# Patient Record
Sex: Female | Born: 1950 | Race: Black or African American | Hispanic: No | Smoking: Current some day smoker
Health system: Southern US, Community
[De-identification: ages and names within clinical notes are randomized; demographics above are authoritative.]

## PROBLEM LIST (undated history)

## (undated) DIAGNOSIS — I639 Cerebral infarction, unspecified: Secondary | ICD-10-CM

## (undated) DIAGNOSIS — F17210 Nicotine dependence, cigarettes, uncomplicated: Secondary | ICD-10-CM

## (undated) DIAGNOSIS — I10 Essential (primary) hypertension: Secondary | ICD-10-CM

## (undated) HISTORY — PX: APPENDECTOMY: SHX54

## (undated) HISTORY — PX: ABDOMINAL HYSTERECTOMY: SHX81

## (undated) HISTORY — DX: Cerebral infarction, unspecified: I63.9

## (undated) HISTORY — PX: TUBAL LIGATION: SHX77

---

## 1999-08-13 ENCOUNTER — Emergency Department (HOSPITAL_COMMUNITY): Admission: EM | Admit: 1999-08-13 | Discharge: 1999-08-13 | Payer: Self-pay | Admitting: Emergency Medicine

## 1999-08-13 ENCOUNTER — Encounter: Payer: Self-pay | Admitting: Emergency Medicine

## 1999-08-16 ENCOUNTER — Observation Stay (HOSPITAL_COMMUNITY): Admission: EM | Admit: 1999-08-16 | Discharge: 1999-08-16 | Payer: Self-pay | Admitting: Emergency Medicine

## 2000-10-23 ENCOUNTER — Emergency Department (HOSPITAL_COMMUNITY): Admission: EM | Admit: 2000-10-23 | Discharge: 2000-10-23 | Payer: Self-pay | Admitting: Emergency Medicine

## 2001-03-11 ENCOUNTER — Encounter: Payer: Self-pay | Admitting: Emergency Medicine

## 2001-03-11 ENCOUNTER — Emergency Department (HOSPITAL_COMMUNITY): Admission: EM | Admit: 2001-03-11 | Discharge: 2001-03-11 | Payer: Self-pay | Admitting: Emergency Medicine

## 2001-07-10 ENCOUNTER — Encounter: Payer: Self-pay | Admitting: *Deleted

## 2001-07-10 ENCOUNTER — Emergency Department (HOSPITAL_COMMUNITY): Admission: EM | Admit: 2001-07-10 | Discharge: 2001-07-10 | Payer: Self-pay | Admitting: *Deleted

## 2003-03-15 ENCOUNTER — Encounter: Payer: Self-pay | Admitting: Urology

## 2003-03-15 ENCOUNTER — Encounter: Admission: RE | Admit: 2003-03-15 | Discharge: 2003-03-15 | Payer: Self-pay | Admitting: Urology

## 2003-04-18 ENCOUNTER — Ambulatory Visit (HOSPITAL_COMMUNITY): Admission: RE | Admit: 2003-04-18 | Discharge: 2003-04-18 | Payer: Self-pay | Admitting: Gastroenterology

## 2003-06-23 ENCOUNTER — Emergency Department (HOSPITAL_COMMUNITY): Admission: EM | Admit: 2003-06-23 | Discharge: 2003-06-23 | Payer: Self-pay | Admitting: Emergency Medicine

## 2006-12-03 ENCOUNTER — Encounter: Admission: RE | Admit: 2006-12-03 | Discharge: 2006-12-03 | Payer: Self-pay | Admitting: Family Medicine

## 2007-12-02 ENCOUNTER — Emergency Department (HOSPITAL_COMMUNITY): Admission: EM | Admit: 2007-12-02 | Discharge: 2007-12-02 | Payer: Self-pay | Admitting: Emergency Medicine

## 2011-03-07 ENCOUNTER — Other Ambulatory Visit: Payer: Self-pay | Admitting: Family Medicine

## 2011-03-07 DIAGNOSIS — Z1231 Encounter for screening mammogram for malignant neoplasm of breast: Secondary | ICD-10-CM

## 2011-03-19 ENCOUNTER — Ambulatory Visit
Admission: RE | Admit: 2011-03-19 | Discharge: 2011-03-19 | Disposition: A | Discharge status: Home / Self Care | Payer: BC Managed Care – PPO | Source: Ambulatory Visit | Attending: Family Medicine | Admitting: Family Medicine

## 2011-03-19 DIAGNOSIS — Z1231 Encounter for screening mammogram for malignant neoplasm of breast: Secondary | ICD-10-CM

## 2011-03-20 ENCOUNTER — Other Ambulatory Visit: Payer: Self-pay | Admitting: Family Medicine

## 2011-03-20 DIAGNOSIS — R928 Other abnormal and inconclusive findings on diagnostic imaging of breast: Secondary | ICD-10-CM

## 2011-03-22 NOTE — Op Note (Signed)
   NAMESHOLANDA, Catherine Floyd                        ACCOUNT NO.:  [id number]   MEDICAL RECORD NO.:  [id number]                   PATIENT TYPE:  AMB   LOCATION:  ENDO                                 FACILITY:  [facility]   PHYSICIAN:  Anselmo Rod, M.D.               DATE OF BIRTH:  Jun 17, 1951   DATE OF PROCEDURE:  04/18/2003  DATE OF DISCHARGE:                                 OPERATIVE REPORT   PROCEDURE:  Colonoscopy.   ENDOSCOPIST:  Anselmo Rod, M.D.   INSTRUMENT USED:  Olympus video colonoscope.   INDICATIONS FOR PROCEDURE:  A 60 year old African-American female with a  history of constipation, abdominal pain, and rectal bleeding off and on for  the last 15 years, rule out colonic polyps, masses, hemorrhoids, etc.   PREPROCEDURE PREPARATION:  Informed consent was procured from the patient.  The patient was fasted for eight hours prior to the procedure and prepped  with a bottle of magnesium citrate and a gallon of GoLYTELY the night prior  to the procedure.   PREPROCEDURE PHYSICAL EXAMINATION:  VITAL SIGNS: Stable.  NECK:  Supple.  CHEST: Clear to auscultation.  S1 and S2 regular.  ABDOMEN:  Soft with normal bowel sounds.   DESCRIPTION OF PROCEDURE:  The patient was placed in the left lateral  decubitus position and sedated with 50 mg of Demerol and 5 mg of Versed  intravenously. Once the patient was adequately sedated and maintained on low  flow oxygen and continuous cardiac monitoring, the Olympus video colonoscope  was advanced into the rectum to the cecum without difficulty.  Prominent  internal hemorrhoids were seen on retroflexion in the rectum. The rest of  the colonic mucosa up to the terminal ileum appeared normal. The appendiceal  orifice and ileocecal valve were clearly visualized and photographed.   IMPRESSION:  Normal colonoscopy up to the terminal ileum except for  prominent inflamed internal hemorrhoids.    RECOMMENDATIONS:  Anusol HC 2.5% suppositories  have been advised one p.r.  q.h.s.  These have been called in to her pharmacy at [facility] on [address].  Outpatient follow-up is advised in the next two weeks for further  recommendations.  Repeat colorectal cancer screening is recommended in the  next five years unless the patient develops any abnormal symptoms in the  interim.                                               Anselmo Rod, M.D.    JNM/MEDQ  D:  04/18/2003  T:  04/19/2003  Job:  [id number]   cc:   Tammy R. Collins [place], M.D.  [address]  [place]  [place] [address]  Fax: [phone]

## 2011-03-22 NOTE — Op Note (Signed)
[place]  Patient:    Catherine Floyd Visit Number: [id number] MRN: [id number]          Service Type: MED Location: [id number] Attending Physician:  Tommy Medal Proc. Date: 08/17/99 Admit Date:  08/16/1999                             Operative Report  INDICATIONS: This 60 year old lady was using a bungie cord which popped back. he metal piece struck her in the left eye causing a corneal laceration.  She was seen with the laceration at [facility] and transferred to [facility] to have this repaired surgically on an outpatient basis.  Otherwise, the right eye was normal and the left eye otherwise appeared intact except for a very significant corneal laceration that is sort of a "V"-shaped and about a total of 1 cm long.  JUSTIFICATION FOR PERFORMING THE PROCEDURE IN AN OUTPATIENT SETTING:  Routine.  JUSTIFICATION FOR OVERNIGHT STAY:  None.  PREOPERATIVE DIAGNOSIS: Corneal laceration of the left eye.  OPERATION PERFORMED:  Repair of left corneal laceration.  SURGEON:  Robert L. Dione Booze, M.D.  ANESTHESIA:  Nurse.  Anesthesia standby with local xylocaine.  DESCRIPTION OF PROCEDURE:  The patient arrived in the operating room at [facility] and was prepped and draped in routine fashion.  A very tiny retrobulbar block of approximately 2 cc was given as was a facial block.  Then % plain xylocaine and Marcaine were used.  After this a lid speculum was positioned and under the operating microscope the eye was carefully examined.  The ophthalmic ______ was injected into the anterior chamber.  In the chamber it seemed to form very nicely.  The pupil was irregular but the iris had not prolapsed.  SInce the laceration was "V"-shaped, it appeared that it would be somewhat difficult to close this.  Each leg of the V was approximately 5 mm long. Initially, a 10-0 nylon suture was  placed in the angle of the V fairly deeply and this caused some distortion but seemed to close the eye fairly well.  After that two additional 10-0 nylon sutures were placed on each leg of the V to close the  wound completely.  Some saline and Miochol were injected into the anterior chamber and the wound held very snugly and the shape of the cornea appeared to be fairly good with minimal distortion.  Thus the closure was felt to be quite adequate.  Subconjunctival Ancef was given and some Polysporin ointment was put in the eye. The eye was then bandaged and the patient went into the recovery room.  FOLLOWUP CARE:  The patient is to be discharged from the recovery room a little  while after the surgery.  She is to call my office to be seen the next day and he eye is to be patched until she is seen.  Further instructions will be given the  next day when the patient is seen for appropriate postoperative drops and care. Attending Physician:  Tommy Medal DD:  08/17/99 TD:  08/19/99 Job: [id number]

## 2011-03-27 ENCOUNTER — Ambulatory Visit
Admission: RE | Admit: 2011-03-27 | Discharge: 2011-03-27 | Disposition: A | Discharge status: Home / Self Care | Payer: BC Managed Care – PPO | Source: Ambulatory Visit | Attending: Family Medicine | Admitting: Family Medicine

## 2011-03-27 DIAGNOSIS — R928 Other abnormal and inconclusive findings on diagnostic imaging of breast: Secondary | ICD-10-CM

## 2011-07-25 LAB — COMPREHENSIVE METABOLIC PANEL
ALT: 33
AST: 17
Albumin: 4
Alkaline Phosphatase: 92
BUN: 6
CO2: 32
Calcium: 9.6
Chloride: 105
Creatinine, Ser: 0.79
GFR calc Af Amer: >60
GFR calc non Af Amer: >60
Glucose, Bld: 106 — ABNORMAL HIGH
Potassium: 2.9 — ABNORMAL LOW
Sodium: 144
Total Bilirubin: 0.5
Total Protein: 7.5

## 2011-07-25 LAB — CBC
HCT: 37.8
Hemoglobin: 13.1
MCHC: 34.6
MCV: 85.1
Platelets: 428 — ABNORMAL HIGH
RBC: 4.44
RDW: 13
WBC: 6

## 2011-07-25 LAB — DIFFERENTIAL
Basophils Absolute: 0
Basophils Relative: 1
Eosinophils Absolute: 0.6
Eosinophils Relative: 10 — ABNORMAL HIGH
Lymphocytes Relative: 35
Lymphs Abs: 2.1
Monocytes Absolute: 0.5
Monocytes Relative: 8
Neutro Abs: 2.8
Neutrophils Relative %: 46

## 2012-05-11 ENCOUNTER — Other Ambulatory Visit: Payer: Self-pay | Admitting: Family Medicine

## 2012-05-11 DIAGNOSIS — Z1231 Encounter for screening mammogram for malignant neoplasm of breast: Secondary | ICD-10-CM

## 2012-05-22 ENCOUNTER — Ambulatory Visit
Admission: RE | Admit: 2012-05-22 | Discharge: 2012-05-22 | Disposition: A | Discharge status: Home / Self Care | Payer: BC Managed Care – PPO | Source: Ambulatory Visit | Attending: Family Medicine | Admitting: Family Medicine

## 2012-05-22 DIAGNOSIS — Z1231 Encounter for screening mammogram for malignant neoplasm of breast: Secondary | ICD-10-CM

## 2013-08-09 ENCOUNTER — Other Ambulatory Visit: Payer: Self-pay | Admitting: Cardiovascular Disease

## 2013-08-10 NOTE — Telephone Encounter (Signed)
Rx was sent to pharmacy electronically. 

## 2014-07-14 ENCOUNTER — Other Ambulatory Visit: Payer: Self-pay

## 2014-07-14 DIAGNOSIS — Z1231 Encounter for screening mammogram for malignant neoplasm of breast: Secondary | ICD-10-CM

## 2014-07-28 ENCOUNTER — Ambulatory Visit
Admission: RE | Admit: 2014-07-28 | Discharge: 2014-07-28 | Disposition: A | Discharge status: Home / Self Care | Payer: BC Managed Care – PPO | Source: Ambulatory Visit

## 2014-07-28 ENCOUNTER — Encounter (INDEPENDENT_AMBULATORY_CARE_PROVIDER_SITE_OTHER): Payer: Self-pay

## 2014-07-28 DIAGNOSIS — Z1231 Encounter for screening mammogram for malignant neoplasm of breast: Secondary | ICD-10-CM

## 2015-06-19 ENCOUNTER — Inpatient Hospital Stay (HOSPITAL_COMMUNITY): Payer: BLUE CROSS/BLUE SHIELD

## 2015-06-19 ENCOUNTER — Inpatient Hospital Stay (HOSPITAL_COMMUNITY)
Admission: EM | Admit: 2015-06-19 | Discharge: 2015-06-22 | DRG: 065 | Disposition: A | Discharge status: Home / Self Care | Payer: BLUE CROSS/BLUE SHIELD | Attending: Internal Medicine | Admitting: Internal Medicine

## 2015-06-19 ENCOUNTER — Emergency Department (HOSPITAL_COMMUNITY): Payer: BLUE CROSS/BLUE SHIELD

## 2015-06-19 ENCOUNTER — Encounter (HOSPITAL_COMMUNITY): Payer: Self-pay | Admitting: Family Medicine

## 2015-06-19 DIAGNOSIS — Z9119 Patient's noncompliance with other medical treatment and regimen: Secondary | ICD-10-CM | POA: Diagnosis present

## 2015-06-19 DIAGNOSIS — Z716 Tobacco abuse counseling: Secondary | ICD-10-CM | POA: Diagnosis present

## 2015-06-19 DIAGNOSIS — I63312 Cerebral infarction due to thrombosis of left middle cerebral artery: Principal | ICD-10-CM | POA: Diagnosis present

## 2015-06-19 DIAGNOSIS — I639 Cerebral infarction, unspecified: Secondary | ICD-10-CM | POA: Diagnosis not present

## 2015-06-19 DIAGNOSIS — E785 Hyperlipidemia, unspecified: Secondary | ICD-10-CM | POA: Diagnosis present

## 2015-06-19 DIAGNOSIS — Z79899 Other long term (current) drug therapy: Secondary | ICD-10-CM | POA: Diagnosis not present

## 2015-06-19 DIAGNOSIS — E876 Hypokalemia: Secondary | ICD-10-CM | POA: Diagnosis present

## 2015-06-19 DIAGNOSIS — I10 Essential (primary) hypertension: Secondary | ICD-10-CM

## 2015-06-19 DIAGNOSIS — R001 Bradycardia, unspecified: Secondary | ICD-10-CM | POA: Diagnosis not present

## 2015-06-19 DIAGNOSIS — F17210 Nicotine dependence, cigarettes, uncomplicated: Secondary | ICD-10-CM | POA: Diagnosis present

## 2015-06-19 DIAGNOSIS — G8191 Hemiplegia, unspecified affecting right dominant side: Secondary | ICD-10-CM | POA: Diagnosis present

## 2015-06-19 DIAGNOSIS — R531 Weakness: Secondary | ICD-10-CM | POA: Diagnosis not present

## 2015-06-19 DIAGNOSIS — Z720 Tobacco use: Secondary | ICD-10-CM

## 2015-06-19 DIAGNOSIS — I739 Peripheral vascular disease, unspecified: Secondary | ICD-10-CM | POA: Diagnosis present

## 2015-06-19 HISTORY — DX: Nicotine dependence, cigarettes, uncomplicated: F17.210

## 2015-06-19 HISTORY — DX: Essential (primary) hypertension: I10

## 2015-06-19 LAB — URINALYSIS, ROUTINE W REFLEX MICROSCOPIC
Bilirubin Urine: NEGATIVE
Glucose, UA: NEGATIVE mg/dL
Ketones, ur: NEGATIVE mg/dL
Leukocytes, UA: NEGATIVE
Nitrite: NEGATIVE
Protein, ur: NEGATIVE mg/dL
Specific Gravity, Urine: 1.011 (ref 1.005–1.030)
Urobilinogen, UA: 1 mg/dL (ref 0.0–1.0)
pH: 6.5 (ref 5.0–8.0)

## 2015-06-19 LAB — RAPID URINE DRUG SCREEN, HOSP PERFORMED
Amphetamines: NOT DETECTED
Barbiturates: NOT DETECTED
Benzodiazepines: NOT DETECTED
Cocaine: NOT DETECTED
Opiates: NOT DETECTED
Tetrahydrocannabinol: NOT DETECTED

## 2015-06-19 LAB — COMPREHENSIVE METABOLIC PANEL
ALT: 12 U/L — ABNORMAL LOW (ref 14–54)
AST: 20 U/L (ref 15–41)
Albumin: 3.8 g/dL (ref 3.5–5.0)
Alkaline Phosphatase: 82 U/L (ref 38–126)
Anion gap: 11 (ref 5–15)
BUN: 8 mg/dL (ref 6–20)
CO2: 30 mmol/L (ref 22–32)
Calcium: 9.6 mg/dL (ref 8.9–10.3)
Chloride: 103 mmol/L (ref 101–111)
Creatinine, Ser: 0.63 mg/dL (ref 0.44–1.00)
GFR calc Af Amer: >60 mL/min (ref >60)
GFR calc non Af Amer: >60 mL/min (ref >60)
Glucose, Bld: 113 mg/dL — ABNORMAL HIGH (ref 65–99)
Potassium: 3.1 mmol/L — ABNORMAL LOW (ref 3.5–5.1)
Sodium: 144 mmol/L (ref 135–145)
Total Bilirubin: 0.3 mg/dL (ref 0.3–1.2)
Total Protein: 6.9 g/dL (ref 6.5–8.1)

## 2015-06-19 LAB — DIFFERENTIAL
Basophils Absolute: 0.1 10*3/uL (ref 0.0–0.1)
Basophils Relative: 1 % (ref 0–1)
Eosinophils Absolute: 0.5 10*3/uL (ref 0.0–0.7)
Eosinophils Relative: 7 % — ABNORMAL HIGH (ref 0–5)
Lymphocytes Relative: 29 % (ref 12–46)
Lymphs Abs: 2.4 10*3/uL (ref 0.7–4.0)
Monocytes Absolute: 0.6 10*3/uL (ref 0.1–1.0)
Monocytes Relative: 8 % (ref 3–12)
Neutro Abs: 4.5 10*3/uL (ref 1.7–7.7)
Neutrophils Relative %: 55 % (ref 43–77)

## 2015-06-19 LAB — CBC
HCT: 40.7 % (ref 36.0–46.0)
Hemoglobin: 13.5 g/dL (ref 12.0–15.0)
MCH: 30.6 pg (ref 26.0–34.0)
MCHC: 33.2 g/dL (ref 30.0–36.0)
MCV: 92.3 fL (ref 78.0–100.0)
Platelets: 343 10*3/uL (ref 150–400)
RBC: 4.41 MIL/uL (ref 3.87–5.11)
RDW: 13.2 % (ref 11.5–15.5)
WBC: 8.1 10*3/uL (ref 4.0–10.5)

## 2015-06-19 LAB — APTT: aPTT: 36 s (ref 24–37)

## 2015-06-19 LAB — I-STAT CHEM 8, ED
BUN: 10 mg/dL (ref 6–20)
Calcium, Ion: 1.16 mmol/L (ref 1.13–1.30)
Chloride: 101 mmol/L (ref 101–111)
Creatinine, Ser: 0.7 mg/dL (ref 0.44–1.00)
Glucose, Bld: 111 mg/dL — ABNORMAL HIGH (ref 65–99)
HCT: 46 % (ref 36.0–46.0)
Hemoglobin: 15.6 g/dL — ABNORMAL HIGH (ref 12.0–15.0)
Potassium: 3.1 mmol/L — ABNORMAL LOW (ref 3.5–5.1)
Sodium: 144 mmol/L (ref 135–145)
TCO2: 30 mmol/L (ref 0–100)

## 2015-06-19 LAB — URINE MICROSCOPIC-ADD ON

## 2015-06-19 LAB — PROTIME-INR
INR: 1.07 (ref 0.00–1.49)
Prothrombin Time: 14.1 s (ref 11.6–15.2)

## 2015-06-19 LAB — I-STAT TROPONIN, ED: Troponin i, poc: 0.01 ng/mL (ref 0.00–0.08)

## 2015-06-19 LAB — MRSA PCR SCREENING: MRSA by PCR: NEGATIVE

## 2015-06-19 MED ORDER — ASPIRIN 300 MG RE SUPP
300.0000 mg | Freq: Every day | RECTAL | Status: DC
  Filled 2015-06-19 (×2): qty 1

## 2015-06-19 MED ORDER — HYDRALAZINE HCL 20 MG/ML IJ SOLN: 10.0000 mg | INTRAMUSCULAR | Status: DC | PRN

## 2015-06-19 MED ORDER — ENOXAPARIN SODIUM 30 MG/0.3ML ~~LOC~~ SOLN
30.0000 mg | SUBCUTANEOUS | Status: DC
  Administered 2015-06-19 – 2015-06-21 (×3): 30 mg via SUBCUTANEOUS
  Filled 2015-06-19 (×4): qty 0.3

## 2015-06-19 MED ORDER — ONDANSETRON HCL 4 MG/2ML IJ SOLN: 4.0000 mg | Freq: Three times a day (TID) | INTRAMUSCULAR | Status: AC | PRN

## 2015-06-19 MED ORDER — ATORVASTATIN CALCIUM 80 MG PO TABS
80.0000 mg | ORAL_TABLET | Freq: Every day | ORAL | Status: DC
  Administered 2015-06-19 – 2015-06-21 (×3): 80 mg via ORAL
  Filled 2015-06-19 (×3): qty 1

## 2015-06-19 MED ORDER — SENNOSIDES-DOCUSATE SODIUM 8.6-50 MG PO TABS
1.0000 | ORAL_TABLET | Freq: Every evening | ORAL | Status: DC | PRN
  Filled 2015-06-19: qty 1

## 2015-06-19 MED ORDER — ACETAMINOPHEN 325 MG PO TABS: 650.0000 mg | ORAL_TABLET | ORAL | Status: DC | PRN

## 2015-06-19 MED ORDER — ACETAMINOPHEN 650 MG RE SUPP: 650.0000 mg | RECTAL | Status: DC | PRN

## 2015-06-19 MED ORDER — ASPIRIN 325 MG PO TABS
325.0000 mg | ORAL_TABLET | Freq: Every day | ORAL | Status: DC
  Administered 2015-06-19 – 2015-06-22 (×4): 325 mg via ORAL
  Filled 2015-06-19 (×4): qty 1

## 2015-06-19 MED ORDER — STROKE: EARLY STAGES OF RECOVERY BOOK
Freq: Once | Status: DC
  Filled 2015-06-19: qty 1

## 2015-06-19 MED ORDER — NICOTINE 21 MG/24HR TD PT24
21.0000 mg | MEDICATED_PATCH | Freq: Every day | TRANSDERMAL | Status: DC
  Administered 2015-06-20 – 2015-06-22 (×3): 21 mg via TRANSDERMAL
  Filled 2015-06-19 (×4): qty 1

## 2015-06-19 NOTE — Consult Note (Signed)
Referring Physician: Short    Chief Complaint: Right sided weakness  HPI: Catherine Floyd is an 64 y.o. RH female who reports that she went to bed last night and was at her baseline.  On awakening this morning she noted right sided weakness and difficulty walking.  She does not describe difficulty with speech or vision.  Symptoms have not worsened.  The patient has been under quite a bit of stress recently and has not been compliant with her antihypertensives.    Date last known well: Date: 06/18/2015 Time last known well: Time: 23:00 tPA Given: No: Outside time window  Past Medical History  Diagnosis Date  . Hypertension   . Cigarette nicotine dependence     Past Surgical History  Procedure Laterality Date  . Abdominal hysterectomy    . Appendectomy    . Tubal ligation      Family History  Problem Relation Age of Onset  . Stroke Father   . Bladder Cancer Mother   . High blood pressure Father   . Diabetes Son    Social History:  reports that she has been smoking Cigarettes.  She has been smoking about 1.00 pack per day. Her smokeless tobacco use includes Chew. She reports that she does not drink alcohol or use illicit drugs.  Allergies:  Allergies  Allergen Reactions  . Penicillins   . Sulfa Antibiotics     Medications: I have reviewed the patient's current medications. Prior to Admission:  Prior to Admission medications   Medication Sig Start Date End Date Taking? Authorizing Provider  Aspirin-Acetaminophen (GOODYS BODY PAIN PO) Take 1 packet by mouth every 8 (eight) hours as needed (pain).   Yes Historical Provider, MD  doxazosin (CARDURA) 4 MG tablet Take 4 mg by mouth daily. 05/18/15  Yes Historical Provider, MD  hydrochlorothiazide (HYDRODIURIL) 12.5 MG tablet Take 12.5 mg by mouth daily. 05/18/15  Yes Historical Provider, MD  metoprolol succinate (TOPROL-XL) 50 MG 24 hr tablet Take 50 mg by mouth daily. 05/18/15  Yes Historical Provider, MD  Multiple Vitamin  (MULTIVITAMIN) tablet Take 1 tablet by mouth daily.   Yes Historical Provider, MD  nisoldipine (SULAR) 20 MG 24 hr tablet Take 20 mg by mouth daily. 05/18/15  Yes Historical Provider, MD    ROS: History obtained from the patient  General ROS: negative for - chills, fatigue, fever, night sweats, weight gain or weight loss Psychological ROS: negative for - behavioral disorder, hallucinations, memory difficulties, mood swings or suicidal ideation Ophthalmic ROS: negative for - blurry vision, double vision, eye pain or loss of vision ENT ROS: negative for - epistaxis, nasal discharge, oral lesions, sore throat, tinnitus or vertigo Allergy and Immunology ROS: negative for - hives or itchy/watery eyes Hematological and Lymphatic ROS: negative for - bleeding problems, bruising or swollen lymph nodes Endocrine ROS: negative for - galactorrhea, hair pattern changes, polydipsia/polyuria or temperature intolerance Respiratory ROS: negative for - cough, hemoptysis, shortness of breath or wheezing Cardiovascular ROS: negative for - chest pain, dyspnea on exertion, edema or irregular heartbeat Gastrointestinal ROS: negative for - abdominal pain, diarrhea, hematemesis, nausea/vomiting or stool incontinence Genito-Urinary ROS: negative for - dysuria, hematuria, incontinence or urinary frequency/urgency Musculoskeletal ROS: negative for - joint swelling or muscular weakness Neurological ROS: as noted in HPI Dermatological ROS: negative for rash and skin lesion changes  Physical Examination: Blood pressure 215/98, pulse 67, temperature 98.7 F (37.1 C), temperature source Oral, resp. rate 19, height 5' 2.5" (1.588 m), weight 61.916 kg (136 lb  8 oz), SpO2 100 %.  Gen: NAD HEENT-  Normocephalic, no lesions, without obvious abnormality.  Normal external eye and conjunctiva.  Normal TM's bilaterally.  Normal auditory canals and external ears. Normal external nose, mucus membranes and septum.  Normal  pharynx. Cardiovascular- S1, S2 normal, pulses palpable throughout   Lungs- chest clear, no wheezing, rales, normal symmetric air entry Abdomen- soft, non-tender; bowel sounds normal; no masses,  no organomegaly Extremities- no edema Lymph-no adenopathy palpable Musculoskeletal-no joint tenderness, deformity or swelling Skin-warm and dry, no hyperpigmentation, vitiligo, or suspicious lesions  Neurological Examination Mental Status: Alert, oriented, thought content appropriate.  Speech fluent without evidence of aphasia.  Able to follow 3 step commands without difficulty. Cranial Nerves: II: Discs flat bilaterally; Visual fields grossly normal, pupils equal, round, reactive to light and accommodation III,IV, VI: ptosis not present, extra-ocular motions intact bilaterally V,VII: smile symmetric, facial light touch sensation normal bilaterally VIII: hearing normal bilaterally IX,X: gag reflex present XI: bilateral shoulder shrug XII: midline tongue extension Motor: Right : Upper extremity   5-/5    Left:     Upper extremity   5/5  Lower extremity   5-/5     Lower extremity   5/5 Tone and bulk:normal tone throughout; no atrophy noted Sensory: Pinprick and light touch intact throughout, bilaterally Deep Tendon Reflexes: 2+ and symmetric with absent AJ's bilaterally Plantars: Right: downgoing   Left: downgoing Cerebellar: Dysmetria with finger-to-nose and heel-to-shin testing on the right Gait: not tested due to safety concerns   Laboratory Studies:  Basic Metabolic Panel:  Recent Labs Lab 06/19/15 1308 06/19/15 1323  NA 144 144  K 3.1* 3.1*  CL 103 101  CO2 30  --   GLUCOSE 113* 111*  BUN 8 10  CREATININE 0.63 0.70  CALCIUM 9.6  --     Liver Function Tests:  Recent Labs Lab 06/19/15 1308  AST 20  ALT 12*  ALKPHOS 82  BILITOT 0.3  PROT 6.9  ALBUMIN 3.8   No results for input(s): LIPASE, AMYLASE in the last 168 hours. No results for input(s): AMMONIA in the  last 168 hours.  CBC:  Recent Labs Lab 06/19/15 1308 06/19/15 1323  WBC 8.1  --   NEUTROABS 4.5  --   HGB 13.5 15.6*  HCT 40.7 46.0  MCV 92.3  --   PLT 343  --     Cardiac Enzymes: No results for input(s): CKTOTAL, CKMB, CKMBINDEX, TROPONINI in the last 168 hours.  BNP: Invalid input(s): POCBNP  CBG: No results for input(s): GLUCAP in the last 168 hours.  Microbiology: No results found for this or any previous visit.  Coagulation Studies:  Recent Labs  06/19/15 1308  LABPROT 14.1  INR 1.07    Urinalysis:  Recent Labs Lab 06/19/15 1615  COLORURINE YELLOW  LABSPEC 1.011  PHURINE 6.5  GLUCOSEU NEGATIVE  HGBUR MODERATE*  BILIRUBINUR NEGATIVE  KETONESUR NEGATIVE  PROTEINUR NEGATIVE  UROBILINOGEN 1.0  NITRITE NEGATIVE  LEUKOCYTESUR NEGATIVE    Lipid Panel: No results found for: CHOL, TRIG, HDL, CHOLHDL, VLDL, LDLCALC  HgbA1C: No results found for: HGBA1C  Urine Drug Screen:     Component Value Date/Time   LABOPIA NONE DETECTED 06/19/2015 1615   COCAINSCRNUR NONE DETECTED 06/19/2015 1615   LABBENZ NONE DETECTED 06/19/2015 1615   AMPHETMU NONE DETECTED 06/19/2015 1615   THCU NONE DETECTED 06/19/2015 1615   LABBARB NONE DETECTED 06/19/2015 1615    Alcohol Level: No results for input(s): ETH in the last 168  hours.  Other results: EKG: normal sinus rhythm at 75 bpm.  Imaging: Ct Head Wo Contrast  06/19/2015   CLINICAL DATA:  One day history of right-sided weakness and gait disturbance  EXAM: CT HEAD WITHOUT CONTRAST  TECHNIQUE: Contiguous axial images were obtained from the base of the skull through the vertex without intravenous contrast.  COMPARISON:  None.  FINDINGS: The ventricles are normal in size and configuration. There is no intracranial mass, hemorrhage, extra-axial fluid collection, or midline shift. Gray-white compartments appear normal. No acute infarct evident. The bony calvarium appears intact. The mastoid air cells are clear. There is  mild mucosal thickening in multiple ethmoid air cells bilaterally.  IMPRESSION: Mild ethmoid sinus disease. No intracranial mass, hemorrhage, or extra-axial fluid collection. No focal gray - white compartment lesions/acute appearing infarct.   Electronically Signed   By: Bretta Bang III M.D.   On: 06/19/2015 15:20    Assessment: 64 y.o. female presenting with right dysmetria and mild right sided weakness.  Patient hypertensive.  Not compliant with antihypertensive medications.  Posterior circulation infarct suspected.  Head CT independently reviewed and shows no acute changes.  Further work up recommended.    Stroke Risk Factors - hypertension and smoking  Plan: 1. HgbA1c, fasting lipid panel 2. MRI, MRA  of the brain without contrast 3. PT consult, OT consult, Speech consult 4. Echocardiogram 5. Carotid dopplers 6. Prophylactic therapy-Antiplatelet med: Aspirin - dose 325mg  daily 7. NPO until RN stroke swallow screen 8. Telemetry monitoring 9. Frequent neuro checks 10. Smoking cessation counseling   Thana Farr, MD [facility] [phone] 06/19/2015, 5:43 PM

## 2015-06-19 NOTE — ED Provider Notes (Signed)
CSN: [id number]     Arrival date & time 06/19/15  1247 History   First MD Initiated Contact with Patient 06/19/15 1444     Chief Complaint  Patient presents with  . Gait Problem  . Extremity Weakness     (Consider location/radiation/quality/duration/timing/severity/associated sxs/prior Treatment) HPI   Patient is a 64 year old female, current smoker with approximately 25-pack-year hx and PMHx of hypertension, presents to the ER with right-sided weakness causing difficulty walking.  She denied any symptoms last night when she went to bed at approximately 11 PM, however upon waking at approximately 9:30 AM she had weakness on her right side, worse in her right leg, and difficulty with her coordination and walking. She checked her blood pressure and found that it was high, reported systolic 185.  She subsequently presented to the emergency department for evaluation. Upon presentation, the patient continues to have right-sided weakness with difficulty controlling her right leg, but she denies any decreased sensation, numbness or tingling, slurred speech, difficulty speaking or swallowing.  She denies CP, SOB, HA, Abdominal pain, fever,    Past Medical History  Diagnosis Date  . Hypertension    Past Surgical History  Procedure Laterality Date  . Abdominal hysterectomy    . Appendectomy     History reviewed. No pertinent family history. Social History  Substance Use Topics  . Smoking status: Current Every Day Smoker  . Smokeless tobacco: None  . Alcohol Use: No   OB History    No data available     Review of Systems  Constitutional: Negative.   HENT: Negative.   Respiratory: Negative.   Cardiovascular: Negative.   Gastrointestinal: Negative.   Endocrine: Negative.   Genitourinary: Negative.   Musculoskeletal: Negative.   Skin: Negative.       Allergies  Penicillins and Sulfa antibiotics  Home Medications   Prior to Admission medications   Medication Sig Start Date  End Date Taking? Authorizing Provider  pantoprazole (PROTONIX) 40 MG tablet TAKE ONE TABLET BY MOUTH ONCE DAILY. 08/09/13   Runell Gess, MD   BP 161/80 mmHg  Pulse 81  Temp(Src) 98.6 F (37 C) (Oral)  Resp 17  Ht 5' 2.5" (1.588 m)  Wt 136 lb 8 oz (61.916 kg)  BMI 24.55 kg/m2  SpO2 100% Physical Exam  Constitutional: She is oriented to person, place, and time. She appears well-developed and well-nourished. No distress.  HENT:  Head: Normocephalic and atraumatic.  Nose: Nose normal.  Mouth/Throat: Oropharynx is clear and moist. No oropharyngeal exudate.  Eyes: Conjunctivae and EOM are normal. Pupils are equal, round, and reactive to light. Right eye exhibits no discharge. Left eye exhibits no discharge. No scleral icterus.  Neck: Normal range of motion. No JVD present. No tracheal deviation present. No thyromegaly present.  Cardiovascular: Normal rate, regular rhythm, normal heart sounds and intact distal pulses.  Exam reveals no gallop and no friction rub.   No murmur heard. No carotid bruits, pulses symmetrical radial 2+, PT 2+, no LE edema  Pulmonary/Chest: Effort normal and breath sounds normal. No respiratory distress. She has no wheezes. She has no rales. She exhibits no tenderness.  Abdominal: Soft. Bowel sounds are normal. She exhibits no distension and no mass. There is no tenderness. There is no rebound and no guarding.  Musculoskeletal: She exhibits no edema or tenderness.  Lymphadenopathy:    She has no cervical adenopathy.  Neurological: She is alert and oriented to person, place, and time. Coordination abnormal.  Speech is clear and  goal oriented, follows commands Cranial nerves: slight facial asymmetry, droop on right, otherwise CN grossly intact  Decreased grip strength right, decreased dorsiflexion and plantar flexion strength on right, equal straight leg raise strength Sensation normal to light and sharp touch Dysmetria with finger-nose, decreased coordination  with heel-shin. Romberg, slight pronator drift on right Gait - dragging right root, shortened steps    Skin: Skin is warm and dry. No rash noted. She is not diaphoretic. No erythema. No pallor.  Psychiatric: She has a normal mood and affect. Her behavior is normal. Judgment and thought content normal.  Nursing note and vitals reviewed.   ED Course  Procedures (including critical care time) Labs Review Labs Reviewed  DIFFERENTIAL - Abnormal; Notable for the following:    Eosinophils Relative 7 (*)    All other components within normal limits  COMPREHENSIVE METABOLIC PANEL - Abnormal; Notable for the following:    Potassium 3.1 (*)    Glucose, Bld 113 (*)    ALT 12 (*)    All other components within normal limits  I-STAT CHEM 8, ED - Abnormal; Notable for the following:    Potassium 3.1 (*)    Glucose, Bld 111 (*)    Hemoglobin 15.6 (*)    All other components within normal limits  PROTIME-INR  APTT  CBC  I-STAT TROPOININ, ED  CBG MONITORING, ED    Imaging Review No results found. I, Danelle Berry, personally reviewed and evaluated these images and lab results as part of my medical decision-making.   EKG Interpretation None      MDM   Final diagnoses:  None    CC: gait problem/right side weakness  Work up for stroke - suspicious with slight rt side facial droop, dysmetria, ataxia, decreased grip strength on rt, subtle pronator drift on rt Pt was hypertensive upon arrival to ER - elevated after discussing probably diagnosis of stroke, for now will wait to see how BP trends  Head CT negative [facility] to admit, Neuro consulting Admission to stepdown for stroke, HTN, hypokalemia  The patient was discussed with and seen by Dr. Lowella Petties, PA-C 06/22/15 0104  Danelle Berry, PA-C 06/22/15 1610  Eber Hong, MD 06/23/15 [phone]

## 2015-06-19 NOTE — Care Management Note (Addendum)
Case Management Note  Patient Details  Name: Catherine Floyd MRN: [id number] Date of Birth: [birth year]  Subjective/Objective:            Presents with R sided weakness , difficulty walking - R/O CVA . HX of htn, smoker , medication noncompliance. Independent with ADLS prior to admit. PCP is Dr. Dalene Carrow.   Action/Plan: Discharge Planning  Expected Discharge Date:       unknown           Expected Discharge Plan:     In-House Referral:     Discharge planning Services     Post Acute Care Choice:    Choice offered to:     DME Arranged:    DME Agency:     HH Arranged:    HH Agency:     Status of Service:     Medicare Important Message Given:    Date Medicare IM Given:    Medicare IM give by:    Date Additional Medicare IM Given:    Additional Medicare Important Message give by:     If discussed at Long Length of Stay Meetings, dates discussed:    Additional Comments: [name] (Daughter)  [phone]  [name], [place] [phone] 06/19/2015, 8:30 PM

## 2015-06-19 NOTE — H&P (Addendum)
[facility] History and Physical  Catherine Floyd ZOX:[id number] DOB: April 21, 1951 DOA: 06/19/2015  Referring physician:  Eber Hong PCP:  [name], MD   Chief Complaint:  Weakness on the right side  HPI:  The patient is a 64 y.o. year-old female with history of hypertension who presents with right arm and leg weakness.  The patient was last at their baseline health yesterday when she went to bed.  This morning when she awoke around 9 AM, her right arm and leg would not work normally. She tried to walk to the bathroom but had to use all of her strength to remain upright. She did not fall but felt strongly that she would fall. She denies room tilting sensation, blurred vision, headache, slurred speech, numbness, tingling. She denied any face weakness. She denies recent fevers, chills, nausea, vomiting, diarrhea, cough, dysuria, stress, new medication changes. She has had high blood pressure for almost 20 years which she states is normally well controlled when she goes to clinic and has been taking her medications. She states she took her medications this morning for blood pressure but she had not taken them for the last 3 days. She is a smoker and smokes one pack per day. She denies history of high cholesterol or diabetes.    In the emergency department, her vital signs were notable for blood pressure of 213/93 which trended down to 193/84 without intervention, heart rate 50s to 80s, breathing comfortably on room air. Her labs were notable for mild hypokalemia with potassium 3.1. Her head CT demonstrated mild ethmoid sinus disease but no mass, hemorrhage, or evidence of acute infarct.  Urinalysis and urine drug screen are pending.  She is being admitted for stroke evaluation to stepdown secondary to persistently high blood pressures in the emergency department.  Review of Systems:  General:  Denies fevers, chills, weight loss or gain HEENT:  Denies changes to hearing and vision,  rhinorrhea, sinus congestion, sore throat CV:  Denies chest pain and palpitations, lower extremity edema.  PULM:  Denies SOB, wheezing, cough.   GI:  Denies nausea, vomiting, constipation, diarrhea.   GU:  Denies dysuria, frequency, urgency ENDO:  Denies polyuria, polydipsia.   HEME:  Denies hematemesis, blood in stools, melena, abnormal bruising or bleeding.  LYMPH:  Denies lymphadenopathy.   MSK:  Denies arthralgias, myalgias.   DERM:  Denies skin rash or ulcer.   NEURO:  Per history of present illness  PSYCH:  Denies anxiety and depression.    Past Medical History  Diagnosis Date  . Hypertension   . Cigarette nicotine dependence    Past Surgical History  Procedure Laterality Date  . Abdominal hysterectomy    . Appendectomy    . Tubal ligation     Social History:  reports that she has been smoking Cigarettes.  She has been smoking about 1.00 pack per day. Her smokeless tobacco use includes Chew. She reports that she does not drink alcohol or use illicit drugs. Lives alone, has family in the area.  Does not use assist device.  Right handed  Allergies  Allergen Reactions  . Penicillins   . Sulfa Antibiotics     Family History  Problem Relation Age of Onset  . Stroke Father   . Bladder Cancer Mother   . High blood pressure Father   . Diabetes Son      Prior to Admission medications   Medication Sig Start Date End Date Taking? Authorizing Provider  Aspirin-Acetaminophen (GOODYS BODY PAIN PO)  Take 1 packet by mouth every 8 (eight) hours as needed (pain).   Yes Historical Provider, MD  doxazosin (CARDURA) 4 MG tablet Take 4 mg by mouth daily. 05/18/15  Yes Historical Provider, MD  hydrochlorothiazide (HYDRODIURIL) 12.5 MG tablet Take 12.5 mg by mouth daily. 05/18/15  Yes Historical Provider, MD  metoprolol succinate (TOPROL-XL) 50 MG 24 hr tablet Take 50 mg by mouth daily. 05/18/15  Yes Historical Provider, MD  Multiple Vitamin (MULTIVITAMIN) tablet Take 1 tablet by mouth  daily.   Yes Historical Provider, MD  nisoldipine (SULAR) 20 MG 24 hr tablet Take 20 mg by mouth daily. 05/18/15  Yes Historical Provider, MD  pantoprazole (PROTONIX) 40 MG tablet TAKE ONE TABLET BY MOUTH ONCE DAILY. Patient not taking: Reported on 06/19/2015 08/09/13   Runell Gess, MD   Physical Exam: Filed Vitals:   06/19/15 1730 06/19/15 1745 06/19/15 1800 06/19/15 1815  BP: 213/87 209/88 199/89 208/90  Pulse: 58 57 55 55  Temp:      TempSrc:      Resp: 16 15 16 16   Height:      Weight:      SpO2: 98% 99% 99% 97%     General:  Adult female, no acute distress, no obvious facial droop or speech difficulties  Eyes:  PERRL, anicteric, non-injected.  ENT:  Nares clear.  OP clear, non-erythematous without plaques or exudates.  MMM.  Neck:  Supple without TM or JVD.    Lymph:  No cervical, supraclavicular, or submandibular LAD.  Cardiovascular:  RRR, normal S1, S2, possible S4 gallop.  2+ pulses, warm extremities  Respiratory:  CTA bilaterally without increased WOB.  Abdomen:  NABS.  Soft, ND/NT.    Skin:  No rashes or focal lesions.  Musculoskeletal:  Normal bulk and tone.  No LE edema.  Psychiatric:  A & O x 4.  Appropriate affect.  Neurologic:  CN 3-12 intact.  5/5 strength.  Sensation intact.  Dysmetria with the right upper extremity and right lower extremity.    Labs on Admission:  Basic Metabolic Panel:  Recent Labs Lab 06/19/15 1308 06/19/15 1323  NA 144 144  K 3.1* 3.1*  CL 103 101  CO2 30  --   GLUCOSE 113* 111*  BUN 8 10  CREATININE 0.63 0.70  CALCIUM 9.6  --    Liver Function Tests:  Recent Labs Lab 06/19/15 1308  AST 20  ALT 12*  ALKPHOS 82  BILITOT 0.3  PROT 6.9  ALBUMIN 3.8   No results for input(s): LIPASE, AMYLASE in the last 168 hours. No results for input(s): AMMONIA in the last 168 hours. CBC:  Recent Labs Lab 06/19/15 1308 06/19/15 1323  WBC 8.1  --   NEUTROABS 4.5  --   HGB 13.5 15.6*  HCT 40.7 46.0  MCV 92.3  --    PLT 343  --    Cardiac Enzymes: No results for input(s): CKTOTAL, CKMB, CKMBINDEX, TROPONINI in the last 168 hours.  BNP (last 3 results) No results for input(s): BNP in the last 8760 hours.  ProBNP (last 3 results) No results for input(s): PROBNP in the last 8760 hours.  CBG: No results for input(s): GLUCAP in the last 168 hours.  Radiological Exams on Admission: Ct Head Wo Contrast  06/19/2015   CLINICAL DATA:  One day history of right-sided weakness and gait disturbance  EXAM: CT HEAD WITHOUT CONTRAST  TECHNIQUE: Contiguous axial images were obtained from the base of the skull through the vertex  without intravenous contrast.  COMPARISON:  None.  FINDINGS: The ventricles are normal in size and configuration. There is no intracranial mass, hemorrhage, extra-axial fluid collection, or midline shift. Gray-white compartments appear normal. No acute infarct evident. The bony calvarium appears intact. The mastoid air cells are clear. There is mild mucosal thickening in multiple ethmoid air cells bilaterally.  IMPRESSION: Mild ethmoid sinus disease. No intracranial mass, hemorrhage, or extra-axial fluid collection. No focal gray - white compartment lesions/acute appearing infarct.   Electronically Signed   By: Bretta Bang III M.D.   On: 06/19/2015 15:20    EKG: Independently reviewed. NSR  Assessment/Plan Principal Problem:   Stroke Active Problems:   Essential hypertension   Hypokalemia   Tobacco abuse  ---  TIA or possible stroke with residual right deficits.  Head CT demonstrated no evidence of acute infarct.   -  Admit to telemetry  -  q4h neuro checks -  MRI brain/MRA head -  Carotid duplex -  ECHO -  Aspirin 325mg  daily -  Lipid panel  -  Hemoglobin A1c -  PT/OT/SLP  -  Neurology consult pending  -  Urine drug screen neg, urinalysis neg -   chest x-ray are pending  Essential hypertension, with very elevated blood pressures -  For now we will allow permissive  hypertension to 180s/190s systolic since she is 24 hours out from when her symptoms started  Hypokalemia, -  Oral potassium repletion  Tobacco abuse with withdrawal -  Offered nicotine patch -  Counseled cessation  Diet:  Healthy heart Access:  PIV IVF:  Off Proph: Lovenox  Code Status: partial Family Communication: patient alone Disposition Plan: Admit to stepdown  Time spent: 60 min Renae Fickle [facility] Pager [phone]  If 7PM-7AM, please contact night-coverage [email or web address] Password [facility] 06/19/2015, 7:03 PM

## 2015-06-19 NOTE — Progress Notes (Signed)
UR COMPLETED  

## 2015-06-19 NOTE — ED Provider Notes (Signed)
The patient is a 63 year old female, she has a history of hypertension, yesterday she was able to walk long distances while she was at another hospital visiting her mother. This morning she woke up at 9:00 and found that she had difficulty walking because of right leg weakness and noticed right arm weakness as well. She denies any difficulty with speech or vision. On exam the patient has a slight asymmetry of her right arm and right leg compared to the left side. She is mildly weak on the right side. She has some difficulty with finger-nose-finger on the right as well as with heel shin testing on the right. She has normal speech, cranial nerves III through XII appear normal, no carotid bruits, no atrial fibrillation, soft abdomen, no peripheral edema. The patient will need to have further stroke evaluation, she will need to be admitted to the hospital for further stroke workup, we'll consult with neurology. Unfortunately she is outside the window for thrombolytic therapy as she did awake with the symptoms this morning at 9:00. She was last known to be normal last night before going to bed.  I discussed care with Dr. Thad Ranger of the hospitalist neurology service, they will see the patient in consultation, she will be admitted to the hospitalist.   EKG Interpretation  Date/Time:  Monday June 19 2015 12:53:49 EDT Ventricular Rate:  75 PR Interval:  168 QRS Duration: 88 QT Interval:  410 QTC Calculation: 457 R Axis:   9 Text Interpretation:  Normal sinus rhythm Possible Left atrial enlargement Abnormal ECG No old tracing to compare Confirmed by Teddy Rebstock  MD, Lacara Dunsworth ([id number]) on 06/19/2015 2:58:40 PM        Medical screening examination/treatment/procedure(s) were conducted as a shared visit with non-physician practitioner(s) and myself.  I personally evaluated the patient during the encounter.  Clinical Impression:   Final diagnoses:  Stroke         Eber Hong, MD 06/23/15 [phone]

## 2015-06-19 NOTE — ED Notes (Signed)
Pt sts that she woke up this am with right sided weakness and abnormal gait. denies pain.

## 2015-06-20 ENCOUNTER — Inpatient Hospital Stay (HOSPITAL_COMMUNITY): Payer: BLUE CROSS/BLUE SHIELD

## 2015-06-20 DIAGNOSIS — I10 Essential (primary) hypertension: Secondary | ICD-10-CM

## 2015-06-20 DIAGNOSIS — E876 Hypokalemia: Secondary | ICD-10-CM

## 2015-06-20 DIAGNOSIS — I63312 Cerebral infarction due to thrombosis of left middle cerebral artery: Principal | ICD-10-CM | POA: Insufficient documentation

## 2015-06-20 LAB — LIPID PANEL
Cholesterol: 112 mg/dL (ref 0–200)
HDL: 31 mg/dL — ABNORMAL LOW (ref >40)
LDL Cholesterol: 63 mg/dL (ref 0–99)
Total CHOL/HDL Ratio: 3.6 {ratio}
Triglycerides: 90 mg/dL (ref <150)
VLDL: 18 mg/dL (ref 0–40)

## 2015-06-20 MED ORDER — ONDANSETRON HCL 4 MG/2ML IJ SOLN: 4.0000 mg | Freq: Three times a day (TID) | INTRAMUSCULAR | Status: AC | PRN

## 2015-06-20 MED ORDER — DOXAZOSIN MESYLATE 4 MG PO TABS
4.0000 mg | ORAL_TABLET | Freq: Every day | ORAL | Status: DC
  Administered 2015-06-20 – 2015-06-22 (×3): 4 mg via ORAL
  Filled 2015-06-20 (×3): qty 1

## 2015-06-20 MED ORDER — ADULT MULTIVITAMIN W/MINERALS CH
1.0000 | ORAL_TABLET | Freq: Every day | ORAL | Status: DC
  Administered 2015-06-20 – 2015-06-22 (×3): 1 via ORAL
  Filled 2015-06-20 (×3): qty 1

## 2015-06-20 MED ORDER — AMLODIPINE BESYLATE 5 MG PO TABS
5.0000 mg | ORAL_TABLET | Freq: Every day | ORAL | Status: DC
  Administered 2015-06-20 – 2015-06-22 (×3): 5 mg via ORAL
  Filled 2015-06-20 (×3): qty 1

## 2015-06-20 MED ORDER — HYDROCHLOROTHIAZIDE 25 MG PO TABS
12.5000 mg | ORAL_TABLET | Freq: Every day | ORAL | Status: DC
  Administered 2015-06-20 – 2015-06-22 (×3): 12.5 mg via ORAL
  Filled 2015-06-20 (×3): qty 1

## 2015-06-20 NOTE — Evaluation (Signed)
Physical Therapy Evaluation Patient Details Name: Catherine Floyd MRN: [id number] DOB: Apr 02, 1951 Today's Date: 06/20/2015   History of Present Illness  64 y.o. female presenting with right dysmetria and mild right sided weakness. Patient hypertensive. Not compliant with antihypertensive medications. Posterior circulation infarct suspected. MRI shows Acute subcentimeter infarct posterior limb of the LEFT   Clinical Impression  Pt admitted with/for right sided weakness and dysmetria, MRI showing infarct Left post limb..  Pt currently limited functionally due to the problems listed below.  (see problems list.)  Pt will benefit from PT to maximize function and safety to be able to get home safely with available assist of family .     Follow Up Recommendations Outpatient PT;Supervision for mobility/OOB    Equipment Recommendations  None recommended by PT (TBA)    Recommendations for Other Services       Precautions / Restrictions Precautions Precautions: Fall      Mobility  Bed Mobility               General bed mobility comments: already up on EOB  Transfers Overall transfer level: Needs assistance   Transfers: Sit to/from Stand Sit to Stand: Min guard         General transfer comment: cues for hand placement; guard for weakness R LE  Ambulation/Gait Ambulation/Gait assistance: Mod assist Ambulation Distance (Feet): 80 Feet Assistive device: 1 person hand held assist Gait Pattern/deviations: Step-through pattern;Decreased step length - left;Decreased stance time - right;Decreased stride length (paretic gait)   Gait velocity interpretation: Below normal speed for age/gender General Gait Details: paretic in nature with less time spent w/b on the R with shorter left step.  Stairs            Wheelchair Mobility    Modified Rankin (Stroke Patients Only) Modified Rankin (Stroke Patients Only) Pre-Morbid Rankin Score: No symptoms Modified Rankin:  Moderately severe disability     Balance Overall balance assessment: Needs assistance Sitting-balance support: No upper extremity supported Sitting balance-Leahy Scale: Good     Standing balance support: Single extremity supported Standing balance-Leahy Scale: Poor                               Pertinent Vitals/Pain Pain Assessment: No/denies pain    Home Living Family/patient expects to be discharged to:: Private residence Living Arrangements: Spouse/significant other Available Help at Discharge: Friend(s);Family;Other (Comment) (could get family assist as needed) Type of Home: House Home Access: Stairs to enter Entrance Stairs-Rails: Doctor, general practice of Steps: 2 Home Layout: One level Home Equipment: None      Prior Function Level of Independence: Independent               Hand Dominance        Extremity/Trunk Assessment   Upper Extremity Assessment: Defer to OT evaluation (mild dysmetria and mild weakness)           Lower Extremity Assessment: Overall WFL for tasks assessed;RLE deficits/detail RLE Deficits / Details: mild proximal weakness; decreased confidence accepting  weight on R LE       Communication   Communication: No difficulties  Cognition Arousal/Alertness: Awake/alert Behavior During Therapy: WFL for tasks assessed/performed Overall Cognitive Status: Within Functional Limits for tasks assessed                      General Comments      Exercises  Assessment/Plan    PT Assessment Patient needs continued PT services  PT Diagnosis Abnormality of gait;Other (comment) (hemiparetic gait)   PT Problem List Decreased strength;Decreased balance;Decreased mobility;Decreased coordination;Decreased knowledge of use of DME;Impaired sensation  PT Treatment Interventions DME instruction;Gait training;Stair training;Functional mobility training;Therapeutic activities;Balance training;Neuromuscular  re-education;Patient/family education   PT Goals (Current goals can be found in the Care Plan section) Acute Rehab PT Goals Patient Stated Goal: get home PT Goal Formulation: With patient Time For Goal Achievement: 06/27/15 Potential to Achieve Goals: Good    Frequency Min 3X/week   Barriers to discharge        Co-evaluation               End of Session   Activity Tolerance: Patient tolerated treatment well;Patient limited by fatigue Patient left: in chair;with call bell/phone within reach;with family/visitor present Nurse Communication: Mobility status         Time: 1435-1455 PT Time Calculation (min) (ACUTE ONLY): 20 min   Charges:   PT Evaluation $Initial PT Evaluation Tier I: 1 Procedure     PT G Codes:        Jasraj Lappe, Eliseo Gum 06/20/2015, 3:33 PM  06/20/2015  Weiser Bing, PT [phone]  (pager)

## 2015-06-20 NOTE — Research (Deleted)
STROKE AF Informed Consent   Subject Name: Catherine Floyd  Subject met inclusion and exclusion criteria.  The informed consent form, study requirements and expectations were reviewed with the subject and questions and concerns were addressed prior to the signing of the consent form.  The subject verbalized understanding of the trail requirements.  The subject agreed to participate in the STROKE AF trial and signed the informed consent.  The informed consent was obtained prior to performance of any protocol-specific procedures for the subject.  A copy of the signed informed consent was given to the subject and a copy was placed in the subject's medical record.  Apollo Timothy 06/20/2015, 5:00 PM

## 2015-06-20 NOTE — Evaluation (Signed)
Speech Language Pathology Evaluation Patient Details Name: Catherine Floyd MRN: [id number] DOB: October 04, 1951 Today's Date: 06/20/2015 Time:  -     Problem List:  Patient Active Problem List   Diagnosis Date Noted  . Stroke 06/19/2015  . Essential hypertension 06/19/2015  . Hypokalemia 06/19/2015  . Tobacco abuse 06/19/2015   Past Medical History:  Past Medical History  Diagnosis Date  . Hypertension   . Cigarette nicotine dependence    Past Surgical History:  Past Surgical History  Procedure Laterality Date  . Abdominal hysterectomy    . Appendectomy    . Tubal ligation     HPI:  64 y.o. female presenting with right dysmetria and mild right sided weakness. Patient hypertensive. Not compliant with antihypertensive medications. Posterior circulation infarct suspected. MRI shows Acute subcentimeter infarct posterior limb of the LEFT   Assessment / Plan / Recommendation Clinical Impression  Pt demonstrates cognitive lingusitic function WNL. No SLP f/u needed will sign off.     SLP Assessment  Patient does not need any further Speech Lanaguage Pathology Services    Follow Up Recommendations       Frequency and Duration        Pertinent Vitals/Pain     SLP Goals     SLP Evaluation Prior Functioning  Cognitive/Linguistic Baseline: Within functional limits   Cognition  Overall Cognitive Status: Within Functional Limits for tasks assessed Orientation Level: Oriented X4    Comprehension  Auditory Comprehension Overall Auditory Comprehension: Appears within functional limits for tasks assessed    Expression Verbal Expression Overall Verbal Expression: Appears within functional limits for tasks assessed   Oral / Motor Oral Motor/Sensory Function Overall Oral Motor/Sensory Function: Appears within functional limits for tasks assessed Motor Speech Overall Motor Speech: Appears within functional limits for tasks assessed   GO    Harlon Ditty, MA CCC-SLP  [phone]  [name] 06/20/2015, 11:47 AM

## 2015-06-20 NOTE — Progress Notes (Signed)
[facility] PROGRESS NOTE  CARISMA TROUPE ZOX:[id number] DOB: 1950-11-19 DOA: 06/19/2015 PCP: [name], MD  Brief Summary  The patient is a 64 y.o. year-old female with history of hypertension who presented with right arm and leg weakness.She awoke around 9 AM on the date of admission and her right arm and leg would not work normally. She tried to walk to the bathroom but had to use all of her strength to remain upright. She did not fall but felt strongly that she would fall. She denied blurred vision, headache, slurred speech, numbness, tingling, face weakness.  She is a smoker and smokes one pack per day. She denies history of high cholesterol or diabetes. In the emergency department, her vital signs were notable for blood pressure of 213/93 which trended down to 193/84 without intervention, heart rate 50s to 80s, breathing comfortably on room air. Her labs were notable for mild hypokalemia with potassium 3.1. Her head CT demonstrated mild ethmoid sinus disease but no mass, hemorrhage, or evidence of acute infarct.   Assessment/Plan  Acute subcentimeter infract posterior limb of the left internal capsule with residual right deficits. MRA demonstrated accessory LEFT MCA with focal high grade stenosis of the distal left MCA M2 segment and focal high grade stenosis of left M2 segment.  She also had multiple stenoses of the posterior circulation including focal high grade stenosis LEFT P3 segment.   -  Telemetry demonstrated normal sinus rhythm.  -  Echocardiogram and carotid duplex are pending. - Continue Aspirin  daily - Started on high-dose statin - Hemoglobin A1c pending  - PT/OT/SLP pending - Neurology consult appreciated - Urine drug screen neg, urinalysis neg -chest x-ray negative  Essential hypertension, blood pressure is trending down -  She has mild bradycardia despite not taking her calcium channel blocker or her beta blocker so will not resume these  medications for now -  Resume doxazosin and start Norvasc  Hypokalemia, - Oral potassium repletion  Tobacco abuse with withdrawal - continue nicotine patch - Counseled cessation   Diet: Healthy heart Access: PIV IVF: Off Proph: Lovenox  Code Status: partial Family Communication: patient alone Disposition Plan: transfer to telemetry  Consultants:  Neurology  Procedures:  CT head  Chest x-ray  MRI brain  MRA brain  Echocardiogram  Antibiotics:  None   HPI/Subjective:  Patient states that she still has bad coordination with her right arm and her right leg. She denies new focal numbness, tingling, weakness, slurred speech, blurred vision or confusion    Objective: Filed Vitals:   06/20/15 0500 06/20/15 0742 06/20/15 0750 06/20/15 1111  BP: 138/68 163/74 151/82 135/91  Pulse: 49 58  54  Temp:  98.2 F (36.8 C)  98.2 F (36.8 C)  TempSrc:  Oral Oral Oral  Resp: Height:      Weight:      SpO2: 94% 97%  100%    Intake/Output Summary (Last 24 hours) at 06/20/15 1318 Last data filed at 06/20/15 1000  Gross per 24 hour  Intake    240 ml  Output    450 ml  Net   -210 ml   Filed Weights   06/19/15 1257 06/19/15 1900  Weight: 61.916 kg (136 lb 8 oz) 62.5 kg (137 lb 12.6 oz)   Body mass index is 24.78 kg/(m^2).  Exam:   General:  Thin adult female, No acute distress  HEENT:  NCAT, MMM  Cardiovascular:  RRR, nl S1, S2 no mrg, 2+  pulses, warm extremities  Respiratory:  CTAB, no increased WOB  Abdomen:   NABS, soft, NT/ND  MSK:   Normal tone and bulk, no LEE  Neuro:  Cranial nerves II through XII grossly intact, strength 5 out of 5 throughout, sensation to light touch intact. Persistent dysmetria with right upper and lower extremities.  Data Reviewed: Basic Metabolic Panel:  Recent Labs Lab 06/19/15 1308 06/19/15 1323  NA 144 144  K 3.1* 3.1*  CL 103 101  CO2 30  --   GLUCOSE 113* 111*  BUN 8 10  CREATININE 0.63 0.70   CALCIUM 9.6  --    Liver Function Tests:  Recent Labs Lab 06/19/15 1308  AST 20  ALT 12*  ALKPHOS 82  BILITOT 0.3  PROT 6.9  ALBUMIN 3.8   No results for input(s): LIPASE, AMYLASE in the last 168 hours. No results for input(s): AMMONIA in the last 168 hours. CBC:  Recent Labs Lab 06/19/15 1308 06/19/15 1323  WBC 8.1  --   NEUTROABS 4.5  --   HGB 13.5 15.6*  HCT 40.7 46.0  MCV 92.3  --   PLT 343  --     Recent Results (from the past 240 hour(s))  MRSA PCR Screening     Status: None   Collection Time: 06/19/15  6:58 PM  Result Value Ref Range Status   MRSA by PCR NEGATIVE NEGATIVE Final    Comment:        The GeneXpert MRSA Assay (FDA approved for NASAL specimens only), is one component of a comprehensive MRSA colonization surveillance program. It is not intended to diagnose MRSA infection nor to guide or monitor treatment for MRSA infections.      Studies: Dg Chest 2 View  06/19/2015   CLINICAL DATA:  Hypertension  EXAM: CHEST  2 VIEW  COMPARISON:  None.  FINDINGS: Lungs are clear.  No pleural effusion or pneumothorax.  The heart is normal in size.  Visualized osseous structures are within normal limits.  IMPRESSION: No evidence of acute cardiopulmonary disease.   Electronically Signed   By: Charline Bills M.D.   On: 06/19/2015 21:44   Ct Head Wo Contrast  06/19/2015   CLINICAL DATA:  One day history of right-sided weakness and gait disturbance  EXAM: CT HEAD WITHOUT CONTRAST  TECHNIQUE: Contiguous axial images were obtained from the base of the skull through the vertex without intravenous contrast.  COMPARISON:  None.  FINDINGS: The ventricles are normal in size and configuration. There is no intracranial mass, hemorrhage, extra-axial fluid collection, or midline shift. Gray-white compartments appear normal. No acute infarct evident. The bony calvarium appears intact. The mastoid air cells are clear. There is mild mucosal thickening in multiple ethmoid air  cells bilaterally.  IMPRESSION: Mild ethmoid sinus disease. No intracranial mass, hemorrhage, or extra-axial fluid collection. No focal gray - white compartment lesions/acute appearing infarct.   Electronically Signed   By: Bretta Bang III M.D.   On: 06/19/2015 15:20   Mr Brain Wo Contrast  06/19/2015   CLINICAL DATA:  Awoke at 9 a.m. this morning with RIGHT-sided weakness and difficulty walking. History of hypertension.  EXAM: MRI HEAD WITHOUT CONTRAST  MRA HEAD WITHOUT CONTRAST  TECHNIQUE: Multiplanar, multiecho pulse sequences of the brain and surrounding structures were obtained without intravenous contrast. Angiographic images of the head were obtained using MRA technique without contrast.  COMPARISON:  CT head June 19, 2015 at new 1521 hours  FINDINGS: MRI HEAD FINDINGS  The ventricles  and sulci are normal for patient's age. No suspicious parenchymal signal, mass lesions, mass effect. Patchy LEFT greater than RIGHT periventricular RIGHT FLAIR T2 hyperintensities. Multiple tiny T2 hyperintensities in the pons. 6 mm focus of acute ischemia posterior limb of the internal capsule, with faint T2 bright signal. No susceptibility artifact to suggest hemorrhage.  No abnormal extra-axial fluid collections. No extra-axial masses though, contrast enhanced sequences would be more sensitive. Normal major intracranial vascular flow voids seen at the skull base.  Ocular globes and orbital contents are unremarkable though not tailored for evaluation. No abnormal sellar expansion. Visualized paranasal sinuses and mastoid air cells are well-aerated. No suspicious calvarial bone marrow signal. No abnormal sellar expansion. Craniocervical junction maintained.  MRA HEAD FINDINGS  Anterior circulation: Normal flow related enhancement of the included cervical, petrous, cavernous and supra clinoid internal carotid arteries. Patent anterior communicating artery. Accessory LEFT MCA arising proximal to the carotid terminus.  Focal high-grade stenosis of the distal LEFT middle cerebral artery, proximal M2 segment. Focal high-grade stenosis LEFT M 3 segment.  No large vessel occlusion, abnormal luminal irregularity, aneurysm.  Posterior circulation: RIGHT vertebral artery is dominant. Basilar artery is patent, with normal flow related enhancement of the main branch vessels. Flow related enhancement of the posterior cerebral arteries. Moderate stenosis proximal RIGHT P2 segment. Focal moderate stenosis LEFT P3 segment, high-grade stenosis distal LEFT P3 segment.  No large vessel occlusion, abnormal luminal irregularity, aneurysm.  IMPRESSION: MRI HEAD: Acute subcentimeter infarct posterior limb of the LEFT internal capsule.  Mild to moderate white matter changes compatible chronic small vessel ischemic disease.  MRA HEAD: Accessory LEFT MCA (for purposes of this report described as proximal and distal), with focal high-grade stenosis of distal LEFT MCA M2 segment. Focal high-grade stenosis LEFT M3 segment.  Multiple stenosis of the posterior circulation including focal high-grade stenosis LEFT P3 segment.  No acute large vessel occlusion.   Electronically Signed   By: Awilda Metro M.D.   On: 06/19/2015 22:46   Mr Maxine Glenn Head/brain Wo Cm  06/19/2015   CLINICAL DATA:  Awoke at 9 a.m. this morning with RIGHT-sided weakness and difficulty walking. History of hypertension.  EXAM: MRI HEAD WITHOUT CONTRAST  MRA HEAD WITHOUT CONTRAST  TECHNIQUE: Multiplanar, multiecho pulse sequences of the brain and surrounding structures were obtained without intravenous contrast. Angiographic images of the head were obtained using MRA technique without contrast.  COMPARISON:  CT head June 19, 2015 at new 1521 hours  FINDINGS: MRI HEAD FINDINGS  The ventricles and sulci are normal for patient's age. No suspicious parenchymal signal, mass lesions, mass effect. Patchy LEFT greater than RIGHT periventricular RIGHT FLAIR T2 hyperintensities. Multiple tiny T2  hyperintensities in the pons. 6 mm focus of acute ischemia posterior limb of the internal capsule, with faint T2 bright signal. No susceptibility artifact to suggest hemorrhage.  No abnormal extra-axial fluid collections. No extra-axial masses though, contrast enhanced sequences would be more sensitive. Normal major intracranial vascular flow voids seen at the skull base.  Ocular globes and orbital contents are unremarkable though not tailored for evaluation. No abnormal sellar expansion. Visualized paranasal sinuses and mastoid air cells are well-aerated. No suspicious calvarial bone marrow signal. No abnormal sellar expansion. Craniocervical junction maintained.  MRA HEAD FINDINGS  Anterior circulation: Normal flow related enhancement of the included cervical, petrous, cavernous and supra clinoid internal carotid arteries. Patent anterior communicating artery. Accessory LEFT MCA arising proximal to the carotid terminus. Focal high-grade stenosis of the distal LEFT middle cerebral artery, proximal M2  segment. Focal high-grade stenosis LEFT M 3 segment.  No large vessel occlusion, abnormal luminal irregularity, aneurysm.  Posterior circulation: RIGHT vertebral artery is dominant. Basilar artery is patent, with normal flow related enhancement of the main branch vessels. Flow related enhancement of the posterior cerebral arteries. Moderate stenosis proximal RIGHT P2 segment. Focal moderate stenosis LEFT P3 segment, high-grade stenosis distal LEFT P3 segment.  No large vessel occlusion, abnormal luminal irregularity, aneurysm.  IMPRESSION: MRI HEAD: Acute subcentimeter infarct posterior limb of the LEFT internal capsule.  Mild to moderate white matter changes compatible chronic small vessel ischemic disease.  MRA HEAD: Accessory LEFT MCA (for purposes of this report described as proximal and distal), with focal high-grade stenosis of distal LEFT MCA M2 segment. Focal high-grade stenosis LEFT M3 segment.  Multiple  stenosis of the posterior circulation including focal high-grade stenosis LEFT P3 segment.  No acute large vessel occlusion.   Electronically Signed   By: Awilda Metro M.D.   On: 06/19/2015 22:46    Scheduled Meds: .  stroke: mapping our early stages of recovery book   Does not apply Once  . aspirin  300 mg Rectal Daily   Or  . aspirin  325 mg Oral Daily  . atorvastatin  80 mg Oral q1800  . enoxaparin (LOVENOX) injection  30 mg Subcutaneous Q24H  . nicotine  21 mg Transdermal Daily   Continuous Infusions:   Principal Problem:   Stroke Active Problems:   Essential hypertension   Hypokalemia   Tobacco abuse    Time spent: 30 min    Hollynn Garno  [facility] Pager [phone]. If 7PM-7AM, please contact night-coverage at [email or web address], password [facility] 06/20/2015, 1:18 PM  LOS: 1 day

## 2015-06-20 NOTE — Progress Notes (Signed)
  Echocardiogram 2D Echocardiogram has been performed.  Leta Jungling M 06/20/2015, 9:02 AM

## 2015-06-20 NOTE — Research (Signed)
Spoke with pt about the STROKE AF trial. Pt will review consent with family and let us know her decision tomorrow or Thursday.  

## 2015-06-20 NOTE — Progress Notes (Signed)
STROKE TEAM PROGRESS NOTE   SUBJECTIVE (INTERVAL HISTORY) No family is at the bedside.  Overall she feels her condition is gradually improving. She still has mild RUE weakness.    OBJECTIVE Temp:  [97.6 F (36.4 C)-98.7 F (37.1 C)] 98.2 F (36.8 C) (08/16 1111) Pulse Rate:  [49-67] 54 (08/16 1111) Cardiac Rhythm:  [-]  Resp:  [12-19] 14 (08/16 1111) BP: (135-215)/(68-98) 135/91 mmHg (08/16 1111) SpO2:  [94 %-100 %] 100 % (08/16 1111) Weight:  [137 lb 12.6 oz (62.5 kg)] 137 lb 12.6 oz (62.5 kg) (08/15 1900)  No results for input(s): GLUCAP in the last 168 hours.  Recent Labs Lab 06/19/15 1308 06/19/15 1323  NA 144 144  K 3.1* 3.1*  CL 103 101  CO2 30  --   GLUCOSE 113* 111*  BUN 8 10  CREATININE 0.63 0.70  CALCIUM 9.6  --     Recent Labs Lab 06/19/15 1308  AST 20  ALT 12*  ALKPHOS 82  BILITOT 0.3  PROT 6.9  ALBUMIN 3.8    Recent Labs Lab 06/19/15 1308 06/19/15 1323  WBC 8.1  --   NEUTROABS 4.5  --   HGB 13.5 15.6*  HCT 40.7 46.0  MCV 92.3  --   PLT 343  --    No results for input(s): CKTOTAL, CKMB, CKMBINDEX, TROPONINI in the last 168 hours.  Recent Labs  06/19/15 1308  LABPROT 14.1  INR 1.07    Recent Labs  06/19/15 1615  COLORURINE YELLOW  LABSPEC 1.011  PHURINE 6.5  GLUCOSEU NEGATIVE  HGBUR MODERATE*  BILIRUBINUR NEGATIVE  KETONESUR NEGATIVE  PROTEINUR NEGATIVE  UROBILINOGEN 1.0  NITRITE NEGATIVE  LEUKOCYTESUR NEGATIVE       Component Value Date/Time   CHOL 112 06/20/2015 0334   TRIG 90 06/20/2015 0334   HDL 31* 06/20/2015 0334   CHOLHDL 3.6 06/20/2015 0334   VLDL 18 06/20/2015 0334   LDLCALC 63 06/20/2015 0334   No results found for: HGBA1C    Component Value Date/Time   LABOPIA NONE DETECTED 06/19/2015 1615   COCAINSCRNUR NONE DETECTED 06/19/2015 1615   LABBENZ NONE DETECTED 06/19/2015 1615   AMPHETMU NONE DETECTED 06/19/2015 1615   THCU NONE DETECTED 06/19/2015 1615   LABBARB NONE DETECTED 06/19/2015 1615    No  results for input(s): ETH in the last 168 hours.  I have personally reviewed the radiological images below and agree with the radiology interpretations.  Dg Chest 2 View  06/19/2015    IMPRESSION: No evidence of acute cardiopulmonary disease.    Ct Head Wo Contrast  06/19/2015    IMPRESSION: Mild ethmoid sinus disease. No intracranial mass, hemorrhage, or extra-axial fluid collection. No focal gray - white compartment lesions/acute appearing infarct.     Mri and Mra Brain Wo Contrast  06/19/2015    IMPRESSION: MRI HEAD: Acute subcentimeter infarct posterior limb of the LEFT internal capsule.  Mild to moderate white matter changes compatible chronic small vessel ischemic disease.  MRA HEAD: Accessory LEFT MCA (for purposes of this report described as proximal and distal), with focal high-grade stenosis of distal LEFT MCA M2 segment. Focal high-grade stenosis LEFT M3 segment.  Multiple stenosis of the posterior circulation including focal high-grade stenosis LEFT P3 segment.  No acute large vessel occlusion.  Carotid Doppler  pending  2D Echocardiogram  - Left ventricle: The cavity size was normal. Wall thickness was normal. Systolic function was normal. The estimated ejection fraction was in the range of 55% to 60%. Wall  motion was normal; there were no regional wall motion abnormalities. Doppler parameters are consistent with abnormal left ventricular relaxation (grade 1 diastolic dysfunction). - Aortic valve: There was no stenosis. - Mitral valve: There was trivial regurgitation. - Right ventricle: The cavity size was normal. Systolic function was normal. - Tricuspid valve: Peak RV-RA gradient (S): 19 mm Hg. - Pulmonary arteries: PA peak pressure: 22 mm Hg (S). - Inferior vena cava: The vessel was normal in size. The respirophasic diameter changes were in the normal range (>= 50%), consistent with normal central venous pressure. Impressions: - Normal LV size and  systolic function, EF 55-60%. Normal RV size and systolic function. No significant valvular abnormalities.  PHYSICAL EXAM  Temp:  [97.6 F (36.4 C)-98.7 F (37.1 C)] 98.2 F (36.8 C) (08/16 1111) Pulse Rate:  [49-67] 54 (08/16 1111) Resp:  [12-19] 14 (08/16 1111) BP: (135-215)/(68-98) 135/91 mmHg (08/16 1111) SpO2:  [94 %-100 %] 100 % (08/16 1111) Weight:  [137 lb 12.6 oz (62.5 kg)] 137 lb 12.6 oz (62.5 kg) (08/15 1900)  General - Well nourished, well developed, in no apparent distress.  Ophthalmologic - Sharp disc margins OU.  Cardiovascular - Regular rate and rhythm with no murmur.  Neck - supple, no carotid bruits  Mental Status -  Level of arousal and orientation to time, place, and person were intact. Language including expression, naming, repetition, comprehension was assessed and found intact. Fund of Knowledge was assessed and was intact.  Cranial Nerves II - XII - II - Visual field intact OU. III, IV, VI - Extraocular movements intact. V - Facial sensation intact bilaterally. VII - right nasolabial fold flattening. VIII - Hearing & vestibular intact bilaterally. X - Palate elevates symmetrically. XI - Chin turning & shoulder shrug intact bilaterally. XII - Tongue protrusion intact.  Motor Strength - The patient's strength was normal in all extremities except RUE 4/5 with right hand dexterity difficulty and pronator drift was present on the right.  Bulk was normal and fasciculations were absent.   Motor Tone - Muscle tone was assessed at the neck and appendages and was normal.  Reflexes - The patient's reflexes were symmetrical in all extremities and she had no pathological reflexes.  Sensory - Light touch, temperature/pinprick were assessed and were symmetrical.    Coordination - The patient had normal movements in the hands and feet with no ataxia or dysmetria.  Tremor was absent.  Gait and Station - not tested due to safety  concerns   ASSESSMENT/PLAN Ms. Catherine Floyd is a 64 y.o. female with history of HTN, and current smoker admitted for right side weakness. MRI showed left IC stroke. Symptoms gradually improving.    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  MRI  Left IC infarct  MRA  Intracranial atherosclerosis  Carotid Doppler  pending  2D Echo  unremarkable  LDL 63  HgbA1c pending  lovenox for VTE prophylaxis  no antithrombotic prior to admission, now on aspirin 325 mg orally every day  Patient counseled to be compliant with her antithrombotic medications  Ongoing aggressive stroke risk factor management  Therapy recommendations:  pending  Disposition:  pending  Hypertension  Home meds:   Metoprolol, and doxazosin and nisoldipine Permissive hypertension (OK if <220/120) for 24-48 hours post stroke and then gradually normalized within 5-7 days. Currently on hydralazine PRN  Unstable but under the target  Patient counseled to be compliant with her blood pressure medications  Hyperlipidemia  Home  meds:  none   Currently on none  LDL 63, goal < 70  On lipitor 80mg  now  Continue statin at discharge  Tobacco abuse  Current smoker  Smoking cessation counseling provided  Nicotine patch provided  Pt is willing to quit  Other Stroke Risk Factors  Advanced age  Other Active Problems  hypokalemia  Other Pertinent History    Hospital day # 1  Marvel Plan, MD PhD Stroke Neurology 06/20/2015 3:42 PM    To contact Stroke Continuity provider, please refer to [email or web address]. After hours, contact General Neurology

## 2015-06-20 NOTE — Progress Notes (Signed)
Pt arrived to unit alert and oriented. Oriented to unit. Bed alarm activated. Call bell at side. Will continue to monitor.Gara Kroner, RN

## 2015-06-21 ENCOUNTER — Inpatient Hospital Stay (HOSPITAL_COMMUNITY): Payer: BLUE CROSS/BLUE SHIELD

## 2015-06-21 DIAGNOSIS — I639 Cerebral infarction, unspecified: Secondary | ICD-10-CM

## 2015-06-21 LAB — CBC
HCT: 38.8 % (ref 36.0–46.0)
Hemoglobin: 12.5 g/dL (ref 12.0–15.0)
MCH: 29.8 pg (ref 26.0–34.0)
MCHC: 32.2 g/dL (ref 30.0–36.0)
MCV: 92.6 fL (ref 78.0–100.0)
Platelets: 310 10*3/uL (ref 150–400)
RBC: 4.19 MIL/uL (ref 3.87–5.11)
RDW: 13.1 % (ref 11.5–15.5)
WBC: 6.3 10*3/uL (ref 4.0–10.5)

## 2015-06-21 LAB — HEMOGLOBIN A1C
Hgb A1c MFr Bld: 5.5 % (ref 4.8–5.6)
Mean Plasma Glucose: 111 mg/dL

## 2015-06-21 LAB — BASIC METABOLIC PANEL
Anion gap: 9 (ref 5–15)
Anion gap: 9 (ref 5–15)
BUN: 10 mg/dL (ref 6–20)
BUN: 10 mg/dL (ref 6–20)
CO2: 32 mmol/L (ref 22–32)
CO2: 29 mmol/L (ref 22–32)
Calcium: 9.1 mg/dL (ref 8.9–10.3)
Calcium: 9.7 mg/dL (ref 8.9–10.3)
Chloride: 95 mmol/L — ABNORMAL LOW (ref 101–111)
Chloride: 100 mmol/L — ABNORMAL LOW (ref 101–111)
Creatinine, Ser: 0.64 mg/dL (ref 0.44–1.00)
Creatinine, Ser: 0.59 mg/dL (ref 0.44–1.00)
GFR calc Af Amer: >60 mL/min (ref >60)
GFR calc Af Amer: >60 mL/min (ref >60)
GFR calc non Af Amer: >60 mL/min (ref >60)
GFR calc non Af Amer: >60 mL/min (ref >60)
Glucose, Bld: 89 mg/dL (ref 65–99)
Glucose, Bld: 109 mg/dL — ABNORMAL HIGH (ref 65–99)
Potassium: 2.9 mmol/L — ABNORMAL LOW (ref 3.5–5.1)
Potassium: 3.6 mmol/L (ref 3.5–5.1)
Sodium: 136 mmol/L (ref 135–145)
Sodium: 138 mmol/L (ref 135–145)

## 2015-06-21 LAB — MAGNESIUM: Magnesium: 2 mg/dL (ref 1.7–2.4)

## 2015-06-21 MED ORDER — POTASSIUM CHLORIDE CRYS ER 20 MEQ PO TBCR
40.0000 meq | EXTENDED_RELEASE_TABLET | ORAL | Status: AC
  Administered 2015-06-21 (×2): 40 meq via ORAL
  Filled 2015-06-21: qty 2

## 2015-06-21 MED ORDER — POTASSIUM CHLORIDE CRYS ER 10 MEQ PO TBCR
EXTENDED_RELEASE_TABLET | ORAL | Status: AC
  Administered 2015-06-21: 40 meq via ORAL
  Filled 2015-06-21: qty 4

## 2015-06-21 MED ORDER — POTASSIUM CHLORIDE CRYS ER 20 MEQ PO TBCR
40.0000 meq | EXTENDED_RELEASE_TABLET | Freq: Once | ORAL | Status: DC
  Administered 2015-06-21: 40 meq via ORAL

## 2015-06-21 NOTE — Progress Notes (Signed)
Physical Therapy Treatment Patient Details Name: Catherine Floyd MRN: [id number] DOB: January 12, 1951 Today's Date: 06/21/2015    History of Present Illness 64 y.o. female presenting with right dysmetria and mild right sided weakness. Patient hypertensive. Not compliant with antihypertensive medications. Posterior circulation infarct suspected. MRI shows Acute subcentimeter infarct posterior limb of the LEFT     PT Comments    Session limited by arrival of lunch. PT to return after lunch. Pt with improved ambulation with RW compared to no AD. PT to further assess patient after lunch.   Follow Up Recommendations  Outpatient PT;Supervision for mobility/OOB     Equipment Recommendations  None recommended by PT    Recommendations for Other Services       Precautions / Restrictions Precautions Precautions: Fall Restrictions Weight Bearing Restrictions: No    Mobility  Bed Mobility Overal bed mobility: Needs Assistance Bed Mobility: Supine to Sit;Sit to Supine     Supine to sit: Supervision Sit to supine: Supervision   General bed mobility comments: supervision for stability upon standing  Transfers Overall transfer level: Needs assistance   Transfers: Sit to/from Stand Sit to Stand: Min guard         General transfer comment: v/c's for safe hand placement  Ambulation/Gait Ambulation/Gait assistance: Min assist Ambulation Distance (Feet): 80 Feet Assistive device: Rolling walker (2 wheeled) Gait Pattern/deviations: Step-through pattern     General Gait Details: pt with ataxic gait pattern, cross over gait pattern with turning, minA to regain balance   Stairs            Wheelchair Mobility    Modified Rankin (Stroke Patients Only) Modified Rankin (Stroke Patients Only) Pre-Morbid Rankin Score: No symptoms Modified Rankin: Moderately severe disability     Balance Overall balance assessment: Needs assistance Sitting-balance support: No upper  extremity supported Sitting balance-Leahy Scale: Good     Standing balance support: Single extremity supported Standing balance-Leahy Scale: Poor                      Cognition Arousal/Alertness: Awake/alert Behavior During Therapy: WFL for tasks assessed/performed Overall Cognitive Status: Within Functional Limits for tasks assessed                      Exercises      General Comments        Pertinent Vitals/Pain Pain Assessment: No/denies pain    Home Living Family/patient expects to be discharged to:: Private residence Living Arrangements: Spouse/significant other Available Help at Discharge: Friend(s);Family;Other (Comment) Type of Home: House Home Access: Stairs to enter Entrance Stairs-Rails: Right;Left Home Layout: One level Home Equipment: None      Prior Function Level of Independence: Independent          PT Goals (current goals can now be found in the care plan section) Progress towards PT goals: Progressing toward goals    Frequency  Min 3X/week    PT Plan Current plan remains appropriate    Co-evaluation             End of Session Equipment Utilized During Treatment: Gait belt Activity Tolerance: Patient tolerated treatment well;Patient limited by fatigue Patient left: in chair;with call bell/phone within reach;with family/visitor present     Time: 1214-1230 PT Time Calculation (min) (ACUTE ONLY): 16 min  Charges:  $Gait Training: 8-22 mins                    G Codes:  Marcene Brawn 06/21/2015, 2:52 PM  Lewis Shock, PT, DPT Pager #: [phone] Office #: [phone]

## 2015-06-21 NOTE — Progress Notes (Signed)
STROKE TEAM PROGRESS NOTE   SUBJECTIVE (INTERVAL HISTORY) No family at bedside. No new complaints. Ready to sign the STROKE AF consent form.    OBJECTIVE Temp:  [97.3 F (36.3 C)-98.7 F (37.1 C)] 98.7 F (37.1 C) (08/17 1325) Pulse Rate:  [55-64] 64 (08/17 1325) Cardiac Rhythm:  [-] Heart block (08/17 0802) Resp:  [17-19] 18 (08/17 1325) BP: (123-158)/(61-82) 127/67 mmHg (08/17 1026) SpO2:  [97 %-100 %] 100 % (08/17 1325) Weight:  [61.9 kg (136 lb 7.4 oz)] 61.9 kg (136 lb 7.4 oz) (08/16 2114)  No results for input(s): GLUCAP in the last 168 hours.  Recent Labs Lab 06/19/15 1308 06/19/15 1323 06/21/15 0652  NA 144 144 136  K 3.1* 3.1* 2.9*  CL 103 101 95*  CO2 30  --  32  GLUCOSE 113* 111* 89  BUN CREATININE 0.63 0.70 0.64  CALCIUM 9.6  --  9.1    Recent Labs Lab 06/19/15 1308  AST 20  ALT 12*  ALKPHOS 82  BILITOT 0.3  PROT 6.9  ALBUMIN 3.8    Recent Labs Lab 06/19/15 1308 06/19/15 1323 06/21/15 0652  WBC 8.1  --  6.3  NEUTROABS 4.5  --   --   HGB 13.5 15.6* 12.5  HCT 40.7 46.0 38.8  MCV 92.3  --  92.6  PLT 343  --  310   No results for input(s): CKTOTAL, CKMB, CKMBINDEX, TROPONINI in the last 168 hours.  Recent Labs  06/19/15 1308  LABPROT 14.1  INR 1.07    Recent Labs  06/19/15 1615  COLORURINE YELLOW  LABSPEC 1.011  PHURINE 6.5  GLUCOSEU NEGATIVE  HGBUR MODERATE*  BILIRUBINUR NEGATIVE  KETONESUR NEGATIVE  PROTEINUR NEGATIVE  UROBILINOGEN 1.0  NITRITE NEGATIVE  LEUKOCYTESUR NEGATIVE       Component Value Date/Time   CHOL 112 06/20/2015 0334   TRIG 90 06/20/2015 0334   HDL 31* 06/20/2015 0334   CHOLHDL 3.6 06/20/2015 0334   VLDL 18 06/20/2015 0334   LDLCALC 63 06/20/2015 0334   Lab Results  Component Value Date   HGBA1C 5.5 06/20/2015      Component Value Date/Time   LABOPIA NONE DETECTED 06/19/2015 1615   COCAINSCRNUR NONE DETECTED 06/19/2015 1615   LABBENZ NONE DETECTED 06/19/2015 1615   AMPHETMU NONE  DETECTED 06/19/2015 1615   THCU NONE DETECTED 06/19/2015 1615   LABBARB NONE DETECTED 06/19/2015 1615    No results for input(s): ETH in the last 168 hours.  I have personally reviewed the radiological images below and agree with the radiology interpretations.  Dg Chest 2 View 06/19/2015    IMPRESSION: No evidence of acute cardiopulmonary disease.    Ct Head Wo Contrast 06/19/2015    IMPRESSION: Mild ethmoid sinus disease. No intracranial mass, hemorrhage, or extra-axial fluid collection. No focal gray - white compartment lesions/acute appearing infarct.     Mri and Mra Brain Wo Contrast 06/19/2015    IMPRESSION: MRI HEAD: Acute subcentimeter infarct posterior limb of the LEFT internal capsule.  Mild to moderate white matter changes compatible chronic small vessel ischemic disease.  MRA HEAD: Accessory LEFT MCA (for purposes of this report described as proximal and distal), with focal high-grade stenosis of distal LEFT MCA M2 segment. Focal high-grade stenosis LEFT M3 segment.  Multiple stenosis of the posterior circulation including focal high-grade stenosis LEFT P3 segment.  No acute large vessel occlusion.  CUS -  There is 1-39% bilateral ICA stenosis. Vertebral artery flow is antegrade.  2D Echocardiogram  - Left ventricle: The cavity size was normal. Wall thickness wasnormal. Systolic function was normal. The estimated ejectionfraction was in the range of 55% to 60%. Wall motion was normal;there were no regional wall motion abnormalities. Dopplerparameters are consistent with abnormal left ventricularrelaxation (grade 1 diastolic dysfunction). - Aortic valve: There was no stenosis. - Mitral valve: There was trivial regurgitation. - Right ventricle: The cavity size was normal. Systolic functionwas normal. - Tricuspid valve: Peak RV-RA gradient (S): 19 mm Hg. - Pulmonary arteries: PA peak pressure: 22 mm Hg (S). - Inferior vena cava: The vessel was normal in size. Therespirophasic  diameter changes were in the normal range (>= 50%),consistent with normal central venous pressure. Impressions: Normal LV size and systolic function, EF 55-60%. Normal RV size and systolic function. No significant valvular abnormalities.   PHYSICAL EXAM  Temp:  [97.3 F (36.3 C)-98.7 F (37.1 C)] 98.7 F (37.1 C) (08/17 1325) Pulse Rate:  [55-64] 64 (08/17 1325) Resp:  [17-19] 18 (08/17 1325) BP: (123-158)/(61-82) 127/67 mmHg (08/17 1026) SpO2:  [97 %-100 %] 100 % (08/17 1325) Weight:  [61.9 kg (136 lb 7.4 oz)] 61.9 kg (136 lb 7.4 oz) (08/16 2114)  General - Well nourished, well developed, in no apparent distress.  Ophthalmologic - Sharp disc margins OU.  Cardiovascular - Regular rate and rhythm with no murmur.  Neck - supple, no carotid bruits  Mental Status -  Level of arousal and orientation to time, place, and person were intact. Language including expression, naming, repetition, comprehension was assessed and found intact. Fund of Knowledge was assessed and was intact.  Cranial Nerves II - XII - II - Visual field intact OU. III, IV, VI - Extraocular movements intact. V - Facial sensation intact bilaterally. VII - mild right nasolabial fold flattening. VIII - Hearing & vestibular intact bilaterally. X - Palate elevates symmetrically. XI - Chin turning & shoulder shrug intact bilaterally. XII - Tongue protrusion intact.  Motor Strength - The patient's strength was normal in all extremities except RUE 4+/5 with mild right hand dexterity difficulty and no pronator drift .  Bulk was normal and fasciculations were absent.   Motor Tone - Muscle tone was assessed at the neck and appendages and was normal.  Reflexes - The patient's reflexes were symmetrical in all extremities and she had no pathological reflexes.  Sensory - Light touch, temperature/pinprick were assessed and were symmetrical.    Coordination - The patient had normal movements in the hands and feet with no  ataxia or dysmetria.  Tremor was absent.  Gait and Station - not tested due to safety concerns   ASSESSMENT/PLAN Catherine Floyd is a 64 y.o. female with history of HTN, and current smoker admitted for right side weakness. MRI showed left IC stroke. Symptoms gradually improving.    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  MRI  Left IC infarct  MRA  Intracranial atherosclerosis  Carotid Doppler  No significant stenosis   2D Echo  unremarkable  LDL 63  HgbA1c 5.5, at goal  lovenox for VTE prophylaxis  no antithrombotic prior to admission, now on aspirin 325 mg orally every day  Patient counseled to be compliant with her antithrombotic medications  Ongoing aggressive stroke risk factor management  Therapy recommendations:  OP PT and OT  Disposition:  Home with OP therapies  Hypertension  Home meds:   Metoprolol, and doxazosin and nisoldipine Permissive hypertension (OK if <220/120) for 24-48 hours  post stroke and then gradually normalized within 5-7 days. Currently on hydralazine PRN  BP improved today, last 127/67  Patient counseled to be compliant with her blood pressure medications  Tobacco abuse  Current smoker  Smoking cessation counseling provided  Nicotine patch provided  Pt is willing to quit  Other Stroke Risk Factors  Advanced age  Other Active Problems  hypokalemia  Hospital day # 2  Neurology will sign off. Please call with questions. Pt will follow up with Dr. [name] at [facility] in about 2 months. Thanks for the consult.  Marvel Plan, MD PhD Stroke Neurology 06/21/2015 5:58 PM   To contact Stroke Continuity provider, please refer to [email or web address]. After hours, contact General Neurology

## 2015-06-21 NOTE — Progress Notes (Signed)
VASCULAR LAB PRELIMINARY  PRELIMINARY  PRELIMINARY  PRELIMINARY  Carotid duplex  completed.    Preliminary report:  Bilateral:  1-39% ICA stenosis.  Vertebral artery flow is antegrade.   Thereasa Parkin, RVT 06/21/2015, 2:58 PM

## 2015-06-21 NOTE — Progress Notes (Signed)
CM met with patient to discuss outpatient therapy per the request of attending MD.  Patient is agreeable to outpatient therapy and has chosen [facility].  CM awaiting OT evaluation to determine necessary therapies.  Written information was provided and entered into the AVS.

## 2015-06-21 NOTE — Progress Notes (Signed)
Physical Therapy Treatment Patient Details Name: Catherine Floyd MRN: [id number] DOB: 1951/05/02 Today's Date: 06/21/2015    History of Present Illness 64 y.o. female presenting with right dysmetria and mild right sided weakness. Patient hypertensive. Not compliant with antihypertensive medications. Posterior circulation infarct suspected. MRI shows Acute subcentimeter infarct posterior limb of the LEFT     PT Comments    PT returned to complete further assessment and balance training to decrease falls risk. Pt con't to have LOB when amb without AD at this time, con't to recommend RW for safe ambulation and supervision for OOB mobility. Pt con't to demo ataxia and bilat LE weakness R worse than L. Acute PT to con't to follow to progress independence.   Follow Up Recommendations  Supervision for mobility/OOB;Home health PT outpatient PT preferably if she can find a ride     Equipment Recommendations  None recommended by PT    Recommendations for Other Services       Precautions / Restrictions Precautions Precautions: Fall Restrictions Weight Bearing Restrictions: No    Mobility  Bed Mobility Overal bed mobility: Modified Independent Bed Mobility: Supine to Sit;Sit to Supine     Supine to sit: Modified independent (Device/Increase time) Sit to supine: Modified independent (Device/Increase time)   General bed mobility comments: pt with safe techniqu  Transfers Overall transfer level: Needs assistance Equipment used: None Transfers: Sit to/from Stand Sit to Stand: Min guard         General transfer comment: v/c's for safe hand placement  Ambulation/Gait Ambulation/Gait assistance: Min assist Ambulation Distance (Feet): 200 Feet (x2) Assistive device: Rolling walker (2 wheeled) Gait Pattern/deviations: Step-through pattern;Ataxic;Scissoring;Staggering left;Staggering right;Narrow base of support Gait velocity: slow   General Gait Details: pt with  impaired motor planning/sequencing requiring constant minA to prevent falling when ambulationing without AD. pt with increased stabiliy with RW, patient also agrees. Pt required v/c's to pick up R foot to clear swing phase   Stairs            Wheelchair Mobility    Modified Rankin (Stroke Patients Only) Modified Rankin (Stroke Patients Only) Pre-Morbid Rankin Score: No symptoms Modified Rankin: Moderately severe disability     Balance Overall balance assessment: Needs assistance Sitting-balance support: No upper extremity supported Sitting balance-Leahy Scale: Good     Standing balance support: Single extremity supported Standing balance-Leahy Scale: Poor               High level balance activites: Side stepping;Backward walking;Direction changes;Turns;Sudden stops;Head turns;Braiding High Level Balance Comments: when asked to "walk as fast a she felt she could", pt with significant LOB and staggering requiring modA to maintain upright posture. Pt unable to tandem walk without modA to maintain balance. Pt required minA for wakling backwards and side stepping. working on Tax inspector to clear feet however pt required freq rest breaks and min/modA to maintain balance    Cognition Arousal/Alertness: Awake/alert Behavior During Therapy: WFL for tasks assessed/performed Overall Cognitive Status: Within Functional Limits for tasks assessed                      Exercises      General Comments General comments (skin integrity, edema, etc.): pt required modA with with all high level balance activities due ot LOB      Pertinent Vitals/Pain Pain Assessment: No/denies pain    Home Living Family/patient expects to be discharged to:: Private residence Living Arrangements: Spouse/significant other Available Help at Discharge: Friend(s);Family;Other (Comment) Type of  Home: House Home Access: Stairs to enter Entrance Stairs-Rails: Right;Left Home Layout: One  level Home Equipment: None      Prior Function Level of Independence: Independent          PT Goals (current goals can now be found in the care plan section) Acute Rehab PT Goals Patient Stated Goal: home Progress towards PT goals: Progressing toward goals    Frequency  Min 3X/week    PT Plan Current plan remains appropriate    Co-evaluation             End of Session Equipment Utilized During Treatment: Gait belt Activity Tolerance: Patient tolerated treatment well Patient left: in chair;with call bell/phone within reach;with family/visitor present     Time: 1610-9604 PT Time Calculation (min) (ACUTE ONLY): 23 min  Charges:  $Gait Training: 8-22 mins $Neuromuscular Re-education: 8-22 mins                    G Codes:      Marcene Brawn 06/21/2015, 3:12 PM   Lewis Shock, PT, DPT Pager #: [phone] Office #: [phone]

## 2015-06-21 NOTE — Evaluation (Signed)
Occupational Therapy Evaluation Patient Details Name: Catherine Floyd MRN: [id number] DOB: 01-Jan-1951 Today's Date: 06/21/2015    History of Present Illness 64 y.o. female presenting with right dysmetria and mild right sided weakness. Patient hypertensive. Not compliant with antihypertensive medications. Posterior circulation infarct suspected. MRI shows Acute subcentimeter infarct posterior limb of the LEFT    Clinical Impression   PT admitted with L MCA M2 infarct. Pt currently with functional limitiations due to the deficits listed below (see OT problem list). PTA independent with all adls from home. Pt will benefit from skilled OT to increase their independence and safety with adls and balance to allow discharge outpatient. Pt demonstrates fall risk due to scissoring gait and LOB to the R.     Follow Up Recommendations  Outpatient OT    Equipment Recommendations  None recommended by OT    Recommendations for Other Services       Precautions / Restrictions Precautions Precautions: Fall Restrictions Weight Bearing Restrictions: No      Mobility Bed Mobility Overal bed mobility: Needs Assistance Bed Mobility: Supine to Sit;Sit to Supine     Supine to sit: Supervision Sit to supine: Supervision   General bed mobility comments: supervision for stability upon standing  Transfers Overall transfer level: Needs assistance   Transfers: Sit to/from Stand Sit to Stand: Min guard         General transfer comment: v/c's for safe hand placement    Balance Overall balance assessment: Needs assistance Sitting-balance support: No upper extremity supported Sitting balance-Leahy Scale: Good     Standing balance support: Single extremity supported Standing balance-Leahy Scale: Poor                              ADL Overall ADL's : Needs assistance/impaired     Grooming: Wash/dry hands;Minimal assistance;Standing Grooming Details (indicate cue  type and reason): (A) for balance at sink level                 Toilet Transfer: Minimal assistance;Ambulation Toilet Transfer Details (indicate cue type and reason): pt with LOB  with max (A) to correct. Pt with LOB on the R with scissored gait. Ambulated without RW         Functional mobility during ADLs: Maximal assistance General ADL Comments: Pt LOB with hand held (A) ambulation. pt with scissored gait affecting all adls     Vision Vision Assessment?: No apparent visual deficits   Perception     Praxis      Pertinent Vitals/Pain Pain Assessment: No/denies pain     Hand Dominance Right   Extremity/Trunk Assessment Upper Extremity Assessment Upper Extremity Assessment: RUE deficits/detail RUE Deficits / Details: dysmetria movement.        Cervical / Trunk Assessment Cervical / Trunk Assessment: Other exceptions (tightness on L side of neck)   Communication Communication Communication: No difficulties   Cognition Arousal/Alertness: Awake/alert Behavior During Therapy: WFL for tasks assessed/performed Overall Cognitive Status: Within Functional Limits for tasks assessed                     General Comments       Exercises       Shoulder Instructions      Home Living Family/patient expects to be discharged to:: Private residence Living Arrangements: Spouse/significant other Available Help at Discharge: Friend(s);Family;Other (Comment) Type of Home: House Home Access: Stairs to enter [employer] of Steps: 2 Entrance Stairs-Rails:  Right;Left Home Layout: One level     Bathroom Shower/Tub: Chief Strategy Officer: Standard     Home Equipment: None          Prior Functioning/Environment Level of Independence: Independent             OT Diagnosis: Generalized weakness   OT Problem List: Decreased strength;Decreased activity tolerance;Impaired balance (sitting and/or standing);Decreased safety  awareness;Decreased knowledge of use of DME or AE;Decreased knowledge of precautions;Decreased coordination   OT Treatment/Interventions: Self-care/ADL training;Therapeutic exercise;DME and/or AE instruction;Therapeutic activities;Patient/family education;Balance training    OT Goals(Current goals can be found in the care plan section) Acute Rehab OT Goals Patient Stated Goal: get home OT Goal Formulation: With patient Time For Goal Achievement: 07/05/15 Potential to Achieve Goals: Good  OT Frequency: Min 2X/week   Barriers to D/C:            Co-evaluation              End of Session Equipment Utilized During Treatment: Gait belt Nurse Communication: Mobility status;Precautions  Activity Tolerance: Patient tolerated treatment well Patient left: in bed;with call bell/phone within reach   Time: 1339-1357 OT Time Calculation (min): 18 min Charges:  OT General Charges $OT Visit: 1 Procedure G-Codes:    Harolyn Rutherford 07/04/15, 3:05 PM   Mateo Flow   OTR/L Pager: [phone] Office: [phone] .

## 2015-06-21 NOTE — Care Management Note (Signed)
Case Management Note  Patient Details  Name: JENNICE RENEGAR MRN: [id number] Date of Birth: 10-Jun-1951  Subjective/Objective:                    Action/Plan: Outpatient PT/OT were recommended. With patient's permission, electronic referral was sent to [facility]. CM will continue to follow for any additional discharge needs.  Expected Discharge Date:                  Expected Discharge Plan:  Home/Self Care  In-House Referral:     Discharge planning Services  CM Consult  Post Acute Care Choice:    Choice offered to:     DME Arranged:    DME Agency:     HH Arranged:    HH Agency:     Status of Service:  In process, will continue to follow  Medicare Important Message Given:    Date Medicare IM Given:    Medicare IM give by:    Date Additional Medicare IM Given:    Additional Medicare Important Message give by:     If discussed at Long Length of Stay Meetings, dates discussed:    Additional Comments:  Anda Kraft, RN 06/21/2015, 4:38 PM

## 2015-06-21 NOTE — Progress Notes (Signed)
[facility] PROGRESS NOTE  Catherine Floyd ZOX:[id number] DOB: 1951-03-14 DOA: 06/19/2015 PCP: [name], MD  Brief Summary  The patient is a 64 y.o. year-old female with history of hypertension who presented with right arm and leg weakness.She awoke around 9 AM on the date of admission and her right arm and leg would not work normally. She tried to walk to the bathroom but had to use all of her strength to remain upright. She did not fall but felt strongly that she would fall. She denied blurred vision, headache, slurred speech, numbness, tingling, face weakness.  She is a smoker and smokes one pack per day. She denies history of high cholesterol or diabetes. In the emergency department, her vital signs were notable for blood pressure of 213/93 which trended down to 193/84 without intervention, heart rate 50s to 80s, breathing comfortably on room air. Her labs were notable for mild hypokalemia with potassium 3.1. Her head CT demonstrated mild ethmoid sinus disease but no mass, hemorrhage, or evidence of acute infarct.   Assessment/Plan  Acute subcentimeter infract posterior limb of the left internal capsule with residual right deficits. MRA demonstrated accessory LEFT MCA with focal high grade stenosis of the distal left MCA M2 segment and focal high grade stenosis of left M2 segment.  She also had multiple stenoses of the posterior circulation including focal high grade stenosis LEFT P3 segment.   -  Echocardiogram  -carotid duplex are pending. - Continue Aspirin 325mg  daily - Started on high-dose statin - Hemoglobin A1c 5.5  - PT/OT/SLP -outpatient PT - Neurology consult appreciated - Urine drug screen neg, urinalysis neg -chest x-ray negative  Essential hypertension, blood pressure is trending down -  She has mild bradycardia despite not taking her calcium channel blocker or her beta blocker so will not resume these medications for now -  Resume doxazosin and  start Norvasc  Hypokalemia, - Oral potassium repletion x2 -check MG and repeat BMP  Tobacco abuse with withdrawal - continue nicotine patch - Counseled cessation   Diet: Healthy heart Access: PIV IVF: Off Proph: Lovenox  Code Status: partial Family Communication: patient alone Disposition Plan: transfer to telemetry  Consultants:  Neurology  Procedures:  CT head  Chest x-ray  MRI brain  MRA brain  Echocardiogram  Antibiotics:  None   HPI/Subjective:  Feeling better today   Objective: Filed Vitals:   06/21/15 0223 06/21/15 0609 06/21/15 1026 06/21/15 1325  BP: 123/61 156/82 127/67   Pulse: 55 56 61 64  Temp: 98 F (36.7 C) 98.1 F (36.7 C) 98.1 F (36.7 C) 98.7 F (37.1 C)  TempSrc: Oral Oral Oral Oral  Resp: 18 18 18 18   Height:      Weight:      SpO2: 97% 100% 97% 100%    Intake/Output Summary (Last 24 hours) at 06/21/15 1354 Last data filed at 06/20/15 1700  Gross per 24 hour  Intake    240 ml  Output      0 ml  Net    240 ml   Filed Weights   06/19/15 1257 06/19/15 1900 06/20/15 2114  Weight: 61.916 kg (136 lb 8 oz) 62.5 kg (137 lb 12.6 oz) 61.9 kg (136 lb 7.4 oz)   Body mass index is 24.95 kg/(m^2).  Exam:   General:  Thin adult female, No acute distress  Cardiovascular:  RRR, nl S1, S2 no mrg, 2+ pulses, warm extremities  Respiratory:  CTAB, no increased WOB  Abdomen:   NABS, soft,  NT/ND  MSK:   Normal tone and bulk, no LEE   Data Reviewed: Basic Metabolic Panel:  Recent Labs Lab 06/19/15 1308 06/19/15 1323 06/21/15 0652  NA 144 144 136  K 3.1* 3.1* 2.9*  CL 103 101 95*  CO2 30  --  32  GLUCOSE 113* 111* 89  BUN 8 10 10   CREATININE 0.63 0.70 0.64  CALCIUM 9.6  --  9.1   Liver Function Tests:  Recent Labs Lab 06/19/15 1308  AST 20  ALT 12*  ALKPHOS 82  BILITOT 0.3  PROT 6.9  ALBUMIN 3.8   No results for input(s): LIPASE, AMYLASE in the last 168 hours. No results for input(s): AMMONIA in the  last 168 hours. CBC:  Recent Labs Lab 06/19/15 1308 06/19/15 1323 06/21/15 0652  WBC 8.1  --  6.3  NEUTROABS 4.5  --   --   HGB 13.5 15.6* 12.5  HCT 40.7 46.0 38.8  MCV 92.3  --  92.6  PLT 343  --  310    Recent Results (from the past 240 hour(s))  MRSA PCR Screening     Status: None   Collection Time: 06/19/15  6:58 PM  Result Value Ref Range Status   MRSA by PCR NEGATIVE NEGATIVE Final    Comment:        The GeneXpert MRSA Assay (FDA approved for NASAL specimens only), is one component of a comprehensive MRSA colonization surveillance program. It is not intended to diagnose MRSA infection nor to guide or monitor treatment for MRSA infections.      Studies: Dg Chest 2 View  06/19/2015   CLINICAL DATA:  Hypertension  EXAM: CHEST  2 VIEW  COMPARISON:  None.  FINDINGS: Lungs are clear.  No pleural effusion or pneumothorax.  The heart is normal in size.  Visualized osseous structures are within normal limits.  IMPRESSION: No evidence of acute cardiopulmonary disease.   Electronically Signed   By: Charline Bills M.D.   On: 06/19/2015 21:44   Ct Head Wo Contrast  06/19/2015   CLINICAL DATA:  One day history of right-sided weakness and gait disturbance  EXAM: CT HEAD WITHOUT CONTRAST  TECHNIQUE: Contiguous axial images were obtained from the base of the skull through the vertex without intravenous contrast.  COMPARISON:  None.  FINDINGS: The ventricles are normal in size and configuration. There is no intracranial mass, hemorrhage, extra-axial fluid collection, or midline shift. Gray-white compartments appear normal. No acute infarct evident. The bony calvarium appears intact. The mastoid air cells are clear. There is mild mucosal thickening in multiple ethmoid air cells bilaterally.  IMPRESSION: Mild ethmoid sinus disease. No intracranial mass, hemorrhage, or extra-axial fluid collection. No focal gray - white compartment lesions/acute appearing infarct.   Electronically Signed    By: Bretta Bang III M.D.   On: 06/19/2015 15:20   Mr Brain Wo Contrast  06/19/2015   CLINICAL DATA:  Awoke at 9 a.m. this morning with RIGHT-sided weakness and difficulty walking. History of hypertension.  EXAM: MRI HEAD WITHOUT CONTRAST  MRA HEAD WITHOUT CONTRAST  TECHNIQUE: Multiplanar, multiecho pulse sequences of the brain and surrounding structures were obtained without intravenous contrast. Angiographic images of the head were obtained using MRA technique without contrast.  COMPARISON:  CT head June 19, 2015 at new 1521 hours  FINDINGS: MRI HEAD FINDINGS  The ventricles and sulci are normal for patient's age. No suspicious parenchymal signal, mass lesions, mass effect. Patchy LEFT greater than RIGHT periventricular RIGHT FLAIR T2  hyperintensities. Multiple tiny T2 hyperintensities in the pons. 6 mm focus of acute ischemia posterior limb of the internal capsule, with faint T2 bright signal. No susceptibility artifact to suggest hemorrhage.  No abnormal extra-axial fluid collections. No extra-axial masses though, contrast enhanced sequences would be more sensitive. Normal major intracranial vascular flow voids seen at the skull base.  Ocular globes and orbital contents are unremarkable though not tailored for evaluation. No abnormal sellar expansion. Visualized paranasal sinuses and mastoid air cells are well-aerated. No suspicious calvarial bone marrow signal. No abnormal sellar expansion. Craniocervical junction maintained.  MRA HEAD FINDINGS  Anterior circulation: Normal flow related enhancement of the included cervical, petrous, cavernous and supra clinoid internal carotid arteries. Patent anterior communicating artery. Accessory LEFT MCA arising proximal to the carotid terminus. Focal high-grade stenosis of the distal LEFT middle cerebral artery, proximal M2 segment. Focal high-grade stenosis LEFT M 3 segment.  No large vessel occlusion, abnormal luminal irregularity, aneurysm.  Posterior  circulation: RIGHT vertebral artery is dominant. Basilar artery is patent, with normal flow related enhancement of the main branch vessels. Flow related enhancement of the posterior cerebral arteries. Moderate stenosis proximal RIGHT P2 segment. Focal moderate stenosis LEFT P3 segment, high-grade stenosis distal LEFT P3 segment.  No large vessel occlusion, abnormal luminal irregularity, aneurysm.  IMPRESSION: MRI HEAD: Acute subcentimeter infarct posterior limb of the LEFT internal capsule.  Mild to moderate white matter changes compatible chronic small vessel ischemic disease.  MRA HEAD: Accessory LEFT MCA (for purposes of this report described as proximal and distal), with focal high-grade stenosis of distal LEFT MCA M2 segment. Focal high-grade stenosis LEFT M3 segment.  Multiple stenosis of the posterior circulation including focal high-grade stenosis LEFT P3 segment.  No acute large vessel occlusion.   Electronically Signed   By: Awilda Metro M.D.   On: 06/19/2015 22:46   Mr Maxine Glenn Head/brain Wo Cm  06/19/2015   CLINICAL DATA:  Awoke at 9 a.m. this morning with RIGHT-sided weakness and difficulty walking. History of hypertension.  EXAM: MRI HEAD WITHOUT CONTRAST  MRA HEAD WITHOUT CONTRAST  TECHNIQUE: Multiplanar, multiecho pulse sequences of the brain and surrounding structures were obtained without intravenous contrast. Angiographic images of the head were obtained using MRA technique without contrast.  COMPARISON:  CT head June 19, 2015 at new 1521 hours  FINDINGS: MRI HEAD FINDINGS  The ventricles and sulci are normal for patient's age. No suspicious parenchymal signal, mass lesions, mass effect. Patchy LEFT greater than RIGHT periventricular RIGHT FLAIR T2 hyperintensities. Multiple tiny T2 hyperintensities in the pons. 6 mm focus of acute ischemia posterior limb of the internal capsule, with faint T2 bright signal. No susceptibility artifact to suggest hemorrhage.  No abnormal extra-axial fluid  collections. No extra-axial masses though, contrast enhanced sequences would be more sensitive. Normal major intracranial vascular flow voids seen at the skull base.  Ocular globes and orbital contents are unremarkable though not tailored for evaluation. No abnormal sellar expansion. Visualized paranasal sinuses and mastoid air cells are well-aerated. No suspicious calvarial bone marrow signal. No abnormal sellar expansion. Craniocervical junction maintained.  MRA HEAD FINDINGS  Anterior circulation: Normal flow related enhancement of the included cervical, petrous, cavernous and supra clinoid internal carotid arteries. Patent anterior communicating artery. Accessory LEFT MCA arising proximal to the carotid terminus. Focal high-grade stenosis of the distal LEFT middle cerebral artery, proximal M2 segment. Focal high-grade stenosis LEFT M 3 segment.  No large vessel occlusion, abnormal luminal irregularity, aneurysm.  Posterior circulation: RIGHT vertebral artery is  dominant. Basilar artery is patent, with normal flow related enhancement of the main branch vessels. Flow related enhancement of the posterior cerebral arteries. Moderate stenosis proximal RIGHT P2 segment. Focal moderate stenosis LEFT P3 segment, high-grade stenosis distal LEFT P3 segment.  No large vessel occlusion, abnormal luminal irregularity, aneurysm.  IMPRESSION: MRI HEAD: Acute subcentimeter infarct posterior limb of the LEFT internal capsule.  Mild to moderate white matter changes compatible chronic small vessel ischemic disease.  MRA HEAD: Accessory LEFT MCA (for purposes of this report described as proximal and distal), with focal high-grade stenosis of distal LEFT MCA M2 segment. Focal high-grade stenosis LEFT M3 segment.  Multiple stenosis of the posterior circulation including focal high-grade stenosis LEFT P3 segment.  No acute large vessel occlusion.   Electronically Signed   By: Awilda Metro M.D.   On: 06/19/2015 22:46     Scheduled Meds: .  stroke: mapping our early stages of recovery book   Does not apply Once  . amLODipine  5 mg Oral Daily  . aspirin  300 mg Rectal Daily   Or  . aspirin  325 mg Oral Daily  . atorvastatin  80 mg Oral q1800  . doxazosin  4 mg Oral Daily  . enoxaparin (LOVENOX) injection  30 mg Subcutaneous Q24H  . hydrochlorothiazide  12.5 mg Oral Daily  . multivitamin with minerals  1 tablet Oral Daily  . nicotine  21 mg Transdermal Daily  . potassium chloride  40 mEq Oral Q4H   Continuous Infusions:   Principal Problem:   Stroke Active Problems:   Essential hypertension   Hypokalemia   Tobacco abuse   Cerebral infarction due to thrombosis of left middle cerebral artery    Time spent: 30 min    Taliyah Watrous  [facility] Pager [phone]. If 7PM-7AM, please contact night-coverage at [email or web address], password [facility] 06/21/2015, 1:54 PM  LOS: 2 days

## 2015-06-22 DIAGNOSIS — I639 Cerebral infarction, unspecified: Secondary | ICD-10-CM

## 2015-06-22 DIAGNOSIS — I10 Essential (primary) hypertension: Secondary | ICD-10-CM

## 2015-06-22 DIAGNOSIS — Z720 Tobacco use: Secondary | ICD-10-CM

## 2015-06-22 LAB — CBC
HCT: 40.1 % (ref 36.0–46.0)
Hemoglobin: 13 g/dL (ref 12.0–15.0)
MCH: 30.3 pg (ref 26.0–34.0)
MCHC: 32.4 g/dL (ref 30.0–36.0)
MCV: 93.5 fL (ref 78.0–100.0)
Platelets: 284 10*3/uL (ref 150–400)
RBC: 4.29 MIL/uL (ref 3.87–5.11)
RDW: 13.2 % (ref 11.5–15.5)
WBC: 6.2 10*3/uL (ref 4.0–10.5)

## 2015-06-22 LAB — BASIC METABOLIC PANEL
Anion gap: 5 (ref 5–15)
BUN: 12 mg/dL (ref 6–20)
CO2: 32 mmol/L (ref 22–32)
Calcium: 9.3 mg/dL (ref 8.9–10.3)
Chloride: 104 mmol/L (ref 101–111)
Creatinine, Ser: 0.67 mg/dL (ref 0.44–1.00)
GFR calc Af Amer: >60 mL/min (ref >60)
GFR calc non Af Amer: >60 mL/min (ref >60)
Glucose, Bld: 90 mg/dL (ref 65–99)
Potassium: 4.2 mmol/L (ref 3.5–5.1)
Sodium: 141 mmol/L (ref 135–145)

## 2015-06-22 MED ORDER — AMLODIPINE BESYLATE 5 MG PO TABS: 5.0000 mg | ORAL_TABLET | Freq: Every day | ORAL | Status: DC

## 2015-06-22 MED ORDER — ATORVASTATIN CALCIUM 80 MG PO TABS: 80.0000 mg | ORAL_TABLET | Freq: Every day | ORAL | Status: DC

## 2015-06-22 MED ORDER — ASPIRIN 325 MG PO TABS: 325.0000 mg | ORAL_TABLET | Freq: Every day | ORAL | Status: AC

## 2015-06-22 MED ORDER — NICOTINE 21 MG/24HR TD PT24: 21.0000 mg | MEDICATED_PATCH | Freq: Every day | TRANSDERMAL | Status: DC

## 2015-06-22 NOTE — Progress Notes (Signed)
Patient being discharged home IV removed and patient is alert and oriented with no complaint of pain. Discharge summary reviewed daughter is transporting her home.

## 2015-06-22 NOTE — Discharge Summary (Signed)
Physician Discharge Summary  Catherine Floyd:[id number] DOB: 1951/09/30 DOA: 06/19/2015  PCP: [name], MD  Admit date: 06/19/2015 Discharge date: 06/22/2015  Time spent: 35 minutes  Recommendations for Outpatient Follow-up:  1.   Discharge Diagnoses:  Principal Problem:   Stroke Active Problems:   Essential hypertension   Hypokalemia   Tobacco abuse   Cerebral infarction due to thrombosis of left middle cerebral artery   Discharge Condition: improved  Diet recommendation: cardiac  Filed Weights   06/19/15 1257 06/19/15 1900 06/20/15 2114  Weight: 61.916 kg (136 lb 8 oz) 62.5 kg (137 lb 12.6 oz) 61.9 kg (136 lb 7.4 oz)    History of present illness:  The patient is a 64 y.o. year-old female with history of hypertension who presents with right arm and leg weakness. The patient was last at their baseline health yesterday when she went to bed. This morning when she awoke around 9 AM, her right arm and leg would not work normally. She tried to walk to the bathroom but had to use all of her strength to remain upright. She did not fall but felt strongly that she would fall. She denies room tilting sensation, blurred vision, headache, slurred speech, numbness, tingling. She denied any face weakness. She denies recent fevers, chills, nausea, vomiting, diarrhea, cough, dysuria, stress, new medication changes. She has had high blood pressure for almost 20 years which she states is normally well controlled when she goes to clinic and has been taking her medications. She states she took her medications this morning for blood pressure but she had not taken them for the last 3 days. She is a smoker and smokes one pack per day. She denies history of high cholesterol or diabetes.   In the emergency department, her vital signs were notable for blood pressure of 213/93 which trended down to 193/84 without intervention, heart rate 50s to 80s, breathing comfortably on room air. Her labs  were notable for mild hypokalemia with potassium 3.1. Her head CT demonstrated mild ethmoid sinus disease but no mass, hemorrhage, or evidence of acute infarct. Urinalysis and urine drug screen are pending. She is being admitted for stroke evaluation to stepdown secondary to persistently high blood pressures in the emergency department.  Hospital Course:  Acute subcentimeter infract posterior limb of the left internal capsule with residual right deficits. MRA demonstrated accessory LEFT MCA with focal high grade stenosis of the distal left MCA M2 segment and focal high grade stenosis of left M2 segment. She also had multiple stenoses of the posterior circulation including focal high grade stenosis LEFT P3 segment.  - Echocardiogram  -carotid duplex: Bilateral: 1-39% ICA stenosis. Vertebral artery flow is antegrade - Continue Aspirin  daily - Started on high-dose statin - Hemoglobin A1c 5.5  - PT/OT/SLP -outpatient PT - Neurology consult appreciated - Urine drug screen neg, urinalysis neg -chest x-ray negative  Essential hypertension, blood pressure is trending down - She has mild bradycardia despite not taking her calcium channel blocker or her beta blocker so will not resume these medications for now - Resume doxazosin and start Norvasc  Hypokalemia, - Oral potassium repletion x2   Tobacco abuse with withdrawal - continue nicotine patch - Counseled cessation     Consultants:  Neurology  Procedures:  CT head  Chest x-ray  MRI brain  MRA brain  Echocardiogram   Discharge Exam: Filed Vitals:   06/22/15 0521  BP: 144/76  Pulse: 58  Temp: 98.3 F (36.8 C)  Resp: 20  General: awake, NAD   Discharge Instructions   Discharge Instructions    Ambulatory referral to Neurology    Complete by:  As directed   Pt will follow up with Dr. [name] at [facility] in about 2 months. Thanks.     Diet - low sodium heart healthy    Complete by:  As  directed      Discharge instructions    Complete by:  As directed   Outpatient PT     Increase activity slowly    Complete by:  As directed           Current Discharge Medication List    START taking these medications   Details  amLODipine (NORVASC) 5 MG tablet Take 1 tablet (5 mg total) by mouth daily. Qty: 30 tablet, Refills: 0    aspirin 325 MG tablet Take 1 tablet (325 mg total) by mouth daily. Qty: 30 tablet, Refills: 0    atorvastatin (LIPITOR) 80 MG tablet Take 1 tablet (80 mg total) by mouth daily at 6 PM. Qty: 30 tablet, Refills: 0    nicotine (NICODERM CQ - DOSED IN MG/24 HOURS) 21 mg/24hr patch Place 1 patch (21 mg total) onto the skin daily. Qty: 28 patch, Refills: 0      CONTINUE these medications which have NOT CHANGED   Details  doxazosin (CARDURA) 4 MG tablet Take 4 mg by mouth daily.    hydrochlorothiazide (HYDRODIURIL) 12.5 MG tablet Take 12.5 mg by mouth daily.    Multiple Vitamin (MULTIVITAMIN) tablet Take 1 tablet by mouth daily.      STOP taking these medications     Aspirin-Acetaminophen (GOODYS BODY PAIN PO)      metoprolol succinate (TOPROL-XL) 50 MG 24 hr tablet      nisoldipine (SULAR) 20 MG 24 hr tablet        Allergies  Allergen Reactions  . Penicillins   . Sulfa Antibiotics    Follow-up Information    Follow up with [facility]   Specialty:  Rehabilitation   Why:  office to call with appointment date and time   Contact information:   [address] [id number] mc [place] [address] [phone]      Follow up with [name],[name], MD. Schedule an appointment as soon as possible for a visit in 2 months.   Specialty:  Neurology   Why:  stroke clinic   Contact information:   [address] [place] [address] [phone]       Follow up with [name],[name], MD In 1 week.   Specialty:  Family Medicine   Contact information:   [address] [place] [address] [phone]        The results of significant diagnostics from this hospitalization (including imaging, microbiology, ancillary and laboratory) are listed below for reference.    Significant Diagnostic Studies: Dg Chest 2 View  06/19/2015   CLINICAL DATA:  Hypertension  EXAM: CHEST  2 VIEW  COMPARISON:  None.  FINDINGS: Lungs are clear.  No pleural effusion or pneumothorax.  The heart is normal in size.  Visualized osseous structures are within normal limits.  IMPRESSION: No evidence of acute cardiopulmonary disease.   Electronically Signed   By: Charline Bills M.D.   On: 06/19/2015 21:44   Ct Head Wo Contrast  06/19/2015   CLINICAL DATA:  One day history of right-sided weakness and gait disturbance  EXAM: CT HEAD WITHOUT CONTRAST  TECHNIQUE: Contiguous axial images were obtained from the base of the skull through the vertex without intravenous contrast.  COMPARISON:  None.  FINDINGS: The ventricles are normal in size and configuration. There is no intracranial mass, hemorrhage, extra-axial fluid collection, or midline shift. Gray-white compartments appear normal. No acute infarct evident. The bony calvarium appears intact. The mastoid air cells are clear. There is mild mucosal thickening in multiple ethmoid air cells bilaterally.  IMPRESSION: Mild ethmoid sinus disease. No intracranial mass, hemorrhage, or extra-axial fluid collection. No focal gray - white compartment lesions/acute appearing infarct.   Electronically Signed   By: Bretta Bang III M.D.   On: 06/19/2015 15:20   Mr Brain Wo Contrast  06/19/2015   CLINICAL DATA:  Awoke at 9 a.m. this morning with RIGHT-sided weakness and difficulty walking. History of hypertension.  EXAM: MRI HEAD WITHOUT CONTRAST  MRA HEAD WITHOUT CONTRAST  TECHNIQUE: Multiplanar, multiecho pulse sequences of the brain and surrounding structures were obtained without intravenous contrast. Angiographic images of the  head were obtained using MRA technique without contrast.  COMPARISON:  CT head June 19, 2015 at new 1521 hours  FINDINGS: MRI HEAD FINDINGS  The ventricles and sulci are normal for patient's age. No suspicious parenchymal signal, mass lesions, mass effect. Patchy LEFT greater than RIGHT periventricular RIGHT FLAIR T2 hyperintensities. Multiple tiny T2 hyperintensities in the pons. 6 mm focus of acute ischemia posterior limb of the internal capsule, with faint T2 bright signal. No susceptibility artifact to suggest hemorrhage.  No abnormal extra-axial fluid collections. No extra-axial masses though, contrast enhanced sequences would be more sensitive. Normal major intracranial vascular flow voids seen at the skull base.  Ocular globes and orbital contents are unremarkable though not tailored for evaluation. No abnormal sellar expansion. Visualized paranasal sinuses and mastoid air cells are well-aerated. No suspicious calvarial bone marrow signal. No abnormal sellar expansion. Craniocervical junction maintained.  MRA HEAD FINDINGS  Anterior circulation: Normal flow related enhancement of the included cervical, petrous, cavernous and supra clinoid internal carotid arteries. Patent anterior communicating artery. Accessory LEFT MCA arising proximal to the carotid terminus. Focal high-grade stenosis of the distal LEFT middle cerebral artery, proximal M2 segment. Focal high-grade stenosis LEFT M 3 segment.  No large vessel occlusion, abnormal luminal irregularity, aneurysm.  Posterior circulation: RIGHT vertebral artery is dominant. Basilar artery is patent, with normal flow related enhancement of the main branch vessels. Flow related enhancement of the posterior cerebral arteries. Moderate stenosis proximal RIGHT P2 segment. Focal moderate stenosis LEFT P3 segment, high-grade stenosis distal LEFT P3 segment.  No large vessel occlusion, abnormal luminal irregularity, aneurysm.  IMPRESSION: MRI HEAD: Acute subcentimeter  infarct posterior limb of the LEFT internal capsule.  Mild to moderate white matter changes compatible chronic small vessel ischemic disease.  MRA HEAD: Accessory LEFT MCA (for purposes of this report described as proximal and distal), with focal high-grade stenosis of distal LEFT MCA M2 segment. Focal high-grade stenosis LEFT M3 segment.  Multiple stenosis of the posterior circulation including focal high-grade stenosis LEFT P3 segment.  No acute large vessel occlusion.   Electronically Signed   By: Awilda Metro M.D.   On: 06/19/2015 22:46   Mr Maxine Glenn Head/brain Wo Cm  06/19/2015   CLINICAL DATA:  Awoke at 9 a.m. this morning with RIGHT-sided weakness and difficulty walking. History of hypertension.  EXAM: MRI HEAD WITHOUT CONTRAST  MRA HEAD WITHOUT CONTRAST  TECHNIQUE: Multiplanar, multiecho pulse sequences of the brain and surrounding structures were obtained without intravenous contrast.  Angiographic images of the head were obtained using MRA technique without contrast.  COMPARISON:  CT head June 19, 2015 at new 1521 hours  FINDINGS: MRI HEAD FINDINGS  The ventricles and sulci are normal for patient's age. No suspicious parenchymal signal, mass lesions, mass effect. Patchy LEFT greater than RIGHT periventricular RIGHT FLAIR T2 hyperintensities. Multiple tiny T2 hyperintensities in the pons. 6 mm focus of acute ischemia posterior limb of the internal capsule, with faint T2 bright signal. No susceptibility artifact to suggest hemorrhage.  No abnormal extra-axial fluid collections. No extra-axial masses though, contrast enhanced sequences would be more sensitive. Normal major intracranial vascular flow voids seen at the skull base.  Ocular globes and orbital contents are unremarkable though not tailored for evaluation. No abnormal sellar expansion. Visualized paranasal sinuses and mastoid air cells are well-aerated. No suspicious calvarial bone marrow signal. No abnormal sellar expansion. Craniocervical  junction maintained.  MRA HEAD FINDINGS  Anterior circulation: Normal flow related enhancement of the included cervical, petrous, cavernous and supra clinoid internal carotid arteries. Patent anterior communicating artery. Accessory LEFT MCA arising proximal to the carotid terminus. Focal high-grade stenosis of the distal LEFT middle cerebral artery, proximal M2 segment. Focal high-grade stenosis LEFT M 3 segment.  No large vessel occlusion, abnormal luminal irregularity, aneurysm.  Posterior circulation: RIGHT vertebral artery is dominant. Basilar artery is patent, with normal flow related enhancement of the main branch vessels. Flow related enhancement of the posterior cerebral arteries. Moderate stenosis proximal RIGHT P2 segment. Focal moderate stenosis LEFT P3 segment, high-grade stenosis distal LEFT P3 segment.  No large vessel occlusion, abnormal luminal irregularity, aneurysm.  IMPRESSION: MRI HEAD: Acute subcentimeter infarct posterior limb of the LEFT internal capsule.  Mild to moderate white matter changes compatible chronic small vessel ischemic disease.  MRA HEAD: Accessory LEFT MCA (for purposes of this report described as proximal and distal), with focal high-grade stenosis of distal LEFT MCA M2 segment. Focal high-grade stenosis LEFT M3 segment.  Multiple stenosis of the posterior circulation including focal high-grade stenosis LEFT P3 segment.  No acute large vessel occlusion.   Electronically Signed   By: Awilda Metro M.D.   On: 06/19/2015 22:46    Microbiology: Recent Results (from the past 240 hour(s))  MRSA PCR Screening     Status: None   Collection Time: 06/19/15  6:58 PM  Result Value Ref Range Status   MRSA by PCR NEGATIVE NEGATIVE Final    Comment:        The GeneXpert MRSA Assay (FDA approved for NASAL specimens only), is one component of a comprehensive MRSA colonization surveillance program. It is not intended to diagnose MRSA infection nor to guide or monitor  treatment for MRSA infections.      Labs: Basic Metabolic Panel:  Recent Labs Lab 06/19/15 1308 06/19/15 1323 06/21/15 0652 06/21/15 1427 06/22/15 0655  NA 144 144 136 138 141  K 3.1* 3.1* 2.9* 3.6 4.2  CL 103 101 95* 100* 104  CO2 30  --  32 29 32  GLUCOSE 113* 111* 89 109* 90  BUN 8 10 10 10 12   CREATININE 0.63 0.70 0.64 0.59 0.67  CALCIUM 9.6  --  9.1 9.7 9.3  MG  --   --   --  2.0  --    Liver Function Tests:  Recent Labs Lab 06/19/15 1308  AST 20  ALT 12*  ALKPHOS 82  BILITOT 0.3  PROT 6.9  ALBUMIN 3.8   No results for input(s): LIPASE, AMYLASE in  the last 168 hours. No results for input(s): AMMONIA in the last 168 hours. CBC:  Recent Labs Lab 06/19/15 1308 06/19/15 1323 06/21/15 0652 06/22/15 0655  WBC 8.1  --  6.3 6.2  NEUTROABS 4.5  --   --   --   HGB 13.5 15.6* 12.5 13.0  HCT 40.7 46.0 38.8 40.1  MCV 92.3  --  92.6 93.5  PLT 343  --  310 284   Cardiac Enzymes: No results for input(s): CKTOTAL, CKMB, CKMBINDEX, TROPONINI in the last 168 hours. BNP: BNP (last 3 results) No results for input(s): BNP in the last 8760 hours.  ProBNP (last 3 results) No results for input(s): PROBNP in the last 8760 hours.  CBG: No results for input(s): GLUCAP in the last 168 hours.     SignedMarlin Canary  [facility] 06/22/2015, 8:02 AM

## 2015-06-23 ENCOUNTER — Encounter: Payer: Self-pay | Admitting: *Deleted

## 2015-06-23 DIAGNOSIS — Z006 Encounter for examination for normal comparison and control in clinical research program: Secondary | ICD-10-CM

## 2015-06-23 NOTE — Progress Notes (Signed)
STROKE AF Informed Consent   Subject Name: Catherine Floyd  Subject met inclusion and exclusion criteria.  The informed consent form, study requirements and expectations were reviewed with the subject and questions and concerns were addressed prior to the signing of the consent form.  The subject verbalized understanding of the trail requirements.  The subject agreed to participate in the STROKE AF trial and signed the informed consent.  The informed consent was obtained prior to performance of any protocol-specific procedures for the subject.  A copy of the signed informed consent was given to the subject and a copy was placed in the subject's medical record.  Pamala Duffel 06/23/2015, 14:05 PM   Study lab work drawn by [place] Chalmers, [employer].

## 2015-07-07 ENCOUNTER — Encounter: Payer: Self-pay | Admitting: *Deleted

## 2015-07-07 ENCOUNTER — Other Ambulatory Visit: Payer: Self-pay | Admitting: *Deleted

## 2015-07-07 NOTE — Patient Outreach (Signed)
[facility]) Care Management  07/07/2015  Catherine Floyd 09-15-1951 [id number]   Triggered RED on EMMI Stroke Dashboard, notification sent to Colleen Can, RN for outreach.  Thanks, [name]. [name] [facility] Care Management [facility] CM Assistant Phone: [phone] Fax: [phone]

## 2015-07-07 NOTE — Patient Outreach (Signed)
[facility]) Care Management  07/07/2015  Catherine Floyd 1950/11/09 [id number]  EMMI-stroke referral:  Telephone call to patient who was advised of reason for call. Patient voices that she was previously admitted to hospital from 08/15-08/18/2016 with right sided weakness, balance off and inability to walk. States she was diagnosed with stroke.  States she has history of hypertension and admits to stress and not taking her medication for blood pressure consistently.  States she was frequently driving to see her mother for long periods and did not want to make frequent stops because results of taking diuretic.   States she is taking all medications as prescribed now and understands the importance of doing so. Has already seen primary care provider post hospital stay and there were no changes in medications. States she has follow up appointment made with neurologist for Nov 3rd. States she plans to call to make appointment to start outpatient rehabilitation. States she has right sided weakness.   Plan: follow up with patient next week. Follow care plan as noted. Patient agrees with set appointment. Colleen Can, RN BSN CCM Care Management Coordinator [facility] Care Management  [phone]

## 2015-07-13 ENCOUNTER — Ambulatory Visit: Payer: BLUE CROSS/BLUE SHIELD | Admitting: Occupational Therapy

## 2015-07-13 ENCOUNTER — Encounter: Payer: Self-pay | Admitting: Physical Therapy

## 2015-07-13 ENCOUNTER — Ambulatory Visit: Payer: BLUE CROSS/BLUE SHIELD | Attending: Family Medicine | Admitting: Physical Therapy

## 2015-07-13 DIAGNOSIS — R531 Weakness: Secondary | ICD-10-CM | POA: Insufficient documentation

## 2015-07-13 DIAGNOSIS — R41841 Cognitive communication deficit: Secondary | ICD-10-CM | POA: Insufficient documentation

## 2015-07-13 DIAGNOSIS — R279 Unspecified lack of coordination: Secondary | ICD-10-CM

## 2015-07-13 DIAGNOSIS — R201 Hypoesthesia of skin: Secondary | ICD-10-CM

## 2015-07-13 DIAGNOSIS — R42 Dizziness and giddiness: Secondary | ICD-10-CM | POA: Insufficient documentation

## 2015-07-13 DIAGNOSIS — I679 Cerebrovascular disease, unspecified: Secondary | ICD-10-CM | POA: Diagnosis present

## 2015-07-13 DIAGNOSIS — G8191 Hemiplegia, unspecified affecting right dominant side: Secondary | ICD-10-CM

## 2015-07-13 DIAGNOSIS — R2681 Unsteadiness on feet: Secondary | ICD-10-CM | POA: Diagnosis present

## 2015-07-13 DIAGNOSIS — G8190 Hemiplegia, unspecified affecting unspecified side: Secondary | ICD-10-CM | POA: Insufficient documentation

## 2015-07-13 DIAGNOSIS — R278 Other lack of coordination: Secondary | ICD-10-CM

## 2015-07-13 DIAGNOSIS — IMO0002 Reserved for concepts with insufficient information to code with codable children: Secondary | ICD-10-CM

## 2015-07-13 DIAGNOSIS — R41842 Visuospatial deficit: Secondary | ICD-10-CM

## 2015-07-13 DIAGNOSIS — I69898 Other sequelae of other cerebrovascular disease: Secondary | ICD-10-CM | POA: Insufficient documentation

## 2015-07-13 DIAGNOSIS — I6931 Cognitive deficits following cerebral infarction: Secondary | ICD-10-CM | POA: Insufficient documentation

## 2015-07-13 DIAGNOSIS — I69319 Unspecified symptoms and signs involving cognitive functions following cerebral infarction: Secondary | ICD-10-CM

## 2015-07-13 DIAGNOSIS — R269 Unspecified abnormalities of gait and mobility: Secondary | ICD-10-CM | POA: Diagnosis present

## 2015-07-13 NOTE — Therapy (Signed)
[facility] [address] [place], [place], [address] Phone: [phone]   Fax:  [phone]  Occupational Therapy Evaluation  Patient Details  Name: Catherine Floyd MRN: [id number] Date of Birth: 04/10/1951 Referring Provider:  [name], MD  Encounter Date: 07/13/2015      OT End of Session - 07/13/15 1555    Visit Number 1   Number of Visits 17   Authorization Type BCBS/ Medicare   Authorization Time Period 60 days long term goals due 09/10/15   OT Start Time 0850   OT Stop Time 0930   OT Time Calculation (min) 40 min   Activity Tolerance Patient tolerated treatment well   Behavior During Therapy [facility] for tasks assessed/performed      Past Medical History  Diagnosis Date  . Hypertension   . Cigarette nicotine dependence     Past Surgical History  Procedure Laterality Date  . Abdominal hysterectomy    . Appendectomy    . Tubal ligation      There were no vitals filed for this visit.  Visit Diagnosis:  Hemiparesis, right  Impaired sensation  Decreased coordination  Cognitive deficits following cerebral infarction      Subjective Assessment - 07/13/15 0851    Subjective  Pt reports stiffness in a.m.   Pertinent History see Epic   Currently in Pain? No/denies   Multiple Pain Sites No           [facility] OT Assessment - 07/13/15 0854    Assessment   Diagnosis CVA   Onset Date 06/19/15   Assessment Pt s/p CVA on 8/15/ 16 after not taking meds for several days   Prior Therapy acute OT   Precautions   Precautions Fall   Balance Screen   Has the patient fallen in the past 6 months Yes   How many times? 2   Has the patient had a decrease in activity level because of a fear of falling?  Yes   Is the patient reluctant to leave their home because of a fear of falling?  No   Home  Environment   Family/patient expects to be discharged to: Private residence   Living Arrangements  Non-relatives/Friends   Available Help at Discharge Friend(s)   Type of Home House   Home Access Stairs   Home Layout One level   Prior Function   Level of Independence Independent   Vocation Full time employment   Vocation Requirements Overnight stocker   Leisure watch TV; crochet   ADL   ADL comments Pt is performs all basic ADLS modified independently   IADL   Shopping Shops independently for small purchases   Light Housekeeping Performs light daily tasks such as dishwashing, bed making   Meal Prep Able to complete simple warm meal prep  needs supervision   Mobility   Mobility Status Independent   Written Expression   Dominant Hand Right   Handwriting 100% legible;Increased time   Vision Assessment   Vision Assessment Vision impaired  _ to be further tested in functional context   Tracking/Visual Pursuits Able to track stimulus in all quads without difficulty   Visual Fields No apparent deficits   Comment Pt reports blurry vision when tracking to right side   Cognition   Overall Cognitive Status Impaired/Different from baseline   Mini Mental State Exam  MOCHA: 20/30, unable to perform trail making, copy cube, difficlty copying clock   Area of Impairment Attention;Memory   Current  Attention Level Selective   Attention Comments impaired alternating/ divided   Memory Comments 4/5 items recalled with short delay   Executive Function --  unable to perform trailmaking   Coordination   Fine Motor Movements are Fluid and Coordinated No   9 Hole Peg Test Right;Left   Right 9 Hole Peg Test 34.50  with 2 drops   Left 9 Hole Peg Test 37.0   Strength   Overall Strength Deficits   Overall Strength Comments RUE grossly 4/5, LUE 5/5           Vestibular Assessment - 07/13/15 0001    Vestibular Assessment   General Observation decreased gait stability with head turns (horizontal > vertical). Pt reports feeling of "heaviness" of head with head turns   Symptom Behavior   Type  of Dizziness "Funny feeling in head"  "heaviness" in head with functional head turns   Duration of Dizziness seconds   Aggravating Factors Turning head quickly   Relieving Factors Head stationary                        OT Short Term Goals - 07/13/15 1621    OT SHORT TERM GOAL #1   Title I with HEP.   Time 4   Period Weeks   Status New   OT SHORT TERM GOAL #2   Title Pt will demonstrate ability to perform basic cooking task modified independently demonstrating good safety awareness.   Time 4   Period Weeks   Status New   OT SHORT TERM GOAL #3   Title Pt will improve RUE fine motor coordiantion as evidenced by decreasing  9 hole peg test score to 30 secs without drops.   Time 4   Period Weeks   Status New           OT Long Term Goals - 07/13/15 1624    OT LONG TERM GOAL #1   Title Pt will demonstrate ability to perform mod complex cooking( 2 items simultaneously) modified independently demonstrating good safety awareness.   Baseline due 09/10/15   Time 8   Period Weeks   Status New   OT LONG TERM GOAL #2   Title Pt will perform simulated work activities at a modified independent level including placing items that ar 5 lbs on over head shelf with RUE without drops.   Time 8   Period Weeks   Status New   OT LONG TERM GOAL #3   Title Pt will demonstrate ability to carry 20 lbs, 25 ft x 3 without rest break.   Time 8   Period Weeks   Status New   OT LONG TERM GOAL #4   Title Pt will demonstrate improved visual perceptual skills as evidenced by copying a simple design with 95% or better accuracy.   Time 8   Period Weeks   Status New   OT LONG TERM GOAL #5   Title Pt will use RUE as her dominant hand at least 90% of the time for ADLs/ Functional activities.   Time 8   Period Weeks   Status New               Plan - 07/13/15 1618    Clinical Impression Statement Pt s/p CVA on 8/15/ 16 after not taking BP meds for several days, presents with  decreased RUe coordinzation and strength, cognitive and visual perceptual deficits which impede ADLs/ IADLs.   Pt will benefit from skilled  therapeutic intervention in order to improve on the following deficits (Retired) Decreased coordination;Decreased range of motion;Difficulty walking;Decreased endurance;Decreased safety awareness;Impaired sensation;Decreased knowledge of precautions;Decreased activity tolerance;Decreased balance;Decreased knowledge of use of DME;Impaired UE functional use;Impaired vision/preception;Impaired perceived functional ability;Decreased strength;Decreased mobility;Decreased cognition   Rehab Potential Good   OT Frequency 2x / week  plus eval   OT Duration 8 weeks   OT Treatment/Interventions Self-care/ADL training;Therapeutic exercise;Patient/family education;Functional Mobility Training;Balance training;Manual Therapy;Neuromuscular education;Ultrasound;Energy conservation;Therapeutic exercises;Therapeutic activities;DME and/or AE instruction;Parrafin;Cryotherapy;Electrical Stimulation;Fluidtherapy;Cognitive remediation/compensation;Visual/perceptual remediation/compensation;Passive range of motion;Contrast Bath;Moist Heat   Plan issue HEP for RUE coordiantion/ strength.   Consulted and Agree with Plan of Care Patient          G-Codes - 2015-08-03 1616    Functional Assessment Tool Used 9 hole peg test RUE 34.50 with 2 drops, LUE 37.0, difficulty cutting items with RUE, decreased alternating attention, uable to perform trailmaking task   Functional Limitation Self care   Carrying, Moving and Handling Objects Current Status (Z6109) At least 20 percent but less than 40 percent impaired, limited or restricted   Carrying, Moving and Handling Objects Goal Status (U0454) At least 1 percent but less than 20 percent impaired, limited or restricted      Problem List Patient Active Problem List   Diagnosis Date Noted  . Cerebral infarction due to thrombosis of left middle  cerebral artery   . Stroke 06/19/2015  . Essential hypertension 06/19/2015  . Hypokalemia 06/19/2015  . Tobacco abuse 06/19/2015    Zeina Akkerman Aug 03, 2015, 4:32 PM Keene Breath, OTR/L [phone] Phone: [phone] 4:32 PM 08-03-2015 [facility] [address] [place], [place], [address] Phone: [phone]   Fax:  [phone]

## 2015-07-13 NOTE — Therapy (Signed)
[facility] [address] [place], [place], [address] Phone: [phone]   Fax:  [phone]  Physical Therapy Evaluation  Patient Details  Name: Catherine Floyd MRN: [id number] Date of Birth: Feb 15, 1951 Referring Provider:  Barbie Banner, MD  Encounter Date: 07/13/2015      PT End of Session - 07/13/15 1122    Visit Number 1   Number of Visits 17  eval + 16 visits   Date for PT Re-Evaluation 09/11/15   Authorization Type Medicare - G Codes every 10 vists   PT Start Time 0800   PT Stop Time 0843   PT Time Calculation (min) 43 min   Activity Tolerance Patient tolerated treatment well   Behavior During Therapy [facility] for tasks assessed/performed      Past Medical History  Diagnosis Date  . Hypertension   . Cigarette nicotine dependence     Past Surgical History  Procedure Laterality Date  . Abdominal hysterectomy    . Appendectomy    . Tubal ligation      There were no vitals filed for this visit.  Visit Diagnosis:  Abnormality of gait - Plan: PT plan of care cert/re-cert  Hemiparesis, right - Plan: PT plan of care cert/re-cert  Impaired sensation - Plan: PT plan of care cert/re-cert  Unsteadiness - Plan: PT plan of care cert/re-cert  Dizziness and giddiness - Plan: PT plan of care cert/re-cert      Subjective Assessment - 07/13/15 0806    Subjective Pt sustained CVA on 06/19/15. "When I woke up on the 15th, I was having problems walking so I called my doctor. They told me to go straight to the hospital. At the hospital, I was admitted." Since sustaining CVA, pt reports that "things just take me longer. I withdraw from people because I get emotional." Since CVA, pt perceives balance impairments. Pt now has walker but doesn't use it. She has fallen 2x since CVA (once when feeding dog, once when pt tripped over sheets in floor). Pt reports walking tolerance is limited due to feeling "very short-winded,"  per pt. Pt also reports feeling of "heaviness" in head when performing head turns.   Pertinent History Cerebral infarction due to thrombosis of left middle cerebral artery (06/19/15), hypkalemia, HTN, tobacco abuse   Limitations Walking   Patient Stated Goals "I want my balance back."   Currently in Pain? No/denies            [facility] PT Assessment - 07/13/15 0001    Assessment   Medical Diagnosis CVA   Onset Date/Surgical Date 06/19/15   Hand Dominance Right   Prior Therapy No   Precautions   Precautions Fall   Restrictions   Weight Bearing Restrictions No   Balance Screen   Has the patient fallen in the past 6 months Yes   How many times? 2   Has the patient had a decrease in activity level because of a fear of falling?  No   Is the patient reluctant to leave their home because of a fear of falling?  No   Home Environment   Living Environment Private residence   Living Arrangements Alone   Type of Home House   Home Access Stairs to enter   Entrance Stairs-Number of Steps 1  small threshold through front door   Entrance Stairs-Rails None   Home Layout One level   Home Equipment Walker - 2 wheels;Cane - single point;Walker - 4 wheels   Prior  Function   Level of Independence Independent;Independent with gait;Independent with transfers;Independent with community mobility without device;Independent with homemaking with ambulation   Vocation Full time employment   Event organiser   Leisure watch TV; crochet   Cognition   Overall Cognitive Status Impaired/Different from baseline  describes "mental fog"   Observation/Other Assessments   Other Surveys  Other Surveys   Activities of Balance Confidence Scale (ABC Scale)  46.3%   Sensation   Light Touch Impaired Detail   Light Touch Impaired Details Impaired RUE;Impaired RLE   Additional Comments Light touch diminished on RUE/RLE as compared with L side.   Coordination   Gross Motor Movements are Fluid  and Coordinated Yes   Heel Shin Test Grossly WFL and symmetrical   ROM / Strength   AROM / PROM / Strength Strength   Strength   Overall Strength Deficits   Overall Strength Comments LLE grossly WFL. R hip flexion/ABD 4/5, R knee flexion, ankle DF 4/5.   Transfers   Transfers Sit to Stand;Stand to Sit   Sit to Stand 5: Supervision   Sit to Stand Details (indicate cue type and reason) Initially required supervision without UE use   Five time sit to stand comments  11.31 sec without UE support   Stand to Sit 6: Modified independent (Device/Increase time)   Ambulation/Gait   Ambulation/Gait Yes   Ambulation/Gait Assistance 5: Supervision;4: Min guard   Ambulation/Gait Assistance Details Min guard to Min A required during FGA   Ambulation Distance (Feet) 400 Feet   Assistive device None   Gait Pattern Step-through pattern;Narrow base of support;Scissoring;Poor foot clearance - right  BOS narrows, RLE scissoring occurs with dual-tasking   Ambulation Surface Level;Indoor   Gait velocity 3.09 ft/sec   Stairs Yes   Stairs Assistance 4: Min guard   Stair Management Technique No rails;Alternating pattern;Forwards   Number of Stairs 4   Height of Stairs 6   Functional Gait  Assessment   Gait assessed  Yes   Gait Level Surface Walks 20 ft in less than 5.5 sec, no assistive devices, good speed, no evidence for imbalance, normal gait pattern, deviates no more than 6 in outside of the 12 in walkway width.   Change in Gait Speed Able to smoothly change walking speed without loss of balance or gait deviation. Deviate no more than 6 in outside of the 12 in walkway width.   Gait with Horizontal Head Turns Performs head turns smoothly with slight change in gait velocity (eg, minor disruption to smooth gait path), deviates 6-10 in outside 12 in walkway width, or uses an assistive device.   Gait with Vertical Head Turns Performs head turns with no change in gait. Deviates no more than 6 in outside 12 in  walkway width.   Gait and Pivot Turn Pivot turns safely in greater than 3 sec and stops with no loss of balance, or pivot turns safely within 3 sec and stops with mild imbalance, requires small steps to catch balance.   Step Over Obstacle Is able to step over one shoe box (4.5 in total height) without changing gait speed. No evidence of imbalance.   Gait with Narrow Base of Support Ambulates less than 4 steps heel to toe or cannot perform without assistance.  3 steps prior to LOB to L side   Gait with Eyes Closed Cannot walk 20 ft without assistance, severe gait deviations or imbalance, deviates greater than 15 in outside 12 in walkway width or will  not attempt task.  LOB due to BLE scissoring   Ambulating Backwards Cannot walk 20 ft without assistance, severe gait deviations or imbalance, deviates greater than 15 in outside 12 in walkway width or will not attempt task.  LOB due to BLE scissoring   Steps Cannot do safely.  performs w/o rails with reciprocal pattern, safety concerns   Total Score 15            Vestibular Assessment - 07/13/15 0001    Vestibular Assessment   General Observation decreased gait stability with head turns (horizontal > vertical). Pt reports feeling of "heaviness" of head with head turns   Symptom Behavior   Type of Dizziness "Funny feeling in head"  "heaviness" in head with functional head turns   Duration of Dizziness seconds   Aggravating Factors Turning head quickly   Relieving Factors Head stationary                       PT Education - 07/13/15 1121    Education provided Yes   Education Details PT eval findings, goals, and POC.   Person(s) Educated Patient   Methods Explanation   Comprehension Verbalized understanding          PT Short Term Goals - 07/13/15 1143    PT SHORT TERM GOAL #1   Title Pt will perform initial home exercise program with mod I using paper handout to indicate safe HEP compliance, to maximize functional  gains. Target date: 08/10/15.   PT SHORT TERM GOAL #2   Title Pt will increase self-selected gait speed from 3.09 ft/sec to 3.39 ft/sec to indicate increased efficiency of ambulation. Target date: 08/10/15.   PT SHORT TERM GOAL #3   Title Pt will increase FGA score 15/30 to 19/30 to indicate improved dynamic gait stability. Target date: 08/10/15.   PT SHORT TERM GOAL #4   Title Pt will independently negotiate standard ramp and curb step to indicate pt safety traversing community obstacles. Target date: 08/10/15.           PT Long Term Goals - 07/13/15 1156    PT LONG TERM GOAL #1   Title Pt will verbalize understanding of fall prevention strategies to decrease risk of falling in home environment. Target date: 09/07/15.   PT LONG TERM GOAL #2   Title Patient will increase FGA score from 15 to 23/30 to indicate decreased risk of falling.  Target date: 09/07/15.   PT LONG TERM GOAL #3   Title Pt will independently ambulate 1,000' over unlevel paved and grass surfaces with no overt LOB to indicate increased safety with community mobility. Target date: 09/07/15.   PT LONG TERM GOAL #4   Title Pt will verbalize understanding of fall prevention strategies to decrease risk of falling in home environment.  Target date: 09/07/15.   PT LONG TERM GOAL #5   Title Pt will verbalize understanding of CVA warning signs, pertinent risk factors to prevent future CVA. Target date: 09/07/15.   Additional Long Term Goals   Additional Long Term Goals Yes   PT LONG TERM GOAL #6   Title Pt will increase Activities-Specific Balance Confidence Scale score from 46.3% to >/= 57% to indicate increased balance confidence. Target date: 09/07/15.               Plan - 07/13/15 1131    Clinical Impression Statement Pt is a 64 y/o F referred to outpatient PT to adrdess functional impaitments associated with CVA  sustained on 06/19/15. Pt hospitalized from 8/15 - 06/22/15 before being discharged home. Prior to CVA, pt was  completely independent with all functional mobility, had no history of falls, and was working full-time. PT evaluation reveals the following impairments: R hemiparesis; impaired sensation in RUE/LE; FGA score indicative of fall risk; gait impairments, which are more pronounced in with dual-tasking and in the presence of external demands; disequilibrium with functional head turns; falls x2 since discharge home from hospital.    Pt will benefit from skilled therapeutic intervention in order to improve on the following deficits Abnormal gait;Decreased activity tolerance;Decreased balance;Decreased endurance;Decreased coordination;Decreased safety awareness;Dizziness;Impaired UE functional use;Impaired sensation;Decreased cognition;Decreased strength   Rehab Potential Excellent   PT Frequency 2x / week   PT Duration 8 weeks   PT Treatment/Interventions ADLs/Self Care Home Management;DME Instruction;Gait training;Stair training;Functional mobility training;Therapeutic activities;Therapeutic exercise;Balance training;Patient/family education;Cognitive remediation;Neuromuscular re-education;Manual techniques;Vestibular   PT Next Visit Plan Further assess disequilibrium with head turns; initiate HEP.  In future, provide fall prevention strategies and CVA education.   Consulted and Agree with Plan of Care Patient          G-Codes - 02-Aug-2015 1128    Functional Assessment Tool Used FGA 15/30   Functional Limitation Mobility: Walking and moving around   Mobility: Walking and Moving Around Current Status [phone]) At least 20 percent but less than 40 percent impaired, limited or restricted   Mobility: Walking and Moving Around Goal Status [phone]) At least 1 percent but less than 20 percent impaired, limited or restricted       Problem List Patient Active Problem List   Diagnosis Date Noted  . Cerebral infarction due to thrombosis of left middle cerebral artery   . Stroke 06/19/2015  . Essential  hypertension 06/19/2015  . Hypokalemia 06/19/2015  . Tobacco abuse 06/19/2015    Jorje Guild, PT, DPT [facility] [address] [place], [place], [address] Phone: [phone]   Fax:  [phone] Aug 02, 2015, 12:08 PM

## 2015-07-14 ENCOUNTER — Other Ambulatory Visit: Payer: BLUE CROSS/BLUE SHIELD | Admitting: *Deleted

## 2015-07-14 NOTE — Patient Outreach (Signed)
[facility]) Care Management  07/14/2015  Catherine Floyd 1951/08/01 [id number]   EMMI-stroke follow up call: Telephone call to patient; left message on voice mail requesting call back;.  Plan; Will follow up and complete assessments. Appointment placed on calendar. Colleen Can, RN BSN CCM Care Management Coordinator [facility] Care Management  [phone]

## 2015-07-17 ENCOUNTER — Other Ambulatory Visit: Payer: BLUE CROSS/BLUE SHIELD | Admitting: *Deleted

## 2015-07-17 ENCOUNTER — Emergency Department (HOSPITAL_COMMUNITY)
Admission: EM | Admit: 2015-07-17 | Discharge: 2015-07-17 | Disposition: A | Discharge status: Home / Self Care | Payer: BLUE CROSS/BLUE SHIELD | Attending: Emergency Medicine | Admitting: Emergency Medicine

## 2015-07-17 ENCOUNTER — Emergency Department (HOSPITAL_COMMUNITY): Payer: BLUE CROSS/BLUE SHIELD

## 2015-07-17 ENCOUNTER — Encounter (HOSPITAL_COMMUNITY): Payer: Self-pay | Admitting: *Deleted

## 2015-07-17 DIAGNOSIS — Z7982 Long term (current) use of aspirin: Secondary | ICD-10-CM | POA: Diagnosis not present

## 2015-07-17 DIAGNOSIS — Z79899 Other long term (current) drug therapy: Secondary | ICD-10-CM | POA: Insufficient documentation

## 2015-07-17 DIAGNOSIS — Z720 Tobacco use: Secondary | ICD-10-CM | POA: Diagnosis not present

## 2015-07-17 DIAGNOSIS — I10 Essential (primary) hypertension: Secondary | ICD-10-CM | POA: Diagnosis not present

## 2015-07-17 DIAGNOSIS — R0602 Shortness of breath: Secondary | ICD-10-CM | POA: Insufficient documentation

## 2015-07-17 DIAGNOSIS — R079 Chest pain, unspecified: Secondary | ICD-10-CM | POA: Diagnosis present

## 2015-07-17 DIAGNOSIS — Z880 Allergy status to penicillin: Secondary | ICD-10-CM | POA: Diagnosis not present

## 2015-07-17 DIAGNOSIS — R51 Headache: Secondary | ICD-10-CM | POA: Diagnosis not present

## 2015-07-17 LAB — CBC
HCT: 40.6 % (ref 36.0–46.0)
HCT: 40.5 % (ref 36.0–46.0)
Hemoglobin: 13.2 g/dL (ref 12.0–15.0)
Hemoglobin: 13 g/dL (ref 12.0–15.0)
MCH: 30.3 pg (ref 26.0–34.0)
MCH: 29.8 pg (ref 26.0–34.0)
MCHC: 32.5 g/dL (ref 30.0–36.0)
MCHC: 32.1 g/dL (ref 30.0–36.0)
MCV: 93.3 fL (ref 78.0–100.0)
MCV: 92.9 fL (ref 78.0–100.0)
Platelets: 302 10*3/uL (ref 150–400)
Platelets: 332 10*3/uL (ref 150–400)
RBC: 4.35 MIL/uL (ref 3.87–5.11)
RBC: 4.36 MIL/uL (ref 3.87–5.11)
RDW: 13.1 % (ref 11.5–15.5)
RDW: 13.1 % (ref 11.5–15.5)
WBC: 5.9 10*3/uL (ref 4.0–10.5)
WBC: 5.4 10*3/uL (ref 4.0–10.5)

## 2015-07-17 LAB — BASIC METABOLIC PANEL
Anion gap: 8 (ref 5–15)
BUN: 12 mg/dL (ref 6–20)
CO2: 32 mmol/L (ref 22–32)
Calcium: 10.1 mg/dL (ref 8.9–10.3)
Chloride: 102 mmol/L (ref 101–111)
Creatinine, Ser: 0.62 mg/dL (ref 0.44–1.00)
GFR calc Af Amer: >60 mL/min (ref >60)
GFR calc non Af Amer: >60 mL/min (ref >60)
Glucose, Bld: 92 mg/dL (ref 65–99)
Potassium: 3.8 mmol/L (ref 3.5–5.1)
Sodium: 142 mmol/L (ref 135–145)

## 2015-07-17 LAB — I-STAT TROPONIN, ED: Troponin i, poc: 0 ng/mL (ref 0.00–0.08)

## 2015-07-17 MED ORDER — HYDRALAZINE HCL 20 MG/ML IJ SOLN
10.0000 mg | Freq: Once | INTRAMUSCULAR | Status: AC
  Administered 2015-07-17: 10 mg via INTRAVENOUS
  Filled 2015-07-17: qty 1

## 2015-07-17 NOTE — ED Notes (Signed)
Patient is alert and orientedx4.  Patient was explained discharge instructions and they understood them with no questions.  The patient's daughter, Jarome Matin is taking the patient home.

## 2015-07-17 NOTE — ED Provider Notes (Signed)
CSN: [id number]     Arrival date & time 07/17/15  1235 History   First MD Initiated Contact with Patient 07/17/15 1748     Chief Complaint  Patient presents with  . Hypertension     (Consider location/radiation/quality/duration/timing/severity/associated sxs/prior Treatment) Patient is a 64 y.o. female presenting with hypertension.  Hypertension This is a chronic problem. The current episode started today. The problem occurs constantly. The problem has been unchanged. Associated symptoms include chest pain and headaches. Pertinent negatives include no abdominal pain, arthralgias, chills, congestion, coughing, fever, myalgias, nausea, rash, sore throat, visual change or vomiting. Nothing aggravates the symptoms. Treatments tried: home bp meds. The treatment provided no relief.    Past Medical History  Diagnosis Date  . Hypertension   . Cigarette nicotine dependence    Past Surgical History  Procedure Laterality Date  . Abdominal hysterectomy    . Appendectomy    . Tubal ligation     Family History  Problem Relation Age of Onset  . Stroke Father   . Bladder Cancer Mother   . High blood pressure Father   . Diabetes Son    Social History  Substance Use Topics  . Smoking status: Current Some Day Smoker -- 1.00 packs/day  . Smokeless tobacco: Current User  . Alcohol Use: No   OB History    No data available     Review of Systems  Constitutional: Negative for fever and chills.  HENT: Negative for congestion and sore throat.   Eyes: Negative for visual disturbance.  Respiratory: Positive for chest tightness and shortness of breath. Negative for cough and wheezing.   Cardiovascular: Positive for chest pain.  Gastrointestinal: Negative for nausea, vomiting, abdominal pain, diarrhea and constipation.  Genitourinary: Negative for dysuria, difficulty urinating and vaginal pain.  Musculoskeletal: Negative for myalgias and arthralgias.  Skin: Negative for rash.  Neurological:  Positive for headaches. Negative for syncope.  Psychiatric/Behavioral: Negative for behavioral problems.  All other systems reviewed and are negative.     Allergies  Penicillins and Sulfa antibiotics  Home Medications   Prior to Admission medications   Medication Sig Start Date End Date Taking? Authorizing Provider  amLODipine (NORVASC) 5 MG tablet Take 1 tablet (5 mg total) by mouth daily. 06/22/15  Yes Joseph Art, DO  aspirin 325 MG tablet Take 1 tablet (325 mg total) by mouth daily. 06/22/15  Yes Joseph Art, DO  atorvastatin (LIPITOR) 80 MG tablet Take 1 tablet (80 mg total) by mouth daily at 6 PM. 06/22/15  Yes Joseph Art, DO  doxazosin (CARDURA) 4 MG tablet Take 4 mg by mouth daily. 05/18/15  Yes Historical Provider, MD  hydrochlorothiazide (HYDRODIURIL) 12.5 MG tablet Take 12.5 mg by mouth daily. 05/18/15  Yes Historical Provider, MD  Multiple Vitamin (MULTIVITAMIN) tablet Take 1 tablet by mouth daily.   Yes Historical Provider, MD  nicotine (NICODERM CQ - DOSED IN MG/24 HOURS) 21 mg/24hr patch Place 1 patch (21 mg total) onto the skin daily. 06/22/15  Yes Jessica U Vann, DO   BP 210/111 mmHg  Pulse 70  Temp(Src) 97.7 F (36.5 C) (Oral)  Resp 16  Wt 142 lb (64.411 kg)  SpO2 100% Physical Exam  Constitutional: She is oriented to person, place, and time. She appears well-developed and well-nourished. No distress.  HENT:  Head: Normocephalic and atraumatic.  Eyes: EOM are normal.  Neck: Normal range of motion.  Cardiovascular: Normal rate, regular rhythm, normal heart sounds and intact distal pulses.  No murmur heard. Pulmonary/Chest: Effort normal and breath sounds normal. No respiratory distress. She has no wheezes.  Abdominal: Soft. There is no tenderness.  Musculoskeletal: She exhibits no edema.  Neurological: She is alert and oriented to person, place, and time.  Skin: She is not diaphoretic.  Psychiatric: She has a normal mood and affect. Her behavior is  normal.    ED Course  Procedures (including critical care time) Labs Review Labs Reviewed  CBC  CBC  BASIC METABOLIC PANEL  Rosezena Sensor, ED    Imaging Review Dg Chest 2 View  07/17/2015   CLINICAL DATA:  Initial encounter for hypertension started today with headache.  EXAM: CHEST  2 VIEW  COMPARISON:  06/19/2015.  FINDINGS: The heart size and mediastinal contours are within normal limits. Both lungs are clear. The visualized skeletal structures are unremarkable.  IMPRESSION: No active cardiopulmonary disease.   Electronically Signed   By: Kennith Center M.D.   On: 07/17/2015 18:46   I have personally reviewed and evaluated these images and lab results as part of my medical decision-making.   EKG Interpretation None      MDM   Final diagnoses:  Essential hypertension     Patient is a 64 year old female with a history of hypertension, recent stroke with change in her antihypertensive medicines that presents with elevated blood pressure. Patient states at 4 AM this morning she woke and had a slight headache and has subsequently developed left-sided chest tightness. Patient has had some shortness of breath associated with this. The headache has improved however the chest tightness is gotten worse. On arrival to the ED the patient is hypertensive but not in acute distress. Her lung sounds are clear. Patient has no focal neuro deficits. Given the patient's symptoms we will get screening labs, EKG, chest x-ray and give IV hydralazine. We will reassess. Patient's EKG is normal sinus rhythm without evidence of ischemic change or arrhythmia.  After IV antihypertensives patient's blood pressure came down to 150/70 patient feels better this time. Patient's follow up with her PCP. Patient's troponin was undetectable and labs reassuring. Patient's chest x-ray was unremarkable.  Beverely Risen, MD 07/17/15 4098  Arby Barrette, MD 07/19/15 1630

## 2015-07-17 NOTE — Discharge Instructions (Signed)

## 2015-07-17 NOTE — Patient Outreach (Signed)
[facility]) Care Management  07/17/2015  Catherine Floyd 1951-03-14 [id number]   EMMI-stroke follow up:   Telephone call to patient; left message on voice mail requesting call back.   Plan: will follow up. Colleen Can, RN BSN CCM Care Management Coordinator [facility] Care Management  [phone]

## 2015-07-17 NOTE — ED Notes (Signed)
Pt in c/o hypertension, states she was unable to get a BP reading on her machine at home so she went to the fire department, they told her it was 214/100, pt had a stroke in August and they changed her BP medication at that time, but until today her BP had been well controlled with changes. C/o headache and feeling light headed at times, no distress noted.

## 2015-07-18 ENCOUNTER — Ambulatory Visit: Payer: BLUE CROSS/BLUE SHIELD | Admitting: Speech Pathology

## 2015-07-18 ENCOUNTER — Ambulatory Visit: Payer: BLUE CROSS/BLUE SHIELD | Admitting: Occupational Therapy

## 2015-07-18 DIAGNOSIS — R269 Unspecified abnormalities of gait and mobility: Secondary | ICD-10-CM | POA: Diagnosis not present

## 2015-07-18 DIAGNOSIS — IMO0002 Reserved for concepts with insufficient information to code with codable children: Secondary | ICD-10-CM

## 2015-07-18 DIAGNOSIS — R278 Other lack of coordination: Secondary | ICD-10-CM

## 2015-07-18 DIAGNOSIS — G8191 Hemiplegia, unspecified affecting right dominant side: Secondary | ICD-10-CM

## 2015-07-18 DIAGNOSIS — R279 Unspecified lack of coordination: Principal | ICD-10-CM

## 2015-07-18 NOTE — Therapy (Signed)
[facility] [address] [place], [place], [address] Phone: [phone]   Fax:  [phone]  Speech Language Pathology Evaluation  Patient Details  Name: Catherine Floyd MRN: [id number] Date of Birth: 13-Jul-1951 Referring Provider:  [name], MD  Encounter Date: 07/18/2015      End of Session - 07/18/15 1457    SLP Start Time 1403   SLP Stop Time  1445   SLP Time Calculation (min) 42 min      Past Medical History  Diagnosis Date  . Hypertension   . Cigarette nicotine dependence     Past Surgical History  Procedure Laterality Date  . Abdominal hysterectomy    . Appendectomy    . Tubal ligation      There were no vitals filed for this visit.  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 No/denies            SLP Evaluation [facility] - 07/18/15 1408    SLP Visit Information   SLP Received On 07/18/15   Onset Date 06/19/2015   Medical Diagnosis CVA   General Information   Other Pertinent Information 64 y.o. female suffered CVA 06/19/15. She reports initially "I lost my keys, my phone, I was loosing everything"   Prior Functional Status   Cognitive/Linguistic Baseline Within functional limits   Type of Home House    Lives With Friend(s)   Available Support Friend(s)   Vocation On disability   Cognition   Overall Cognitive Status Impaired/Different from baseline   Area of Impairment Attention;Memory   Current Attention Level Selective   Attention Comments impaired alternating/ divided   Awareness Impaired                      ADULT SLP TREATMENT - 07/18/15 1457    Cognitive-Linquistic Treatment   Skilled Treatment Initiated training of compensations for reduced attention with  usual min to mod A.            SLP Education - 07/18/15 1458    Education provided Yes   Education Details ST goals, compensations for attention   Person(s) Educated Patient   Methods Explanation;Demonstration;Handout   Comprehension Verbalized understanding;Need further instruction          SLP Short Term Goals - 07/18/15 1453    SLP SHORT TERM GOAL #1   Title Pt will alternate attention between 2 cognitive linguistic activities with 85% on each and rare min A   Time 4   Period Weeks   Status New   SLP SHORT TERM GOAL #2   Title Pt will attend details in moderately complex cogntive linguistic tasks with occasional min A and 85% accuracy   Time 4   Period Weeks   Status New   SLP SHORT TERM GOAL #3   Title Pt will complete mildly complex executive function tasks (planning, organizing, analyzing) with 85% accuracy and rare min A   Time 4   Period Weeks   Status New          SLP Long Term Goals - 07/18/15 1500    SLP LONG TERM GOAL #1   Title Pt will divide attention between 2 simple cognitive attention tasks with rare min A and 90%  on each task.   Time 8   Period Weeks   Status New   SLP LONG TERM GOAL #2   Title Pt will complete moderately coomplex executive function tasks with 90% accuracy and rare min A   Time 8   Period Weeks   Status New   SLP LONG TERM GOAL #3   Title Pt will verbalize 3 compensations for reduced attention with rare min A   Time 8   Period Weeks   Status New          Plan - 07/18/15 1440    Clinical Impression Statement 64 y.o. female who suffered a CVA on 06/19/2015. She presents today with high level cognitive linguistic deficits in alternating attention, divided attention, executive function and memory.  Pt left out details on check writing task. She required cues to fill in the date and sign check. Date was wrong. Pt reports she  uses her phone for orientation  but states she has had a hard time keeping up with the date. Simple  check book balancing demonstrated reduced attention to detail and poor organization of register, indicating reduced executive function skills.   Simple math finding correct balance of check book was not correct. Pt unaware of errors. Upon cueing pt to error, she required min A to correct math in checkbook.  I recommend skilled ST to maximize high level cognition for possible return to work .   Duration --  8 weeks   Treatment/Interventions Compensatory techniques;Internal/external aids;SLP instruction and feedback;Cognitive reorganization;Functional tasks;Patient/family education        Problem List Patient Active Problem List   Diagnosis Date Noted  . Cerebral infarction due to thrombosis of left middle cerebral artery   . Stroke 06/19/2015  . Essential hypertension 06/19/2015  . Hypokalemia 06/19/2015  . Tobacco abuse 06/19/2015    , Radene Journey MS, CCC-SLP 07/18/2015, 3:01 PM  [facility] [address] [place], [place], [address] Phone: [phone]   Fax:  [phone]

## 2015-07-18 NOTE — Patient Instructions (Signed)
  Attention/Concentration Tips  1. Break long tasks down into shorter parts  2. Limit distractions  3. Do harder tasks when you are most awake/alert  4. Quiet environment

## 2015-07-18 NOTE — Therapy (Signed)
[facility] [address] [place], [place], [address] Phone: [phone]   Fax:  [phone]  Occupational Therapy Treatment  Patient Details  Name: Catherine Floyd MRN: [id number] Date of Birth: [birth year] Referring Provider:  [name], MD  Encounter Date: 07/18/2015      OT End of Session - 07/18/15 1510    Visit Number 2   Number of Visits 17   Authorization Type BCBS/ Medicare   Authorization Time Period 60 days long term goals due 09/10/15   OT Start Time 1454   OT Stop Time 1533   OT Time Calculation (min) 39 min   Activity Tolerance Patient tolerated treatment well   Behavior During Therapy [facility] for tasks assessed/performed      Past Medical History  Diagnosis Date  . Hypertension   . Cigarette nicotine dependence     Past Surgical History  Procedure Laterality Date  . Abdominal hysterectomy    . Appendectomy    . Tubal ligation      There were no vitals filed for this visit.  Visit Diagnosis:  Decreased coordination  Hemiparesis, right      Subjective Assessment - 07/18/15 1630    Subjective  "I'm glad that I know what to work on at home"   Pertinent History see Epic   Currently in Pain? No/denies            [facility] OT Assessment - 07/18/15 1523    Hand Function   Right Hand Grip (lbs) 57   Left Hand Grip (lbs) 74                          OT Education - 07/18/15 1507    Education provided Yes   Education Details coordination, green putty HEP   Person(s) Educated Patient   Methods Explanation;Demonstration;Handout   Comprehension Verbalized understanding;Returned demonstration;Verbal cues required          OT Short Term Goals - 07/13/15 1621    OT SHORT TERM GOAL #1   Title I with HEP.   Time 4   Period Weeks   Status New   OT SHORT TERM GOAL #2   Title Pt will demonstrate ability to perform basic cooking task modified independently  demonstrating good safety awareness.   Time 4   Period Weeks   Status New   OT SHORT TERM GOAL #3   Title Pt will improve RUE fine motor coordiantion as evidenced by decreasing  9 hole peg test score to 30 secs without drops.   Time 4   Period Weeks   Status New           OT Long Term Goals - 07/13/15 1624    OT LONG TERM GOAL #1   Title Pt will demonstrate ability to perform mod complex cooking( 2 items simultaneously) modified independently demonstrating good safety awareness.   Baseline due 09/10/15   Time 8   Period Weeks   Status New   OT LONG TERM GOAL #2   Title Pt will perform simulated work activities at a modified independent level including placing items that ar 5 lbs on over head shelf with RUE without drops.   Time 8   Period Weeks   Status New   OT LONG TERM GOAL #3   Title Pt will demonstrate ability to carry 20 lbs, 25 ft x 3 without rest break.   Time 8   Period Weeks  Status New   OT LONG TERM GOAL #4   Title Pt will demonstrate improved visual perceptual skills as evidenced by copying a simple design with 95% or better accuracy.   Time 8   Period Weeks   Status New   OT LONG TERM GOAL #5   Title Pt will use RUE as her dominant hand at least 90% of the time for ADLs/ Functional activities.   Time 8   Period Weeks   Status New               Plan - 07/18/15 1526    Clinical Impression Statement Pt verbalized understanding of HEP and was able to return demo.  Pt with incr difficulty with in-hand manipulation, but improved with repetition.   Plan HEP for proximal UE strength (? theraband)   Consulted and Agree with Plan of Care Patient        Problem List Patient Active Problem List   Diagnosis Date Noted  . Cerebral infarction due to thrombosis of left middle cerebral artery   . Stroke 06/19/2015  . Essential hypertension 06/19/2015  . Hypokalemia 06/19/2015  . Tobacco abuse 06/19/2015    [facility] 07/18/2015, 4:32 PM  [facility] [address] [place], [place], [address] Phone: [phone]   Fax:  [phone]   Willa Frater, OTR/L 07/18/2015 4:32 PM

## 2015-07-18 NOTE — Patient Instructions (Addendum)
  Coordination Activities  Perform the following activities for 20 minutes 1-2 times per day with right hand(s).   Rotate ball in fingertips (clockwise and counter-clockwise).  Toss ball between hands.  Toss ball in air and catch with the same hand.  Flip cards 1 at a time as fast as you can on table.  Flip card between each finger  Deal cards with your thumb (Hold deck in hand and push card off top with thumb).  Rotate card in hand (clockwise and counter-clockwise).  Pick up coins, buttons, marbles, paperclips, beads, dried beans/pasta of different sizes and place in container.  Pick up coins and stack.  Pick up coins one at a time until you get 5-10 in your hand, then move coins from palm to fingertips to place in container 1 at a time.  Practice writing, tracing, coloring    Squeeze green putty with whole hand 20 times  Roll out red putty and then pinch with each finger and thumb down the roll.  Roll out 3 times.  Place beads, coins, buttons in putty and remove using just your R hand  Wrap putty around fingers/thumb like a ring/doughnut and then extend fingers.

## 2015-07-19 ENCOUNTER — Other Ambulatory Visit: Payer: Self-pay | Admitting: *Deleted

## 2015-07-19 NOTE — Patient Outreach (Signed)
[facility]) Care Management  07/19/2015  SHIZA THELEN November 28, 1950 [id number]   EMMI-Stroke follow up:  Telephone call to patient; left message on voice mail requesting return call.  Plan: ill follow up. Colleen Can, RN BSN CCM Care Management Coordinator [facility] Care Management  [phone]

## 2015-07-20 ENCOUNTER — Encounter: Payer: Self-pay | Admitting: Rehabilitation

## 2015-07-20 ENCOUNTER — Other Ambulatory Visit: Payer: Self-pay | Admitting: *Deleted

## 2015-07-20 ENCOUNTER — Encounter: Payer: Self-pay | Admitting: *Deleted

## 2015-07-20 ENCOUNTER — Ambulatory Visit: Payer: BLUE CROSS/BLUE SHIELD | Admitting: Rehabilitation

## 2015-07-20 ENCOUNTER — Ambulatory Visit: Payer: BLUE CROSS/BLUE SHIELD | Admitting: Occupational Therapy

## 2015-07-20 ENCOUNTER — Ambulatory Visit: Payer: BLUE CROSS/BLUE SHIELD | Admitting: Physical Therapy

## 2015-07-20 ENCOUNTER — Ambulatory Visit: Payer: BLUE CROSS/BLUE SHIELD | Admitting: Speech Pathology

## 2015-07-20 DIAGNOSIS — R2681 Unsteadiness on feet: Secondary | ICD-10-CM

## 2015-07-20 DIAGNOSIS — IMO0002 Reserved for concepts with insufficient information to code with codable children: Secondary | ICD-10-CM

## 2015-07-20 DIAGNOSIS — I69319 Unspecified symptoms and signs involving cognitive functions following cerebral infarction: Secondary | ICD-10-CM

## 2015-07-20 DIAGNOSIS — R279 Unspecified lack of coordination: Principal | ICD-10-CM

## 2015-07-20 DIAGNOSIS — R278 Other lack of coordination: Secondary | ICD-10-CM

## 2015-07-20 DIAGNOSIS — R269 Unspecified abnormalities of gait and mobility: Secondary | ICD-10-CM

## 2015-07-20 DIAGNOSIS — R42 Dizziness and giddiness: Secondary | ICD-10-CM

## 2015-07-20 DIAGNOSIS — R201 Hypoesthesia of skin: Secondary | ICD-10-CM

## 2015-07-20 NOTE — Patient Outreach (Signed)
[facility]) Care Management  07/20/2015  Catherine Floyd [birth year] [id number]  EMMI-Stroke call 2:   Subjective: Patient voices she has weakness on right side but it is better since she started outpatient rehabilitation. States she had to go to emergency room 09/12/ because blood pressure was high. States she was not admitted;but was treated and discharged. States currently she is taking blood pressure daily and it has ranged from 124/76 to 130/82 this week. States she is taking medication daily as prescribed by her doctor. State feeling good today and no problems with new stroke symptoms. States she has support from a friend as needed.  Objective:  Per EPIC review no hospital admission since recent discharge for stroke  06/22/2015. Was seen in emergency department with elevated blood pressure 07/17/2015. Per listed no change in medications or new prescriptions.  Assessment : Patient has good working knowledge of reportable stroke symptoms and action plan if symptoms occur.  Patient continues to have weakness to right side but states feels stronger since she started outpatient rehabilitation last week. Patient states she has attended all of scheduled session. Patient continues to be falls risk due to unilateral weakness. Has access to cane and walker to use as needed. Falls prevention strategies reviewed with patient. Will send educational material regarding fall prevention.  Plan:  Care plan updated as noted. Will follow up with patient next week and complete health assessments. Patient agrees to set appointment.    Colleen Can, RN BSN CCM Care Management Coordinator [facility] Care Management  [phone]

## 2015-07-20 NOTE — Patient Instructions (Signed)
  Strengthening: Resisted Flexion   Hold tubing with __right___ arm(s) at side. Pull forward and up. Move shoulder through pain-free range of motion. Repeat __10__ times per set.  Do _1-2_ sessions per day , every other day   Strengthening: Resisted Extension   Hold tubing in right hand(s), arm forward. Pull arm back, elbow straight. Repeat _10___ times per set. Do _1-2___ sessions per day, every other day.   Resisted Horizontal Abduction: Bilateral   Sit or stand, tubing in both hands, arms out in front. Keeping arms straight, pinch shoulder blades together and stretch arms out. Repeat _10___ times per set. Do _1-2___ sessions per day, every other day.   Elbow Flexion: Resisted   With tubing held in __right____ hand(s) and other end secured under foot, curl arm up as far as possible. Repeat _10___ times per set. Do _1-2___ sessions per day, every other day.

## 2015-07-20 NOTE — Patient Instructions (Signed)
Homework provided 

## 2015-07-20 NOTE — Therapy (Signed)
[facility] [address] [place], [place], [address] Phone: [phone]   Fax:  [phone]  Physical Therapy Treatment  Patient Details  Name: Catherine Floyd MRN: [id number] Date of Birth: October 07, 1951 Referring Provider:  [name], MD  Encounter Date: 07/20/2015      PT End of Session - 07/20/15 1645    Visit Number 2   Number of Visits 17   Date for PT Re-Evaluation 09/11/15   Authorization Type Medicare - G Codes every 10 vists   PT Start Time 1445   PT Stop Time 1530   PT Time Calculation (min) 45 min   Activity Tolerance Patient tolerated treatment well   Behavior During Therapy [facility] for tasks assessed/performed      Past Medical History  Diagnosis Date  . Hypertension   . Cigarette nicotine dependence     Past Surgical History  Procedure Laterality Date  . Abdominal hysterectomy    . Appendectomy    . Tubal ligation      There were no vitals filed for this visit.  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 No/denies                         [facility] Adult PT Treatment/Exercise - 07/20/15 1523    Transfers   Transfers Sit to Stand;Stand to Sit   Sit to Stand 6: Modified independent (Device/Increase time)   Five time sit to stand comments  --   Stand to Sit 6: Modified independent (Device/Increase time)   Ambulation/Gait   Ambulation/Gait Yes   Ambulation/Gait Assistance 5: Supervision;4: Min guard;4: Min assist   Ambulation/Gait Assistance Details min A with balance and  cognitive challenge, see NMR   Ambulation Distance (Feet) 400 Feet   Assistive device None   Gait Pattern Step-through pattern;Narrow base of support;Scissoring;Poor foot clearance - right  BOS narrows, RLE scissoring occurs with dual-tasking   Gait velocity --   Stairs --   Stairs Assistance --   Stair Management Technique --   Number of Stairs --   Height of Stairs --   Neuro Re-ed    Neuro Re-ed Details  Performed balance tasks in corner on compliant surface while performing head turns.  See pt instruction for further details.  Note increased challenge with increased speed of head turns.  Provided visual target to allow pt to safely complete at home.  Cues for keeping chair in front of her for safety at home.  Also added ball toss in corner while on foam.  Addressed high level balance and cognition with gait with ball toss>gait, ball toss and naming foods in alphabetical order to further challenge cognition.  Note multiple LOB with decreased stepping strategy and narrow BOS as well as some motor planning deficits when tossing ball.                    PT Education - 07/20/15 1644    Education provided Yes   Education Details HEP, see pt instruction for details.  Person(s) Educated Patient   Methods Explanation;Demonstration;Handout   Comprehension Verbalized understanding;Returned demonstration;Need further instruction          PT Short Term Goals - 07/13/15 1143    PT SHORT TERM GOAL #1   Title Pt will perform initial home exercise program with mod I using paper handout to indicate safe HEP compliance, to maximize functional gains. Target date: 08/10/15.   PT SHORT TERM GOAL #2   Title Pt will increase self-selected gait speed from 3.09 ft/sec to 3.39 ft/sec to indicate increased efficiency of ambulation. Target date: 08/10/15.   PT SHORT TERM GOAL #3   Title Pt will increase FGA score 15/30 to 19/30 to indicate improved dynamic gait stability. Target date: 08/10/15.   PT  SHORT TERM GOAL #4   Title Pt will independently negotiate standard ramp and curb step to indicate pt safety traversing community obstacles. Target date: 08/10/15.           PT Long Term Goals - 07/13/15 1156    PT LONG TERM GOAL #1   Title Pt will verbalize understanding of fall prevention strategies to decrease risk of falling in home environment. Target date: 09/07/15.   PT LONG TERM GOAL #2   Title Patient will increase FGA score from 15 to 23/30 to indicate decreased risk of falling.  Target date: 09/07/15.   PT LONG TERM GOAL #3   Title Pt will independently ambulate 1,000' over unlevel paved and grass surfaces with no overt LOB to indicate increased safety with community mobility. Target date: 09/07/15.   PT LONG TERM GOAL #4   Title Pt will verbalize understanding of fall prevention strategies to decrease risk of falling in home environment.  Target date: 09/07/15.   PT LONG TERM GOAL #5   Title Pt will verbalize understanding of CVA warning signs, pertinent risk factors to prevent future CVA. Target date: 09/07/15.   Additional Long Term Goals   Additional Long Term Goals Yes   PT LONG TERM GOAL #6   Title Pt will increase Activities-Specific Balance Confidence Scale score from 46.3% to >/= 57% to indicate increased balance confidence. Target date: 09/07/15.               Plan - 07/20/15 1646    Clinical Impression Statement Skilled session focused on initiation and providing HEP to address balance with vestibular challenges.  Also addressed high level balance and gait with addition of cognitive tasks. Pt tolerated well but with increased LOB and motor planning deficits noted.  Continue POC.    Pt will benefit from skilled therapeutic intervention in order to improve on the following deficits Abnormal gait;Decreased activity tolerance;Decreased balance;Decreased endurance;Decreased coordination;Decreased safety awareness;Dizziness;Impaired UE functional use;Impaired  sensation;Decreased cognition;Decreased strength   Rehab Potential Excellent   PT Frequency 2x / week   PT Duration 8 weeks   PT Treatment/Interventions ADLs/Self Care Home Management;DME Instruction;Gait training;Stair training;Functional mobility training;Therapeutic activities;Therapeutic exercise;Balance training;Patient/family education;Cognitive remediation;Neuromuscular re-education;Manual techniques;Vestibular   PT Next Visit Plan high level gait/cognition, gait with head turns, fall prevention and CVA education.    Consulted and Agree with Plan of Care Patient        Problem List Patient Active Problem List   Diagnosis Date Noted  . Cerebral infarction due to thrombosis of left middle cerebral artery   . Stroke 06/19/2015  . Essential hypertension 06/19/2015  . Hypokalemia 06/19/2015  . Tobacco abuse 06/19/2015   Harriet Butte, PT, MPT [facility] [address]  [place], [place], [address] Phone: [phone]   Fax:  [phone] 07/20/2015, 4:49 PM'

## 2015-07-20 NOTE — Therapy (Signed)
[facility] [address] [place], [place], [address] Phone: [phone]   Fax:  [phone]  Occupational Therapy Treatment  Patient Details  Name: Catherine Floyd MRN: [id number] Date of Birth: 10-Jun-1951 Referring Provider:  [name], MD  Encounter Date: 07/20/2015      OT End of Session - 07/20/15 1415    Visit Number 3   Number of Visits 17   Authorization Type BCBS/ Medicare   Authorization Time Period 60 days long term goals due 09/10/15   OT Start Time 1407   OT Stop Time 1445   OT Time Calculation (min) 38 min      Past Medical History  Diagnosis Date  . Hypertension   . Cigarette nicotine dependence     Past Surgical History  Procedure Laterality Date  . Abdominal hysterectomy    . Appendectomy    . Tubal ligation      There were no vitals filed for this visit.  Visit Diagnosis:  Decreased coordination  Cognitive deficits following cerebral infarction  Weakness due to cerebrovascular accident  Impaired sensation      Subjective Assessment - 07/20/15 1413    Pertinent History see Epic   Patient Stated Goals to use RUE   Currently in Pain? No/denies      Treatment: Pt was educated in red theraband exercises for shoulder flexion, extension, horizontal abduction and biceps curls, min-mod v.c. initially then pt returned demonstration. theraputic activities; dealing playing cards with RUE thumb, stacking and manipulating cards in hand with RUE, min v.c. Arm bike x 6 mins level 1 for conditioning. Pt reports concerns over letter from her employer about short term disability/ return to work, therapist recommended pt contact her HR representative.                          OT Short Term Goals - 07/13/15 1621    OT SHORT TERM GOAL #1   Title I with HEP.   Time 4   Period Weeks   Status New   OT SHORT TERM GOAL #2   Title Pt will demonstrate ability to  perform basic cooking task modified independently demonstrating good safety awareness.   Time 4   Period Weeks   Status New   OT SHORT TERM GOAL #3   Title Pt will improve RUE fine motor coordiantion as evidenced by decreasing  9 hole peg test score to 30 secs without drops.   Time 4   Period Weeks   Status New           OT Long Term Goals - 07/13/15 1624    OT LONG TERM GOAL #1   Title Pt will demonstrate ability to perform mod complex cooking( 2 items simultaneously) modified independently demonstrating good safety awareness.   Baseline due 09/10/15   Time 8   Period Weeks   Status New   OT LONG TERM GOAL #2   Title Pt will perform simulated work activities at a modified independent level including placing items that ar 5 lbs on over head shelf with RUE without drops.   Time 8   Period Weeks   Status New   OT LONG TERM GOAL #3   Title Pt will demonstrate ability to carry 20 lbs, 25 ft x 3 without rest break.   Time 8   Period Weeks   Status New   OT LONG TERM GOAL #4   Title Pt  will demonstrate improved visual perceptual skills as evidenced by copying a simple design with 95% or better accuracy.   Time 8   Period Weeks   Status New   OT LONG TERM GOAL #5   Title Pt will use RUE as her dominant hand at least 90% of the time for ADLs/ Functional activities.   Time 8   Period Weeks   Status New               Plan - 07/20/15 1441    Clinical Impression Statement Pt is progressing towards goals. she can benefit from reinforcemtn of theraband HEP.   Pt will benefit from skilled therapeutic intervention in order to improve on the following deficits (Retired) Decreased coordination;Decreased range of motion;Difficulty walking;Decreased endurance;Decreased safety awareness;Impaired sensation;Decreased knowledge of precautions;Decreased activity tolerance;Decreased balance;Decreased knowledge of use of DME;Impaired UE functional use;Impaired vision/preception;Impaired  perceived functional ability;Decreased strength;Decreased mobility;Decreased cognition   Rehab Potential Good   OT Frequency 2x / week   OT Duration 8 weeks   OT Treatment/Interventions Self-care/ADL training;Therapeutic exercise;Patient/family education;Functional Mobility Training;Balance training;Manual Therapy;Neuromuscular education;Ultrasound;Energy conservation;Therapeutic exercises;Therapeutic activities;DME and/or AE instruction;Parrafin;Cryotherapy;Electrical Stimulation;Fluidtherapy;Cognitive remediation/compensation;Visual/perceptual remediation/compensation;Passive range of motion;Contrast Bath;Moist Heat   Plan reinforce red band HEP.        Problem List Patient Active Problem List   Diagnosis Date Noted  . Cerebral infarction due to thrombosis of left middle cerebral artery   . Stroke 06/19/2015  . Essential hypertension 06/19/2015  . Hypokalemia 06/19/2015  . Tobacco abuse 06/19/2015    RINE,KATHRYN 07/20/2015, 5:05 PM Keene Breath, OTR/L [phone] Phone: [phone] 5:05 PM 09/15/2016[facility] [address] [place], [place], [address] Phone: [phone]   Fax:  [phone]

## 2015-07-20 NOTE — Therapy (Signed)
[facility] [address] [place], [place], [address] Phone: [phone]   Fax:  [phone]  Speech Language Pathology Treatment  Patient Details  Name: WAUNETTA RIGGLE MRN: [id number] Date of Birth: 10-30-1951 Referring Provider:  [name], MD  Encounter Date: 07/20/2015      End of Session - 07/20/15 1402    Visit Number 2   Number of Visits 16   Date for SLP Re-Evaluation 09/12/15   SLP Start Time 1317   SLP Stop Time  1403   SLP Time Calculation (min) 46 min   Activity Tolerance Patient tolerated treatment well      Past Medical History  Diagnosis Date  . Hypertension   . Cigarette nicotine dependence     Past Surgical History  Procedure Laterality Date  . Abdominal hysterectomy    . Appendectomy    . Tubal ligation      There were no vitals filed for this visit.  Visit Diagnosis: Cognitive deficits following cerebral infarction      Subjective Assessment - 07/20/15 1325    Subjective "I don't know what to do with this" RE: release for  work form from [employer]   Currently in Pain? No/denies               ADULT SLP TREATMENT - 07/20/15 1326    General Information   Behavior/Cognition Cooperative;Alert;Pleasant mood   Treatment Provided   Treatment provided Cognitive-Linquistic   Cognitive-Linquistic Treatment   Treatment focused on Cognition   Skilled Treatment Facilitated alternating attention sorting card piles with 3 different rules for each pile  with occasional  to usual min cues to attend to all 3 piles as well as cards in her hand and draw pile. Pt. also required verbal reminders regularly to recall the rules for each piles.Pt with 70% accuracy alternating attention.  Facilitated attention to detail  following written directions with occasional min A to ID directions missed, despite having her double check answers - after double checking and cues, 75% accuracy.     Assessment / Recommendations / Plan   Plan Continue with current plan of care   Progression Toward Goals   Progression toward goals Progressing toward goals            SLP Short Term Goals - 07/20/15 1401    SLP SHORT TERM GOAL #1   Title Pt will alternate attention between 2 cognitive linguistic activities with 85% on each and rare min A   Time 4   Period Weeks   Status On-going   SLP SHORT TERM GOAL #2   Title Pt will attend details in moderately complex cogntive linguistic tasks with occasional min A and 85% accuracy   Time 4   Period Weeks   Status On-going   SLP SHORT TERM GOAL #3   Title Pt will complete mildly complex executive function tasks (planning, organizing, analyzing) with 85% accuracy and rare min A   Time 4   Period Weeks   Status On-going          SLP Long Term Goals - 07/20/15 1401    SLP LONG TERM GOAL #1   Title Pt will divide attention between 2 simple cognitive attention tasks with rare min A and 90% on each task.   Time 8   Period Weeks   Status On-going   SLP LONG TERM GOAL #2   Title Pt will complete moderately coomplex executive function tasks with 90% accuracy and  rare min A   Time 8   Period Weeks   Status On-going   SLP LONG TERM GOAL #3   Title Pt will verbalize 3 compensations for reduced attention with rare min A   Time 8   Period Weeks   Status On-going          Plan - 07/20/15 1359    Clinical Impression Statement Pt required usual min to mod A for simple attention to detail and alternating attention tasks. Pt verbalized awareness of her attention difficulties due to confrontation of them during the activities. Recommend continue skilled ST to maximize attention for possible retrun to work.   Speech Therapy Frequency 2x / week   Duration --  8 weeks   Treatment/Interventions Compensatory techniques;Internal/external aids;SLP instruction and feedback;Cognitive reorganization;Functional tasks;Patient/family education    Consulted and Agree with Plan of Care Patient        Problem List Patient Active Problem List   Diagnosis Date Noted  . Cerebral infarction due to thrombosis of left middle cerebral artery   . Stroke 06/19/2015  . Essential hypertension 06/19/2015  . Hypokalemia 06/19/2015  . Tobacco abuse 06/19/2015    Bryker Fletchall, Radene Journey MS, CCC-SLP 07/20/2015, 2:07 PM  [facility] [address] [place], [place], [address] Phone: [phone]   Fax:  [phone]

## 2015-07-20 NOTE — Patient Instructions (Addendum)
Feet Apart (Compliant Surface) Head Motion - Eyes Open   With eyes open, standing on compliant surface: __either thick enough pillow or couch cushion______, feet shoulder width apart, move head slowly: side to side . Repeat _10___ times per session. Do _2___ sessions per day.  Make sure you do these in corner with chair in front of you for safety in case of LOB.    Copyright  VHI. All rights reserved.   Feet Together (Compliant Surface) Head Motion - Eyes Open   With eyes open, standing on compliant surface: __either on pillow or couch cushion______, feet together, move head slowly: up and down. Repeat __10__ times per session. Do _2___ sessions per day.  Copyright  VHI. All rights reserved.   Throwing / Catching / Bouncing Ball: Compliant Surface   On pillow with feet apart, stand in corner with chair in front of you and toss ball to yourself.  Make sure that your eyes follow the ball up then down with each toss.  Repeat _10___ times per session. Do __2__ sessions per day.  Copyright  VHI. All rights reserved.

## 2015-07-21 ENCOUNTER — Ambulatory Visit: Payer: BLUE CROSS/BLUE SHIELD | Admitting: Physical Therapy

## 2015-07-21 ENCOUNTER — Encounter: Payer: Self-pay | Admitting: *Deleted

## 2015-07-24 ENCOUNTER — Ambulatory Visit: Payer: BLUE CROSS/BLUE SHIELD | Admitting: Occupational Therapy

## 2015-07-24 ENCOUNTER — Ambulatory Visit: Payer: BLUE CROSS/BLUE SHIELD

## 2015-07-24 DIAGNOSIS — I69319 Unspecified symptoms and signs involving cognitive functions following cerebral infarction: Secondary | ICD-10-CM

## 2015-07-24 DIAGNOSIS — R279 Unspecified lack of coordination: Principal | ICD-10-CM

## 2015-07-24 DIAGNOSIS — R278 Other lack of coordination: Secondary | ICD-10-CM

## 2015-07-24 DIAGNOSIS — R41842 Visuospatial deficit: Secondary | ICD-10-CM

## 2015-07-24 DIAGNOSIS — R269 Unspecified abnormalities of gait and mobility: Secondary | ICD-10-CM | POA: Diagnosis not present

## 2015-07-24 DIAGNOSIS — G8191 Hemiplegia, unspecified affecting right dominant side: Secondary | ICD-10-CM

## 2015-07-24 NOTE — Therapy (Signed)
[facility] [address] [place], [place], [address] Phone: [phone]   Fax:  [phone]  Occupational Therapy Treatment  Patient Details  Name: Catherine Floyd MRN: [id number] Date of Birth: February 17, 1951 Referring Provider:  [name], [name], MD  Encounter Date: 07/24/2015      OT End of Session - 07/24/15 1030    Visit Number 4   Number of Visits 17   Authorization Type BCBS primary (20 visit limit),  Medicare secondary (g-code)   Authorization Time Period 60 days long term goals due 09/10/15   Authorization - Visit Number 4   Authorization - Number of Visits 10   OT Start Time 1021   OT Stop Time 1100   OT Time Calculation (min) 39 min   Activity Tolerance Patient tolerated treatment well   Behavior During Therapy WFL for tasks assessed/performed      Past Medical History  Diagnosis Date  . Hypertension   . Cigarette nicotine dependence     Past Surgical History  Procedure Laterality Date  . Abdominal hysterectomy    . Appendectomy    . Tubal ligation      There were no vitals filed for this visit.  Visit Diagnosis:  Decreased coordination  Hemiparesis, right  Visuospatial deficit      Subjective Assessment - 07/24/15 1022    Subjective  Pt reports doing band HEP at home   Pertinent History see Epic   Patient Stated Goals to use RUE   Currently in Pain? No/denies                      OT Treatments/Exercises (OP) - 07/24/15 0001    Fine Motor Coordination   Fine Motor Coordination Small Pegboard;Grooved pegs   Small Pegboard Placing small pegs in pegboard with R hand for incr coordination with min difficulty.  Pt needed min-mod cueing to copy design correctly and to use compensation strategies for scanning/attention when demonstrating difficulty.  Pt reports "just not seeing as well" when design placed on R side vs. midline.   Grooved pegs placing grooved pegs in  pegboard with min difficulty for incr in-hand manipulation.   Exercises   Reciprocal Movements Arm bike x <MEASUREME(256)R91/4CasimiroEric FormFe3631 3Marland Kitch45231590A9<MEASUREMENT306R91/4CasimiroEric FormFe8972-1Marland Kitch397 E(5598159A5<MEASUREMENT(380R91/4CasimiroEric FormFe3(778)6Marland Kitch7946317075A9<MEASUREMENT412R91/4CasimiroEric FormFe4346-7Marland Kitch5819851 SEA3<MEASUREMENT(725R91/4CasimiroEric FormFe72587-5Marland Kitch75509177 OldA3<MEASUREMENT272R91/4CasimiroEric FormFe74515-6Marland Ki6399060 A5<MEASUREMENT(320R91/4CasimiroEric FormFe62(952) 8Marland Kitch8353<MEASUREMENT614R91/4CasimiroEric FormFe23540-0Marland Kitch81482 A(5<MEASUREMENT972R91/4CasimiroEric FormFe9778-0Marland Kitch18543593A4<MEASUREMENT331R91/4CasimiroEric FormFe28984-3Marland Kit(817)A3<MEASUREMENT(805)R91/4CasimiroEric FormFe27(619) 6Marland Kitch733 S41684 W.A7<MEASUREMENT7R91/4CasimiroEric FormFe32(520)8Marland Kit(440)83A(33<MEASUREMENT(814R91/4CasimiroEric FormFe53(559)7Marland Kitch7665 S. Sh60390A4<MEASUREMENT(484)R91/4CasimiroEric FormFe84(805)3Marland Kitch79 N. (320115 A2<MEASUREMENT450R91/4CasimiroEric FormFe65986-2Marland Kitc5869404 A(2<MEASUREMENT959R91/4CasimiroEric FormFe27902-8Marland Kitch98229 A3<MEASUREMENT216R91/4CasimiroEric FormFe39619-7Marland Kit551651 N. SilvA2<MEASUREMENT(614R91/4CasimiroEric FormFe33234 8Marland Kitch612 (316A(98<MEASUREMENT(402R91/4CasimiroEric FormFe82404-7Marland Ki862794 LeA(3<MEASUREMENT732R91/4CasimiroEric FormFe28(601)0Marland Kitch568258 A7<MEASUREMENT(832)R91/4CasimiroEric FormFe85213-6Marland Kitc7565A(41<MEASUREMENT434R91/4CasimiroEric FormFe16401 6Marland Kitch916 W(91583A7<MEASUREMENT660R91/4CasimiroEric FormFe32(872)7Marland Kitch28 S713493A(26<MEASUREMENT5R91/4CasimiroEric FormFe77(704)8Marland Kitch6385090A4<MEASUREMENT(817R91/4CasimiroEric FormFe52(623) 8Marland KiA(5<MEASUREMENT(367R91/4CasimiroEric FormFe79360 2Marland Kitch9709 4189417 LA7<MEASUREMENT(612R91/4CasimiroEric FormFe4314 1Marland Kitch5 Co(7349189 W. A(2<MEASUREMENT812R91/4CasimiroEric FormFe65647-6Marland Kitc(847A(808)[address]) level 1 for reciprocal movement/conditioning.   Functional Reaching Activities   High Level functional reaching to remove/replace clothespins with 1-8lb resistance for incr strength/activity tolerance in standing without rest break.                OT Education - 07/24/15 1026    Education Details Reviewed red theraband HEP   Person(s) Educated Patient   Methods Explanation;Verbal cues   Comprehension Verbalized understanding;Returned demonstration          OT Short Term Goals - 07/13/15 1621    OT SHORT TERM GOAL #1   Title I with HEP.   Time 4   Period Weeks   Status New   OT SHORT TERM GOAL #2   Title Pt will demonstrate ability to perform basic cooking task modified independently demonstrating good safety awareness.   Time 4   Period Weeks   Status New   OT SHORT TERM GOAL #3   Title Pt will improve RUE fine motor coordiantion as evidenced by decreasing  9 hole peg test score to 30 secs without drops.   Time 4   Period Weeks   Status New           OT Long Term Goals -  07/13/15 1624    OT LONG TERM GOAL #1   Title Pt will demonstrate ability to perform mod complex cooking( 2 items simultaneously) modified independently demonstrating good safety awareness.   Baseline due 09/10/15   Time 8   Period Weeks   Status New   OT LONG TERM GOAL #2   Title Pt will perform simulated work activities at a modified independent level including placing items that ar 5 lbs on over head shelf with RUE without drops.   Time 8   Period Weeks   Status New   OT LONG TERM GOAL #3   Title Pt will demonstrate ability to carry 20 lbs, 25 ft x 3 without rest break.   Time 8   Period Weeks   Status New   OT LONG TERM GOAL #4   Title Pt will demonstrate improved visual perceptual skills as evidenced by copying a simple design with 95% or better  accuracy.   Time 8   Period Weeks   Status New   OT LONG TERM GOAL #5   Title Pt will use RUE as her dominant hand at least 90% of the time for ADLs/ Functional activities.   Time 8   Period Weeks   Status New               Plan - 07/24/15 1708    Clinical Impression Statement Pt progressing well with coordination and activity tolerance, but demo difficulty attention/visual scanning for copying peg design.   Plan simulated work tasks   Financial planner with Plan of Care Patient        Problem List Patient Active Problem List   Diagnosis Date Noted  . Cerebral infarction due to thrombosis of left middle cerebral artery   . Stroke 06/19/2015  . Essential hypertension 06/19/2015  . Hypokalemia 06/19/2015  . Tobacco abuse 06/19/2015    [facility] 07/24/2015, 5:12 PM  [facility] [address] [place], [place], [address] Phone: [phone]   Fax:  [phone]   Willa Frater, OTR/L 07/24/2015 5:12 PM

## 2015-07-24 NOTE — Therapy (Signed)
[facility] [address] [place], [place], [address] Phone: [phone]   Fax:  [phone]  Speech Language Pathology Treatment  Patient Details  Name: Catherine Floyd MRN: [id number] Date of Birth: Aug 05, 1951 Referring Provider:  [name], MD  Encounter Date: 07/24/2015      End of Session - 07/24/15 1254    Visit Number 3   Number of Visits 16   Date for SLP Re-Evaluation 09/12/15   SLP Start Time 0933   SLP Stop Time  1016   SLP Time Calculation (min) 43 min   Activity Tolerance --      Past Medical History  Diagnosis Date  . Hypertension   . Cigarette nicotine dependence     Past Surgical History  Procedure Laterality Date  . Abdominal hysterectomy    . Appendectomy    . Tubal ligation      There were no vitals filed for this visit.  Visit Diagnosis: Cognitive deficits following cerebral infarction      Subjective Assessment - 07/24/15 0942    Subjective "We matched shapes, and, tell me about the trees outside..." (pt, re: what Vernona Rieger has done with pt)               ADULT SLP TREATMENT - 07/24/15 0942    General Information   Behavior/Cognition Cooperative;Alert;Pleasant mood   Treatment Provided   Treatment provided Cognitive-Linquistic   Pain Assessment   Pain Assessment No/denies pain   Cognitive-Linquistic Treatment   Treatment focused on Cognition   Skilled Treatment Alternating attention tasks today, pt req'd cues to find missing word in category task. Ability to switch written tasks req'd rare min A.  Pt alternated between calendar and response sheet with extra time, occasionally. Pt's sustained/selective attention appeared to decr as session progressed.   Assessment / Recommendations / Plan   Plan Continue with current plan of care   Progression Toward Goals   Progression toward goals Progressing toward goals            SLP Short Term Goals - 07/24/15  1256    SLP SHORT TERM GOAL #1   Title Pt will alternate attention between 2 cognitive linguistic activities with 85% on each and rare min A   Time 3   Period Weeks   Status On-going   SLP SHORT TERM GOAL #2   Title Pt will attend details in moderately complex cogntive linguistic tasks with occasional min A and 85% accuracy   Time 3   Period Weeks   Status On-going   SLP SHORT TERM GOAL #3   Title Pt will complete mildly complex executive function tasks (planning, organizing, analyzing) with 85% accuracy and rare min A   Time 3   Period Weeks   Status On-going          SLP Long Term Goals - 07/24/15 1257    SLP LONG TERM GOAL #1   Title Pt will divide attention between 2 simple cognitive attention tasks with rare min A and 90% on each task.   Time 7   Period Weeks   Status On-going   SLP LONG TERM GOAL #2   Title Pt will complete moderately coomplex executive function tasks with 90% accuracy and rare min A   Time 7   Period Weeks   Status On-going   SLP LONG TERM GOAL #3   Title Pt will verbalize 3 compensations for reduced attention with rare min A   Time  7   Period Weeks   Status On-going          Plan - 07/24/15 1255    Clinical Impression Statement Pt's alternating attention cont to appear as deficit area, and sustained/selective attention decreased as session continued. Homework provided. Skilled ST remains needed to maximize cognitive linguistic skills.   Speech Therapy Frequency 2x / week   Duration --  7 weeks   Treatment/Interventions Compensatory techniques;Internal/external aids;SLP instruction and feedback;Cognitive reorganization;Functional tasks;Patient/family education   Potential to Achieve Goals Good   Consulted and Agree with Plan of Care Patient        Problem List Patient Active Problem List   Diagnosis Date Noted  . Cerebral infarction due to thrombosis of left middle cerebral artery   . Stroke 06/19/2015  . Essential hypertension  06/19/2015  . Hypokalemia 06/19/2015  . Tobacco abuse 06/19/2015    [facility] , MS, CCC-SLP  07/24/2015, 12:58 PM  [facility] [address] [place], [place], [address] Phone: [phone]   Fax:  [phone]

## 2015-07-26 ENCOUNTER — Ambulatory Visit: Payer: BLUE CROSS/BLUE SHIELD | Admitting: Physical Therapy

## 2015-07-26 ENCOUNTER — Ambulatory Visit: Payer: BLUE CROSS/BLUE SHIELD | Admitting: Occupational Therapy

## 2015-07-26 DIAGNOSIS — R41842 Visuospatial deficit: Secondary | ICD-10-CM

## 2015-07-26 DIAGNOSIS — R201 Hypoesthesia of skin: Secondary | ICD-10-CM

## 2015-07-26 DIAGNOSIS — I69319 Unspecified symptoms and signs involving cognitive functions following cerebral infarction: Secondary | ICD-10-CM

## 2015-07-26 DIAGNOSIS — R279 Unspecified lack of coordination: Secondary | ICD-10-CM

## 2015-07-26 DIAGNOSIS — IMO0002 Reserved for concepts with insufficient information to code with codable children: Secondary | ICD-10-CM

## 2015-07-26 DIAGNOSIS — R278 Other lack of coordination: Secondary | ICD-10-CM

## 2015-07-26 DIAGNOSIS — R269 Unspecified abnormalities of gait and mobility: Secondary | ICD-10-CM | POA: Diagnosis not present

## 2015-07-26 NOTE — Therapy (Signed)
[facility] [address] [place], [place], [address] Phone: [phone]   Fax:  [phone]  Occupational Therapy Treatment  Patient Details  Name: Catherine Floyd MRN: [id number] Date of Birth: 04-Apr-1951 Referring Provider:  [name], MD  Encounter Date: 07/26/2015      OT End of Session - 07/26/15 1052    Visit Number 5   Number of Visits 17   Authorization Type [employer] primary (20 visit limit),  Medicare secondary (g-code)   Authorization Time Period 60 days long term goals due 09/10/15   Authorization - Visit Number 5   Authorization - Number of Visits 10   Activity Tolerance Patient tolerated treatment well   Behavior During Therapy [facility] for tasks assessed/performed      Past Medical History  Diagnosis Date  . Hypertension   . Cigarette nicotine dependence     Past Surgical History  Procedure Laterality Date  . Abdominal hysterectomy    . Appendectomy    . Tubal ligation      There were no vitals filed for this visit.  Visit Diagnosis:  Weakness due to cerebrovascular accident  Impaired sensation  Cognitive deficits following cerebral infarction  Decreased coordination  Visuospatial deficit      Subjective Assessment - 07/26/15 1036    Subjective  Pt denies pain   Pertinent History see Epic   Patient Stated Goals to use RUE   Currently in Pain? No/denies      Treatment: functional overhead reaching to put lightweight objects in overhead cabinet with RUE. Arm bike x 8 mins level 5 for conditioning.  Pt reported she was not feeling well after this activity and that she had not eaten. Therapist provided pt with peanut butter crackers and water. Therapist monitored vitals, Pt's BP : 161/80, and HR was initially 108 BPM, but it improved to 78 BPM after several mins rest. Pt then reported she was upset as her mother had passed away last night. Therapist comforted patient and  ended  session. Pt.was  encouraged pt to go home and rest.                          OT Short Term Goals - 07/26/15 1038    OT SHORT TERM GOAL #1   Title I with HEP.   Baseline due 08/09/15   Time 4   Period Weeks   Status New   OT SHORT TERM GOAL #2   Title Pt will demonstrate ability to perform basic cooking task modified independently demonstrating good safety awareness.   Time 4   Period Weeks   Status New   OT SHORT TERM GOAL #3   Title Pt will improve RUE fine motor coordiantion as evidenced by decreasing  9 hole peg test score to 30 secs without drops.   Time 4   Period Weeks   Status New           OT Long Term Goals - 07/13/15 1624    OT LONG TERM GOAL #1   Title Pt will demonstrate ability to perform mod complex cooking( 2 items simultaneously) modified independently demonstrating good safety awareness.   Baseline due 09/10/15   Time 8   Period Weeks   Status New   OT LONG TERM GOAL #2   Title Pt will perform simulated work activities at a modified independent level including placing items that ar 5 lbs on over head shelf with RUE  without drops.   Time 8   Period Weeks   Status New   OT LONG TERM GOAL #3   Title Pt will demonstrate ability to carry 20 lbs, 25 ft x 3 without rest break.   Time 8   Period Weeks   Status New   OT LONG TERM GOAL #4   Title Pt will demonstrate improved visual perceptual skills as evidenced by copying a simple design with 95% or better accuracy.   Time 8   Period Weeks   Status New   OT LONG TERM GOAL #5   Title Pt will use RUE as her dominant hand at least 90% of the time for ADLs/ Functional activities.   Time 8   Period Weeks   Status New               Plan - 07/26/15 1738    Clinical Impression Statement Pt is progressing towards goals. Treatment was ended early as pt was upset/ not feeeling well since her mother died last night.   Pt will benefit from skilled therapeutic intervention in order to  improve on the following deficits (Retired) Decreased coordination;Decreased range of motion;Difficulty walking;Decreased endurance;Decreased safety awareness;Impaired sensation;Decreased knowledge of precautions;Decreased activity tolerance;Decreased balance;Decreased knowledge of use of DME;Impaired UE functional use;Impaired vision/preception;Impaired perceived functional ability;Decreased strength;Decreased mobility;Decreased cognition   Rehab Potential Good   OT Duration 8 weeks   OT Treatment/Interventions Self-care/ADL training;Therapeutic exercise;Patient/family education;Functional Mobility Training;Balance training;Manual Therapy;Neuromuscular education;Ultrasound;Energy conservation;Therapeutic exercises;Therapeutic activities;DME and/or AE instruction;Parrafin;Cryotherapy;Electrical Stimulation;Fluidtherapy;Cognitive remediation/compensation;Visual/perceptual remediation/compensation;Passive range of motion;Contrast Bath;Moist Heat   Plan simulated work activities   Consulted and Agree with Plan of Care Patient        Problem List Patient Active Problem List   Diagnosis Date Noted  . Cerebral infarction due to thrombosis of left middle cerebral artery   . Stroke 06/19/2015  . Essential hypertension 06/19/2015  . Hypokalemia 06/19/2015  . Tobacco abuse 06/19/2015    Wyonia Fontanella 07/26/2015, 5:41 PM Keene Breath, OTR/L [phone] Phone: [phone] 5:41 PM 07/26/2015 [facility] [address] [place], [place], [address] Phone: [phone]   Fax:  [phone]

## 2015-07-28 ENCOUNTER — Ambulatory Visit: Payer: BLUE CROSS/BLUE SHIELD | Admitting: Physical Therapy

## 2015-07-28 ENCOUNTER — Ambulatory Visit: Payer: BLUE CROSS/BLUE SHIELD

## 2015-07-31 ENCOUNTER — Ambulatory Visit: Payer: BLUE CROSS/BLUE SHIELD | Admitting: Rehabilitation

## 2015-07-31 ENCOUNTER — Encounter: Payer: BLUE CROSS/BLUE SHIELD | Admitting: Occupational Therapy

## 2015-08-02 ENCOUNTER — Ambulatory Visit: Payer: BLUE CROSS/BLUE SHIELD | Admitting: Occupational Therapy

## 2015-08-02 ENCOUNTER — Encounter: Payer: Self-pay | Admitting: Rehabilitation

## 2015-08-02 ENCOUNTER — Ambulatory Visit: Payer: BLUE CROSS/BLUE SHIELD

## 2015-08-02 ENCOUNTER — Ambulatory Visit: Payer: BLUE CROSS/BLUE SHIELD | Admitting: Rehabilitation

## 2015-08-02 DIAGNOSIS — IMO0002 Reserved for concepts with insufficient information to code with codable children: Secondary | ICD-10-CM

## 2015-08-02 DIAGNOSIS — R269 Unspecified abnormalities of gait and mobility: Secondary | ICD-10-CM | POA: Diagnosis not present

## 2015-08-02 DIAGNOSIS — R278 Other lack of coordination: Secondary | ICD-10-CM

## 2015-08-02 DIAGNOSIS — R279 Unspecified lack of coordination: Secondary | ICD-10-CM

## 2015-08-02 DIAGNOSIS — R42 Dizziness and giddiness: Secondary | ICD-10-CM

## 2015-08-02 DIAGNOSIS — R2681 Unsteadiness on feet: Secondary | ICD-10-CM

## 2015-08-02 DIAGNOSIS — R41842 Visuospatial deficit: Secondary | ICD-10-CM

## 2015-08-02 DIAGNOSIS — R201 Hypoesthesia of skin: Secondary | ICD-10-CM

## 2015-08-02 DIAGNOSIS — I69319 Unspecified symptoms and signs involving cognitive functions following cerebral infarction: Secondary | ICD-10-CM

## 2015-08-02 NOTE — Therapy (Signed)
[facility] [address] [place], [place], [address] Phone: [phone]   Fax:  [phone]  Speech Language Pathology Treatment  Patient Details  Name: Catherine Floyd MRN: [id number] Date of Birth: [birth year] Referring Provider:  [name], MD  Encounter Date: 08/02/2015      End of Session - 08/02/15 1014    Visit Number 4   Number of Visits 16   Date for SLP Re-Evaluation 09/12/15   SLP Start Time 0935   SLP Stop Time  1015   SLP Time Calculation (min) 40 min   Activity Tolerance Patient tolerated treatment well      Past Medical History  Diagnosis Date  . Hypertension   . Cigarette nicotine dependence     Past Surgical History  Procedure Laterality Date  . Abdominal hysterectomy    . Appendectomy    . Tubal ligation      There were no vitals filed for this visit.  Visit Diagnosis: Cerebrovascular accident with cognitive communication deficit      Subjective Assessment - 08/02/15 1013    Subjective "My mom died last 03-24-2023. I had to reschedule some things." Pt did not bring homework.               ADULT SLP TREATMENT - 08/02/15 0939    General Information   Behavior/Cognition Cooperative;Alert;Pleasant mood   Treatment Provided   Treatment provided Cognitive-Linquistic   Pain Assessment   Pain Assessment No/denies pain   Cognitive-Linquistic Treatment   Treatment focused on Cognition   Skilled Treatment Alternating attention tasks today with written and auditory stimuli. Pt req'd min cues rarely from SLP to keep focus. A checkbook task (mod difficult) req'd cues from SLP to complete correctly as pt required to alternate attention between figure and directions.   Assessment / Recommendations / Plan   Plan Continue with current plan of care   Progression Toward Goals   Progression toward goals Progressing toward goals            SLP Short Term Goals - 08/02/15 1015     SLP SHORT TERM GOAL #1   Title Pt will alternate attention between 2 cognitive linguistic activities with 85% on each and rare min A   Time 2   Period Weeks   Status On-going   SLP SHORT TERM GOAL #2   Title Pt will attend details in moderately complex cogntive linguistic tasks with occasional min A and 85% accuracy   Time 2   Period Weeks   Status On-going   SLP SHORT TERM GOAL #3   Title Pt will complete mildly complex executive function tasks (planning, organizing, analyzing) with 85% accuracy and rare min A   Time 2   Period Weeks   Status On-going          SLP Long Term Goals - 08/02/15 1015    SLP LONG TERM GOAL #1   Title Pt will divide attention between 2 simple cognitive attention tasks with rare min A and 90% on each task.   Time 6   Period Weeks   Status On-going   SLP LONG TERM GOAL #2   Title Pt will complete moderately coomplex executive function tasks with 90% accuracy and rare min A   Time 6   Period Weeks   Status On-going   SLP LONG TERM GOAL #3   Title Pt will verbalize 3 compensations for reduced attention with rare min A   Time 6  Period Weeks   Status On-going          Plan - 08/02/15 1014    Clinical Impression Statement Pt's alternating attention cont to appear as deficit area, and sustained/selective attention again seen as decreased as session continued. Homework provided. Skilled ST remains needed to maximize cognitive linguistic skills.   Speech Therapy Frequency 2x / week   Duration --  6 weeks   Treatment/Interventions Compensatory techniques;Internal/external aids;SLP instruction and feedback;Cognitive reorganization;Functional tasks;Patient/family education   Potential to Achieve Goals Good        Problem List Patient Active Problem List   Diagnosis Date Noted  . Cerebral infarction due to thrombosis of left middle cerebral artery   . Stroke 06/19/2015  . Essential hypertension 06/19/2015  . Hypokalemia 06/19/2015  .  Tobacco abuse 06/19/2015    [facility] , MS, CCC-SLP  08/02/2015, 10:16 AM  [facility] [address] [place], [place], [address] Phone: [phone]   Fax:  [phone]

## 2015-08-02 NOTE — Patient Instructions (Signed)

## 2015-08-02 NOTE — Therapy (Signed)
[facility] [address] [place], [place], [address] Phone: [phone]   Fax:  [phone]  Physical Therapy Treatment  Patient Details  Name: Catherine Floyd MRN: [id number] Date of Birth: 1951/03/24 Referring Provider:  [name], MD  Encounter Date: 08/02/2015      PT End of Session - 08/02/15 1245    Visit Number 3   Number of Visits 17   Date for PT Re-Evaluation 09/11/15   Authorization Type Medicare - G Codes every 10 vists   PT Start Time 1100   PT Stop Time 1145   PT Time Calculation (min) 45 min   Activity Tolerance Patient tolerated treatment well   Behavior During Therapy [facility] for tasks assessed/performed      Past Medical History  Diagnosis Date  . Hypertension   . Cigarette nicotine dependence     Past Surgical History  Procedure Laterality Date  . Abdominal hysterectomy    . Appendectomy    . Tubal ligation      There were no vitals filed for this visit.  Visit Diagnosis:  Abnormality of gait  Unsteadiness  Dizziness and giddiness      Subjective Assessment - 08/02/15 1101    Subjective Reports that balance is getting better, but still feels that head turns are somewhat difficulty and cause her to be "off."     Pertinent History Cerebral infarction due to thrombosis of left middle cerebral artery (06/19/15), hypkalemia, HTN, tobacco abuse   Limitations Walking   Patient Stated Goals "I want my balance back."   Currently in Pain? No/denies                         [facility] Adult PT Treatment/Exercise - 08/02/15 1142    Transfers   Transfers Sit to Stand;Stand to Sit   Sit to Stand 6: Modified independent (Device/Increase time)   Stand to Sit 6: Modified independent (Device/Increase time)   Ambulation/Gait   Ambulation/Gait Yes   Ambulation/Gait Assistance 5: Supervision   Ambulation/Gait Assistance Details S for safety, but no overt LOB   Ambulation  Distance (Feet) 350 Feet   Assistive device None   Gait Pattern Step-through pattern;Narrow base of support;Scissoring;Poor foot clearance - right   Ambulation Surface Level;Indoor   Self-Care   Self-Care Other Self-Care Comments   Other Self-Care Comments  See pt education for details   Neuro Re-ed    Neuro Re-ed Details  Performed high level balance with cognitive task on rocker board both vertically and horizontally with ball toss in varying directions progressing to addition of naming animals or girls names in alphabetical order alterating letters with rehab tech.  No overt LOB and good use of ankle and hip strategy.  Also performed several gait and balance activities involving head turns including; ball toss over head with focus on following ball with eyes/head and gait while scanning and naming card held by therapist.  Pt noted to veer to the R and L with task but no overt LOB during task and no increase in dizziness.                 PT Education - 08/02/15 1236    Education provided Yes   Education Details Provided handout and education on fall prevention strategies as well as warning signs and risk factors for CVA.   See pt instruction for fall prevention, provided physical copy of each.     Person(s) Educated  Patient   Methods Explanation;Verbal cues;Handout   Comprehension Verbalized understanding;Returned demonstration          PT Short Term Goals - 07/13/15 1143    PT SHORT TERM GOAL #1   Title Pt will perform initial home exercise program with mod I using paper handout to indicate safe HEP compliance, to maximize functional gains. Target date: 08/10/15.   PT SHORT TERM GOAL #2   Title Pt will increase self-selected gait speed from 3.09 ft/sec to 3.39 ft/sec to indicate increased efficiency of ambulation. Target date: 08/10/15.   PT SHORT TERM GOAL #3   Title Pt will increase FGA score 15/30 to 19/30 to indicate improved dynamic gait stability. Target date: 08/10/15.    PT SHORT TERM GOAL #4   Title Pt will independently negotiate standard ramp and curb step to indicate pt safety traversing community obstacles. Target date: 08/10/15.           PT Long Term Goals - 07/13/15 1156    PT LONG TERM GOAL #1   Title Pt will verbalize understanding of fall prevention strategies to decrease risk of falling in home environment. Target date: 09/07/15.   PT LONG TERM GOAL #2   Title Patient will increase FGA score from 15 to 23/30 to indicate decreased risk of falling.  Target date: 09/07/15.   PT LONG TERM GOAL #3   Title Pt will independently ambulate 1,000' over unlevel paved and grass surfaces with no overt LOB to indicate increased safety with community mobility. Target date: 09/07/15.   PT LONG TERM GOAL #4   Title Pt will verbalize understanding of fall prevention strategies to decrease risk of falling in home environment.  Target date: 09/07/15.   PT LONG TERM GOAL #5   Title Pt will verbalize understanding of CVA warning signs, pertinent risk factors to prevent future CVA. Target date: 09/07/15.   Additional Long Term Goals   Additional Long Term Goals Yes   PT LONG TERM GOAL #6   Title Pt will increase Activities-Specific Balance Confidence Scale score from 46.3% to >/= 57% to indicate increased balance confidence. Target date: 09/07/15.               Plan - 08/02/15 1245    Clinical Impression Statement Skilled session focused on education on fall prevention strategies and CVA warning signs and risk factors.  Pt with multiple quesitons, therefore provided education to answer questions.  Also continue to focus on high level balance with head turns and addition of cognitive tasks as able.  Continue POC.    Pt will benefit from skilled therapeutic intervention in order to improve on the following deficits Abnormal gait;Decreased activity tolerance;Decreased balance;Decreased endurance;Decreased coordination;Decreased safety awareness;Dizziness;Impaired UE  functional use;Impaired sensation;Decreased cognition;Decreased strength   Rehab Potential Excellent   PT Frequency 2x / week   PT Duration 8 weeks   PT Treatment/Interventions ADLs/Self Care Home Management;DME Instruction;Gait training;Stair training;Functional mobility training;Therapeutic activities;Therapeutic exercise;Balance training;Patient/family education;Cognitive remediation;Neuromuscular re-education;Manual techniques;Vestibular   PT Next Visit Plan high level gait/cognition, gait with head turns, gait outdoors   Consulted and Agree with Plan of Care Patient        Problem List Patient Active Problem List   Diagnosis Date Noted  . Cerebral infarction due to thrombosis of left middle cerebral artery   . Stroke 06/19/2015  . Essential hypertension 06/19/2015  . Hypokalemia 06/19/2015  . Tobacco abuse 06/19/2015    Harriet Butte, PT, MPT [facility] [address] [place], [place], [address] Phone: [phone]   Fax:  [phone] 08/02/2015, 12:57 PM

## 2015-08-02 NOTE — Patient Instructions (Signed)
  Please complete the assigned speech therapy homework and return it to your next session.  

## 2015-08-02 NOTE — Therapy (Signed)
[facility] [address] [place], [place], [address] Phone: [phone]   Fax:  [phone]  Occupational Therapy Treatment  Patient Details  Name: Catherine Floyd MRN: [id number] Date of Birth: [birth year] Referring Provider:  [name], MD  Encounter Date: 08/02/2015      OT End of Session - 08/02/15 1050    Visit Number 6   Number of Visits 17   Date for OT Re-Evaluation 09/10/15   Authorization Type BCBS primary (20 visit limit),  Medicare secondary (g-code)   Authorization Time Period 60 days long term goals due 09/10/15   Authorization - Visit Number 6   Authorization - Number of Visits 10   OT Start Time 1020   OT Stop Time 1100   OT Time Calculation (min) 40 min   Activity Tolerance Patient tolerated treatment well   Behavior During Therapy [facility] for tasks assessed/performed      Past Medical History  Diagnosis Date  . Hypertension   . Cigarette nicotine dependence     Past Surgical History  Procedure Laterality Date  . Abdominal hysterectomy    . Appendectomy    . Tubal ligation      There were no vitals filed for this visit.  Visit Diagnosis:  Weakness due to cerebrovascular accident  Impaired sensation  Cognitive deficits following cerebral infarction  Decreased coordination  Visuospatial deficit      Subjective Assessment - 08/02/15 1614    Subjective  Pt reports ooking at home   Pertinent History see Epic   Patient Stated Goals to use RUE   Currently in Pain? No/denies        Treatment: cooking task to cook a scrambled egg and box noodle mix simultaneously. Pt demonstrated ability to cook egg, modified independently. She demonstrated min difficulty following box directions for noodle mix, since this was an unfamiliar task. Therapist cued pt to use a timer to keep up with the time for  Cooking noodle mix. While pt was waiting for mix to cook she performed fine motor  tasks with RUE, placing removing grooved pegs from pegboard and sustained pinch with graded clothes pins. Pt demonstrated good safety with cooking activities.                        OT Short Term Goals - 08/02/15 1045    OT SHORT TERM GOAL #1   Title I with HEP.   Baseline due 08/09/15   Time 4   Period Weeks   Status On-going   OT SHORT TERM GOAL #2   Title Pt will demonstrate ability to perform basic cooking task modified independently demonstrating good safety awareness.   Time 4   Period Weeks   Status Achieved   OT SHORT TERM GOAL #3   Title Pt will improve RUE fine motor coordiantion as evidenced by decreasing  9 hole peg test score to 30 secs without drops.   Time 4   Period Weeks   Status On-going           OT Long Term Goals - 08/02/15 1024    OT LONG TERM GOAL #1   Title Pt will demonstrate ability to perform mod complex cooking( 2 items simultaneously) modified independently demonstrating good safety awareness.   Status On-going   OT LONG TERM GOAL #2   Title Pt will perform simulated work activities at a modified independent level including placing items that ar 5 lbs on  over head shelf with RUE without drops.   Status On-going   OT LONG TERM GOAL #3   Title Pt will demonstrate ability to carry 20 lbs, 25 ft x 3 without rest break.   Status On-going   OT LONG TERM GOAL #4   Title Pt will demonstrate improved visual perceptual skills as evidenced by copying a simple design with 95% or better accuracy.   Status On-going   OT LONG TERM GOAL #5   Title Pt will use RUE as her dominant hand at least 90% of the time for ADLs/ Functional activities.   Status On-going               Plan - 08/02/15 1047    Clinical Impression Statement Pt is progressing towards goals. She demonstrates good safety with cooking activities.   Pt will benefit from skilled therapeutic intervention in order to improve on the following deficits (Retired) Decreased  coordination;Decreased range of motion;Difficulty walking;Decreased endurance;Decreased safety awareness;Impaired sensation;Decreased knowledge of precautions;Decreased activity tolerance;Decreased balance;Decreased knowledge of use of DME;Impaired UE functional use;Impaired vision/preception;Impaired perceived functional ability;Decreased strength;Decreased mobility;Decreased cognition   Rehab Potential Good   OT Frequency 2x / week   OT Duration 8 weeks   OT Treatment/Interventions Self-care/ADL training;Therapeutic exercise;Patient/family education;Functional Mobility Training;Balance training;Manual Therapy;Neuromuscular education;Ultrasound;Energy conservation;Therapeutic exercises;Therapeutic activities;DME and/or AE instruction;Parrafin;Cryotherapy;Electrical Stimulation;Fluidtherapy;Cognitive remediation/compensation;Visual/perceptual remediation/compensation;Passive range of motion;Contrast Bath;Moist Heat   Plan simulated work activities, check short term goals   OT Home Exercise Plan issued theraband   Consulted and Agree with Plan of Care Patient        Problem List Patient Active Problem List   Diagnosis Date Noted  . Cerebral infarction due to thrombosis of left middle cerebral artery   . Stroke 06/19/2015  . Essential hypertension 06/19/2015  . Hypokalemia 06/19/2015  . Tobacco abuse 06/19/2015    Catherine Floyd 08/02/2015, 4:14 PM Keene Breath, OTR/L [phone] Phone: [phone] 4:14 PM 08/02/2015 [facility] [address] [place], [place], [address] Phone: [phone]   Fax:  [phone]

## 2015-08-04 ENCOUNTER — Other Ambulatory Visit: Payer: Self-pay | Admitting: *Deleted

## 2015-08-04 NOTE — Patient Outreach (Signed)
[facility]) Care Management  08/04/2015  Catherine Floyd 1951/04/06 [id number]  EMMI-stroke follow up telephone call; left message on voice mail requesting return call.  Plan: will follow up Colleen Can, RN BSN CCM Care Management Coordinator [facility] Care Management  [phone]

## 2015-08-07 ENCOUNTER — Other Ambulatory Visit: Payer: BLUE CROSS/BLUE SHIELD | Admitting: *Deleted

## 2015-08-07 NOTE — Patient Outreach (Signed)
[facility]) Care Management  08/07/2015  Catherine Floyd Oct 30, 1951 [id number]   EMMI-stroke follow up call.  Subjective: Patient states she is feeling well. Has not had any new symptoms of stroke. No admissions to hospital or emergency room visits since our last call. States she continues to go to outpatient rehabilitation. States balance is still off some and working on maintaining focus with her therapists. States is talking to her children about advanced directives and her desires.   Objective.  See medication list as noted. No inpatient readmission noted in EPIC since previous admission for stroke.  Assessment:  No falls in last month. Following falls prevention strategies. Needs Advanced Directive packet.  Plan: Will update care plan as noted. Will send Advance Directive packet. Will follow up next week. Patient in agreement with set appointment.  Colleen Can, RN BSN CCM Care Management Coordinator [facility] Care Management  [phone]

## 2015-08-08 ENCOUNTER — Ambulatory Visit: Payer: BLUE CROSS/BLUE SHIELD | Admitting: Occupational Therapy

## 2015-08-08 ENCOUNTER — Ambulatory Visit: Payer: BLUE CROSS/BLUE SHIELD | Attending: Internal Medicine | Admitting: Physical Therapy

## 2015-08-08 ENCOUNTER — Ambulatory Visit: Payer: BLUE CROSS/BLUE SHIELD | Admitting: Speech Pathology

## 2015-08-08 DIAGNOSIS — I69319 Unspecified symptoms and signs involving cognitive functions following cerebral infarction: Secondary | ICD-10-CM

## 2015-08-08 DIAGNOSIS — R2681 Unsteadiness on feet: Secondary | ICD-10-CM | POA: Diagnosis present

## 2015-08-08 DIAGNOSIS — R531 Weakness: Secondary | ICD-10-CM | POA: Insufficient documentation

## 2015-08-08 DIAGNOSIS — R42 Dizziness and giddiness: Secondary | ICD-10-CM | POA: Insufficient documentation

## 2015-08-08 DIAGNOSIS — R269 Unspecified abnormalities of gait and mobility: Secondary | ICD-10-CM

## 2015-08-08 DIAGNOSIS — R41841 Cognitive communication deficit: Secondary | ICD-10-CM | POA: Insufficient documentation

## 2015-08-08 DIAGNOSIS — R41842 Visuospatial deficit: Secondary | ICD-10-CM | POA: Insufficient documentation

## 2015-08-08 DIAGNOSIS — G8191 Hemiplegia, unspecified affecting right dominant side: Secondary | ICD-10-CM | POA: Insufficient documentation

## 2015-08-08 DIAGNOSIS — I69898 Other sequelae of other cerebrovascular disease: Secondary | ICD-10-CM | POA: Insufficient documentation

## 2015-08-08 DIAGNOSIS — R279 Unspecified lack of coordination: Secondary | ICD-10-CM | POA: Insufficient documentation

## 2015-08-08 DIAGNOSIS — R201 Hypoesthesia of skin: Secondary | ICD-10-CM

## 2015-08-08 DIAGNOSIS — I679 Cerebrovascular disease, unspecified: Secondary | ICD-10-CM | POA: Insufficient documentation

## 2015-08-08 DIAGNOSIS — R278 Other lack of coordination: Secondary | ICD-10-CM

## 2015-08-08 DIAGNOSIS — IMO0002 Reserved for concepts with insufficient information to code with codable children: Secondary | ICD-10-CM

## 2015-08-08 NOTE — Patient Instructions (Signed)
Homework - budget for [employer]

## 2015-08-08 NOTE — Therapy (Signed)
[facility] [address] [place], [place], [address] Phone: [phone]   Fax:  [phone]  Physical Therapy Treatment  Patient Details  Name: Catherine Floyd MRN: [id number] Date of Birth: 04-14-1951 Referring Provider:  Stephens Shire, MD  Encounter Date: 08/08/2015      PT End of Session - 08/08/15 1036    Visit Number 4   Number of Visits 17   Date for PT Re-Evaluation 09/11/15   Authorization Type Medicare - G Codes every 10 vists   PT Start Time 0932   PT Stop Time 1015   PT Time Calculation (min) 43 min   Activity Tolerance Patient tolerated treatment well   Behavior During Therapy [facility] for tasks assessed/performed      Past Medical History  Diagnosis Date  . Hypertension   . Cigarette nicotine dependence     Past Surgical History  Procedure Laterality Date  . Abdominal hysterectomy    . Appendectomy    . Tubal ligation      There were no vitals filed for this visit.  Visit Diagnosis:  Abnormality of gait  Unsteadiness      Subjective Assessment - 08/08/15 0935    Subjective Pt states, "Every day I feel like I'm getting better." Denies falls. Reports that "moving too fast" or "doing something suddenly." Pt reporting increased "pressure and stiffness" in mid/lower cervical spine which occurs at night time and limits sleep. MD has prescribed  melatonin, which has helped somewhat.   Pertinent History Cerebral infarction due to thrombosis of left middle cerebral artery (06/19/15), hypkalemia, HTN, tobacco abuse   Limitations Walking   Patient Stated Goals "I want my balance back."   Currently in Pain? No/denies  But reports discomfort/stiffness in neck in evening            [facility] PT Assessment - 08/08/15 0001    Palpation   Palpation comment With palpation of suboccipital musculature, noted point tenderness and increased soft tissue restriction at suboccipital musculature (on R > L).   concordant discomfort/stiffness reproduced   Functional Gait  Assessment   Gait assessed  Yes   Gait Level Surface Walks 20 ft in less than 5.5 sec, no assistive devices, good speed, no evidence for imbalance, normal gait pattern, deviates no more than 6 in outside of the 12 in walkway width.   Change in Gait Speed Able to smoothly change walking speed without loss of balance or gait deviation. Deviate no more than 6 in outside of the 12 in walkway width.   Gait with Horizontal Head Turns Performs head turns smoothly with slight change in gait velocity (eg, minor disruption to smooth gait path), deviates 6-10 in outside 12 in walkway width, or uses an assistive device.   Gait with Vertical Head Turns Performs head turns with no change in gait. Deviates no more than 6 in outside 12 in walkway width.   Gait and Pivot Turn Pivot turns safely in greater than 3 sec and stops with no loss of balance, or pivot turns safely within 3 sec and stops with mild imbalance, requires small steps to catch balance.   Step Over Obstacle Is able to step over one shoe box (4.5 in total height) without changing gait speed. No evidence of imbalance.   Gait with Narrow Base of Support Ambulates less than 4 steps heel to toe or cannot perform without assistance.   Gait with Eyes Closed Cannot walk 20 ft without assistance, severe gait  deviations or imbalance, deviates greater than 15 in outside 12 in walkway width or will not attempt task.   Ambulating Backwards Walks 20 ft, uses assistive device, slower speed, mild gait deviations, deviates 6-10 in outside 12 in walkway width.   Steps Alternating feet, no rail.   Total Score 20                     [facility] Adult PT Treatment/Exercise - 08/08/15 0001    Ambulation/Gait   Ambulation/Gait Yes   Ambulation/Gait Assistance 5: Supervision;4: Min guard   Ambulation/Gait Assistance Details Pt required min guard, verbal cueing tto prevent pt from colliding with wall  on R side.   Ambulation Distance (Feet) 550 Feet  x250' outdoor; remainder indoor   Assistive device None   Gait Pattern Step-through pattern;Narrow base of support;Scissoring;Poor foot clearance - right   Ambulation Surface Level;Unlevel;Indoor;Paved   Gait velocity 3.07 ft/sec   Stairs Assistance 7: Independent   Stair Management Technique No rails;Alternating pattern;Forwards   Number of Stairs 4   Height of Stairs 6   Ramp 7: Independent   Curb 7: Independent   Pre-Gait Activities --   Gait Comments During gait, noted increased LE scissoring with horizontal head/body turning. When turning body (90 and 180-degree turns), provided cueing for use of compensatory strategy (turning eyes, then head, then body) for impaired VOR. Also introduced gaze fixation during functional head turns. Pt will likely require reinforcement.Completed FGA with score of 20/30. See outcome measure for detailed findings.    Exercises   Exercises Other Exercises   Other Exercises  With instruction, cueing from this PT, pt effectively performed supine chin tucks with towel roll at occiput 5 x10-second holds for increased extensiblity of suboccipital musculature. Added to HEP.                PT Education - 08/08/15 1022    Education provided Yes   Education Details HEP: chin tuck exercise to address neck discomfort. Discussed FGA findings, progress, fall risk.   Person(s) Educated Patient   Methods Explanation;Demonstration;Handout   Comprehension Verbalized understanding;Returned demonstration          PT Short Term Goals - 08/08/15 1038    PT SHORT TERM GOAL #1   Title Pt will perform initial home exercise program with mod I using paper handout to indicate safe HEP compliance, to maximize functional gains. Target date: 08/10/15.   Status On-going   PT SHORT TERM GOAL #2   Title Pt will increase self-selected gait speed from 3.09 ft/sec to 3.39 ft/sec to indicate increased efficiency of ambulation.  Target date: 08/10/15.   Baseline 10/4:  self-selected gait speed = 3.07 ft/sec   Status Not Met   PT SHORT TERM GOAL #3   Title Pt will increase FGA score 15/30 to 19/30 to indicate improved dynamic gait stability. Target date: 08/10/15.   Baseline 10/4: FGA score = 20/30   Status Achieved   PT SHORT TERM GOAL #4   Title Pt will independently negotiate standard ramp and curb step to indicate pt safety traversing community obstacles. Target date: 08/10/15.   Status Achieved           PT Long Term Goals - 07/13/15 1156    PT LONG TERM GOAL #1   Title Pt will verbalize understanding of fall prevention strategies to decrease risk of falling in home environment. Target date: 09/07/15.   PT LONG TERM GOAL #2   Title Patient will increase FGA  score from 15 to 23/30 to indicate decreased risk of falling.  Target date: 09/07/15.   PT LONG TERM GOAL #3   Title Pt will independently ambulate 1,000' over unlevel paved and grass surfaces with no overt LOB to indicate increased safety with community mobility. Target date: 09/07/15.   PT LONG TERM GOAL #4   Title Pt will verbalize understanding of fall prevention strategies to decrease risk of falling in home environment.  Target date: 09/07/15.   PT LONG TERM GOAL #5   Title Pt will verbalize understanding of CVA warning signs, pertinent risk factors to prevent future CVA. Target date: 09/07/15.   Additional Long Term Goals   Additional Long Term Goals Yes   PT LONG TERM GOAL #6   Title Pt will increase Activities-Specific Balance Confidence Scale score from 46.3% to >/= 57% to indicate increased balance confidence. Target date: 09/07/15.               Plan - 08/08/15 1039    Clinical Impression Statement STG for FGA met, as pt improved score from 15 to 20/30 since PT evaluation. STG for self-selected gait speed not met. Pt continues to exhibit decreased gait stability with horizontal head turns and when quickly turning body during gait. Pt also  required min guard, cueing to avoid collision with wall on R side while ambulating in narrow hallway. Pt reporting blurred vision in R visual field since CVA. Pt will continue ot benefit from skilled outpatient PT to maximize safety with functional mobility and decrease fall risk.   Pt will benefit from skilled therapeutic intervention in order to improve on the following deficits Abnormal gait;Decreased activity tolerance;Decreased balance;Decreased endurance;Decreased coordination;Decreased safety awareness;Dizziness;Impaired UE functional use;Impaired sensation;Decreased cognition;Decreased strength   Rehab Potential Excellent   PT Frequency 2x / week   PT Duration 8 weeks   PT Treatment/Interventions ADLs/Self Care Home Management;DME Instruction;Gait training;Stair training;Functional mobility training;Therapeutic activities;Therapeutic exercise;Balance training;Patient/family education;Cognitive remediation;Neuromuscular re-education;Manual techniques;Vestibular   PT Next Visit Plan Check final STG for HEP. Continue with high level gait/cognition, gait with head turns, gait outdoors.    Consulted and Agree with Plan of Care Patient        Problem List Patient Active Problem List   Diagnosis Date Noted  . Cerebral infarction due to thrombosis of left middle cerebral artery ([place])   . Stroke ([place]) 06/19/2015  . Essential hypertension 06/19/2015  . Hypokalemia 06/19/2015  . Tobacco abuse 06/19/2015    Billie Ruddy, PT, DPT [facility] [address] [place], [place], [address] Phone: [phone]   Fax:  [phone] 08/08/2015, 10:47 AM

## 2015-08-08 NOTE — Therapy (Signed)
[facility] [address] [place], [place], [address] Phone: [phone]   Fax:  [phone]  Occupational Therapy Treatment  Patient Details  Name: Catherine Floyd MRN: [id number] Date of Birth: 02/18/1951 Referring Provider:  [name], MD  Encounter Date: 08/08/2015      OT End of Session - 08/08/15 1649    Visit Number 7   Number of Visits 17   Date for OT Re-Evaluation 09/10/15   Authorization Type BCBS primary (20 visit limit),  Medicare secondary (g-code)   Authorization Time Period 60 days long term goals due 09/10/15   Authorization - Visit Number 8   OT Start Time 1105   OT Stop Time 1145   OT Time Calculation (min) 40 min   Activity Tolerance Patient tolerated treatment well   Behavior During Therapy [facility] for tasks assessed/performed      Past Medical History  Diagnosis Date  . Hypertension   . Cigarette nicotine dependence     Past Surgical History  Procedure Laterality Date  . Abdominal hysterectomy    . Appendectomy    . Tubal ligation      There were no vitals filed for this visit.  Visit Diagnosis:  Visuospatial deficit  Weakness due to cerebrovascular accident  Impaired sensation  Cognitive deficits following cerebral infarction  Decreased coordination      Subjective Assessment - 08/08/15 1108    Subjective  Pt denies pain   Pertinent History see Epic   Patient Stated Goals to use RUE   Currently in Pain? No/denies   Multiple Pain Sites No       Treatment: Trailmaking part A: 100%, Trailmaking Part B: Pt required min-mod v.c. And demonstrated increased difficulty.Copying clock, flower and house with only minimal difficulty. Pt demonstrates difficulty with alternating attention.(P.T.  Reported pt was bumping the wall on the right side when walking today and did not self correct.) Therapist discussed this  with pt. and that this could impact her safety with  driving.Therapist recommended that pt ride with someone instead of driving at this time for safety. Pt became very defensive and said she was doing fine with driving and it's not the same as walking. Environmental scanning task; Pt missed 4 items on right side and pt bumped the wall when walking down hallway on right side. Therapist reinforced her concerns regarding pt's ability to drive safely. Arm bike x 5 mins level 4 for conditioning. Therapist started checking short term goals.                         OT Short Term Goals - 08/08/15 1327    OT SHORT TERM GOAL #1   Title I with HEP.   Status On-going   OT SHORT TERM GOAL #2   Title Pt will demonstrate ability to perform basic cooking task modified independently demonstrating good safety awareness.   Status Achieved   OT SHORT TERM GOAL #3   Title Pt will improve RUE fine motor coordiantion as evidenced by decreasing  9 hole peg test score to 30 secs without drops.   Baseline 29.31 secs   Status Achieved           OT Long Term Goals - 08/02/15 1024    OT LONG TERM GOAL #1   Title Pt will demonstrate ability to perform mod complex cooking( 2 items simultaneously) modified independently demonstrating good safety awareness.   Status On-going   OT  LONG TERM GOAL #2   Title Pt will perform simulated work activities at a modified independent level including placing items that ar 5 lbs on over head shelf with RUE without drops.   Status On-going   OT LONG TERM GOAL #3   Title Pt will demonstrate ability to carry 20 lbs, 25 ft x 3 without rest break.   Status On-going   OT LONG TERM GOAL #4   Title Pt will demonstrate improved visual perceptual skills as evidenced by copying a simple design with 95% or better accuracy.   Status On-going   OT LONG TERM GOAL #5   Title Pt will use RUE as her dominant hand at least 90% of the time for ADLs/ Functional activities.   Status On-going               Plan - 08/08/15  1648    Clinical Impression Statement Pt is progressing towards goals however she remains limited by cognitive deficits and visual perceptual deficits.   Pt will benefit from skilled therapeutic intervention in order to improve on the following deficits (Retired) Decreased coordination;Decreased range of motion;Difficulty walking;Decreased endurance;Decreased safety awareness;Impaired sensation;Decreased knowledge of precautions;Decreased activity tolerance;Decreased balance;Decreased knowledge of use of DME;Impaired UE functional use;Impaired vision/preception;Impaired perceived functional ability;Decreased strength;Decreased mobility;Decreased cognition   Rehab Potential Good   OT Frequency 2x / week   OT Duration 8 weeks   OT Treatment/Interventions Self-care/ADL training;Therapeutic exercise;Patient/family education;Functional Mobility Training;Balance training;Manual Therapy;Neuromuscular education;Ultrasound;Energy conservation;Therapeutic exercises;Therapeutic activities;DME and/or AE instruction;Parrafin;Cryotherapy;Electrical Stimulation;Fluidtherapy;Cognitive remediation/compensation;Visual/perceptual remediation/compensation;Passive range of motion;Contrast Bath;Moist Heat   Plan reinforce HEP, simulated work activities, Buyer, retail   OT Home Exercise Plan issued theraband   Consulted and Agree with Plan of Care Patient        Problem List Patient Active Problem List   Diagnosis Date Noted  . 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 Stroke ([facility]) 06/19/2015  . Essential hypertension 06/19/2015  . Hypokalemia 06/19/2015  . Tobacco abuse 06/19/2015    [name] 08/08/2015, 4:51 PM Keene Breath, OTR/L [phone] Phone: [phone] 4:51 PM 08/08/2015 [facility] [address] [place], [place], [address] Phone: [phone]   Fax:  [phone]

## 2015-08-08 NOTE — Therapy (Signed)
[facility] [address] [place], [place], [address] Phone: [phone]   Fax:  [phone]  Speech Language Pathology Treatment  Patient Details  Name: Catherine Floyd MRN: [id number] Date of Birth: 08-24-1951 Referring Provider:  [name], MD  Encounter Date: 08/08/2015      End of Session - 08/08/15 1059    Visit Number 5   Number of Visits 16   Date for SLP Re-Evaluation 09/12/15   SLP Start Time 1017   SLP Stop Time  1100   SLP Time Calculation (min) 43 min   Activity Tolerance Patient tolerated treatment well      Past Medical History  Diagnosis Date  . Hypertension   . Cigarette nicotine dependence     Past Surgical History  Procedure Laterality Date  . Abdominal hysterectomy    . Appendectomy    . Tubal ligation      There were no vitals filed for this visit.  Visit Diagnosis: Cognitive deficits following cerebral infarction      Subjective Assessment - 08/08/15 1022    Subjective "Nothing really is hard for me"   Currently in Pain? No/denies               ADULT SLP TREATMENT - 08/08/15 1025    General Information   Behavior/Cognition Cooperative;Alert;Pleasant mood   Treatment Provided   Treatment provided Cognitive-Linquistic   Pain Assessment   Pain Assessment No/denies pain   Cognitive-Linquistic Treatment   Treatment focused on Cognition   Skilled Treatment Alternating attention between sorting cards into 4 piles, if designs on cards match, pt names item in category on card.Pt attended to card piles with rare min A, required extended time to name object from card. Executive function tasks planning trip to the beach with  occassional min A for itinerary. Occasional to usual questioning cues for packing list for 4 days at the beach.    Assessment / Recommendations / Plan   Plan Continue with current plan of care   Progression Toward Goals   Progression toward  goals Progressing toward goals          SLP Education - 08/08/15 1054    Education provided Yes   Education Details Attention compesation   Person(s) Educated Patient   Methods Explanation;Demonstration   Comprehension Verbalized understanding          SLP Short Term Goals - 08/08/15 1059    SLP SHORT TERM GOAL #1   Title Pt will alternate attention between 2 cognitive linguistic activities with 85% on each and rare min A   Time 1   Period Weeks   Status On-going   SLP SHORT TERM GOAL #2   Title Pt will attend details in moderately complex cogntive linguistic tasks with occasional min A and 85% accuracy   Time 1   Period Weeks   Status On-going   SLP SHORT TERM GOAL #3   Title Pt will complete mildly complex executive function tasks (planning, organizing, analyzing) with 85% accuracy and rare min A   Time 1   Period Weeks   Status On-going          SLP Long Term Goals - 08/08/15 1059    SLP LONG TERM GOAL #1   Title Pt will divide attention between 2 simple cognitive attention tasks with rare min A and 90% on each task.   Time 5   Period Weeks   Status On-going   SLP LONG TERM  GOAL #2   Title Pt will complete moderately coomplex executive function tasks with 90% accuracy and rare min A   Time 5   Period Weeks   Status On-going   SLP LONG TERM GOAL #3   Title Pt will verbalize 3 compensations for reduced attention with rare min A   Time 5   Period Weeks   Status On-going          Plan - 08/08/15 1056    Clinical Impression Statement Pt continues to require min cues for alternating attention and usual min cues for executive function tasks. Continue skilled ST to maximize cognitive linguistic skills.   Speech Therapy Frequency 2x / week   Treatment/Interventions Compensatory techniques;Internal/external aids;SLP instruction and feedback;Cognitive reorganization;Functional tasks;Patient/family education   Potential to Achieve Goals Good        Problem  List Patient Active Problem List   Diagnosis Date Noted  . 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 Stroke (HCC) 06/19/2015  . Essential hypertension 06/19/2015  . Hypokalemia 06/19/2015  . Tobacco abuse 06/19/2015    Avila Albritton, Radene Journey MS, CCC-SLP 08/08/2015, 11:00 AM  [facility] [address] [place], [place], [address] Phone: [phone]   Fax:  [phone]

## 2015-08-10 ENCOUNTER — Ambulatory Visit: Payer: BLUE CROSS/BLUE SHIELD | Admitting: Physical Therapy

## 2015-08-10 ENCOUNTER — Ambulatory Visit: Payer: BLUE CROSS/BLUE SHIELD

## 2015-08-10 ENCOUNTER — Ambulatory Visit: Payer: BLUE CROSS/BLUE SHIELD | Admitting: Occupational Therapy

## 2015-08-10 DIAGNOSIS — R41842 Visuospatial deficit: Secondary | ICD-10-CM

## 2015-08-10 DIAGNOSIS — R269 Unspecified abnormalities of gait and mobility: Secondary | ICD-10-CM | POA: Diagnosis not present

## 2015-08-10 DIAGNOSIS — I69319 Unspecified symptoms and signs involving cognitive functions following cerebral infarction: Secondary | ICD-10-CM

## 2015-08-10 DIAGNOSIS — G8191 Hemiplegia, unspecified affecting right dominant side: Secondary | ICD-10-CM

## 2015-08-10 DIAGNOSIS — R279 Unspecified lack of coordination: Secondary | ICD-10-CM

## 2015-08-10 DIAGNOSIS — IMO0002 Reserved for concepts with insufficient information to code with codable children: Secondary | ICD-10-CM

## 2015-08-10 DIAGNOSIS — R201 Hypoesthesia of skin: Secondary | ICD-10-CM

## 2015-08-10 DIAGNOSIS — R278 Other lack of coordination: Secondary | ICD-10-CM

## 2015-08-10 DIAGNOSIS — R2681 Unsteadiness on feet: Secondary | ICD-10-CM

## 2015-08-10 NOTE — Therapy (Signed)
[facility] [address] [place], [place], [address] Phone: [phone]   Fax:  [phone]  Speech Language Pathology Treatment  Patient Details  Name: Catherine Floyd MRN: [id number] Date of Birth: 1951/02/23 Referring Provider:  [name], MD  Encounter Date: 08/10/2015      End of Session - 08/10/15 1239    Visit Number 6   Number of Visits 16   Date for SLP Re-Evaluation 09/12/15   SLP Start Time 1103   SLP Stop Time  1145   SLP Time Calculation (min) 42 min   Activity Tolerance Patient tolerated treatment well      Past Medical History  Diagnosis Date  . Hypertension   . Cigarette nicotine dependence     Past Surgical History  Procedure Laterality Date  . Abdominal hysterectomy    . Appendectomy    . Tubal ligation      There were no vitals filed for this visit.  Visit Diagnosis: Cognitive deficits following cerebral infarction             ADULT SLP TREATMENT - 08/10/15 1109    General Information   Behavior/Cognition Cooperative;Alert;Pleasant mood   Treatment Provided   Treatment provided Cognitive-Linquistic   Pain Assessment   Pain Assessment No/denies pain   Cognitive-Linquistic Treatment   Treatment focused on Cognition   Skilled Treatment Divided attention with written and auditory - pt with alternating attention:divided attention with 75%:25% ratio. SLP explained to pt that divided attention is slightly worse than it was prior to CVA and she agreed.   Assessment / Recommendations / Plan   Plan Continue with current plan of care   Progression Toward Goals   Progression toward goals Progressing toward goals          SLP Education - 08/10/15 1238    Education provided Yes   Education Details explained divided attention vs alternating attention   Person(s) Educated Patient   Methods Explanation;Demonstration   Comprehension Verbalized understanding           SLP Short Term Goals - 08/08/15 1059    SLP SHORT TERM GOAL #1   Title Pt will alternate attention between 2 cognitive linguistic activities with 85% on each and rare min A   Time 1   Period Weeks   Status On-going   SLP SHORT TERM GOAL #2   Title Pt will attend details in moderately complex cogntive linguistic tasks with occasional min A and 85% accuracy   Time 1   Period Weeks   Status On-going   SLP SHORT TERM GOAL #3   Title Pt will complete mildly complex executive function tasks (planning, organizing, analyzing) with 85% accuracy and rare min A   Time 1   Period Weeks   Status On-going          SLP Long Term Goals - 08/08/15 1059    SLP LONG TERM GOAL #1   Title Pt will divide attention between 2 simple cognitive attention tasks with rare min A and 90% on each task.   Time 5   Period Weeks   Status On-going   SLP LONG TERM GOAL #2   Title Pt will complete moderately coomplex executive function tasks with 90% accuracy and rare min A   Time 5   Period Weeks   Status On-going   SLP LONG TERM GOAL #3   Title Pt will verbalize 3 compensations for reduced attention with rare min A  Time 5   Period Weeks   Status On-going          Plan - 08/10/15 1239    Clinical Impression Statement Pt continues to require min cues for alternating attention and gravitates heavily toward alternating attention in simple divided attention tasks. Continue skilled ST to maximize cognitive linguistic skills.   Speech Therapy Frequency 2x / week   Duration --  5 weeks   Treatment/Interventions Compensatory techniques;Internal/external aids;SLP instruction and feedback;Cognitive reorganization;Functional tasks;Patient/family education   Potential to Achieve Goals Good        Problem List Patient Active Problem List   Diagnosis Date Noted  . 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 Stroke (HCC) 06/19/2015  . Essential hypertension  06/19/2015  . Hypokalemia 06/19/2015  . Tobacco abuse 06/19/2015    [facility] , MS, CCC-SLP  08/10/2015, 12:42 PM  [facility] [address] [place], [place], [address] Phone: [phone]   Fax:  [phone]

## 2015-08-10 NOTE — Therapy (Signed)
[place] [address] [place], [place], [address] Phone: [phone]   Fax:  [phone]  Physical Therapy Treatment  Patient Details  Name: Catherine Floyd MRN: [id number] Date of Birth: January 20, 1951 Referring Provider:  Stephens Shire, MD  Encounter Date: 08/10/2015      PT End of Session - 08/10/15 1834    Visit Number 5   Number of Visits 17   Date for PT Re-Evaluation 09/11/15   Authorization Type Medicare - G Codes every 10 vists   PT Start Time 1016   PT Stop Time 1056   PT Time Calculation (min) 40 min   Activity Tolerance Patient tolerated treatment well   Behavior During Therapy [facility] for tasks assessed/performed      Past Medical History  Diagnosis Date  . Hypertension   . Cigarette nicotine dependence     Past Surgical History  Procedure Laterality Date  . Abdominal hysterectomy    . Appendectomy    . Tubal ligation      There were no vitals filed for this visit.  Visit Diagnosis:  Abnormality of gait  Unsteadiness  Hemiparesis, right (HCC)  Decreased coordination      Subjective Assessment - 08/10/15 1023    Subjective Pt reports that discomfort/stiffness in cervical spine, stating, "I do that exercise every night before bed, and it's really helping."   Pertinent History Cerebral infarction due to thrombosis of left middle cerebral artery (06/19/15), hypkalemia, HTN, tobacco abuse   Limitations Walking   Patient Stated Goals "I want my balance back."   Currently in Pain? No/denies                         [facility] Adult PT Treatment/Exercise - 08/10/15 0001    Ambulation/Gait   Ambulation/Gait Yes   Ambulation/Gait Assistance 5: Supervision;4: Min guard   Ambulation Distance (Feet) 400 Feet   Assistive device None   Gait Pattern Step-through pattern;Narrow base of support;Scissoring;Poor foot clearance - right   Ambulation Surface Level;Indoor   Gait  Comments With verbal/demonstration cueing, pt able to recall and effectively utilize compensatory strategies for impaired VOR with during ambulation.    Neuro Re-ed    Neuro Re-ed Details  Pt performed former home exercises with mod I using paper handout. Progressed exercises and made additions. See Pt Instructions for details on all exercises, reps/sets, frequency, duration.                PT Education - 08/10/15 1056    Education provided Yes   Education Details Progressed HEP. Revisited compensatory strategy for impaired VOR with turning.   Person(s) Educated Patient   Methods Explanation;Demonstration;Verbal cues;Handout   Comprehension Verbalized understanding;Returned demonstration          PT Short Term Goals - 08/10/15 1833    PT SHORT TERM GOAL #1   Title Pt will perform initial home exercise program with mod I using paper handout to indicate safe HEP compliance, to maximize functional gains. Target date: 08/10/15.   Baseline Met 10/6.   Status Achieved   PT SHORT TERM GOAL #2   Title Pt will increase self-selected gait speed from 3.09 ft/sec to 3.39 ft/sec to indicate increased efficiency of ambulation. Target date: 08/10/15.   Baseline 10/4:  self-selected gait speed = 3.07 ft/sec   Status Not Met   PT SHORT TERM GOAL #3   Title Pt will increase FGA score 15/30 to 19/30 to indicate  improved dynamic gait stability. Target date: 08/10/15.   Baseline 10/4: FGA score = 20/30   Status Achieved   PT SHORT TERM GOAL #4   Title Pt will independently negotiate standard ramp and curb step to indicate pt safety traversing community obstacles. Target date: 08/10/15.   Status Achieved           PT Long Term Goals - 07/13/15 1156    PT LONG TERM GOAL #1   Title Pt will verbalize understanding of fall prevention strategies to decrease risk of falling in home environment. Target date: 09/07/15.   PT LONG TERM GOAL #2   Title Patient will increase FGA score from 15 to 23/30 to  indicate decreased risk of falling.  Target date: 09/07/15.   PT LONG TERM GOAL #3   Title Pt will independently ambulate 1,000' over unlevel paved and grass surfaces with no overt LOB to indicate increased safety with community mobility. Target date: 09/07/15.   PT LONG TERM GOAL #4   Title Pt will verbalize understanding of fall prevention strategies to decrease risk of falling in home environment.  Target date: 09/07/15.   PT LONG TERM GOAL #5   Title Pt will verbalize understanding of CVA warning signs, pertinent risk factors to prevent future CVA. Target date: 09/07/15.   Additional Long Term Goals   Additional Long Term Goals Yes   PT LONG TERM GOAL #6   Title Pt will increase Activities-Specific Balance Confidence Scale score from 46.3% to >/= 57% to indicate increased balance confidence. Target date: 09/07/15.               Plan - 08/10/15 1835    Clinical Impression Statement Pt met 2 of 3 STG's, indicating safety/compliance with HEP and improved gait stability in presence of external demands. Progressed HEP during this session. Noted ineffective between-session (functional) carryover of compensatory strategies for impaired VOR during gait. Continue per POC.   Pt will benefit from skilled therapeutic intervention in order to improve on the following deficits Abnormal gait;Decreased activity tolerance;Decreased balance;Decreased endurance;Decreased coordination;Decreased safety awareness;Dizziness;Impaired UE functional use;Impaired sensation;Decreased cognition;Decreased strength   Rehab Potential Excellent   PT Frequency 2x / week   PT Duration 8 weeks   PT Treatment/Interventions ADLs/Self Care Home Management;DME Instruction;Gait training;Stair training;Functional mobility training;Therapeutic activities;Therapeutic exercise;Balance training;Patient/family education;Cognitive remediation;Neuromuscular re-education;Manual techniques;Vestibular   PT Next Visit Plan Continue with high  level gait/cognition, dual tasking, gait with head turns, gait outdoors and in crowded/narrow walkways (due to noted visuospatial deficit on R side)   Consulted and Agree with Plan of Care Patient        Problem List Patient Active Problem List   Diagnosis Date Noted  . Cerebral infarction due to thrombosis of left middle cerebral artery ([place])   . Stroke ([place]) 06/19/2015  . Essential hypertension 06/19/2015  . Hypokalemia 06/19/2015  . Tobacco abuse 06/19/2015    Billie Ruddy, PT, DPT [facility] [address] [place], [place], [address] Phone: [phone]   Fax:  [phone] 08/10/2015, 6:39 PM

## 2015-08-10 NOTE — Therapy (Signed)
[place] [address] [place], [place], [address] Phone: [phone]   Fax:  [phone]  Occupational Therapy Treatment  Patient Details  Name: Catherine Floyd MRN: [id number] Date of Birth: February 24, 1951 Referring Provider:  Stephens Shire, MD  Encounter Date: 08/10/2015      OT End of Session - 08/10/15 0947    Visit Number 8   Number of Visits 17   Date for OT Re-Evaluation 09/10/15   Authorization Type BCBS primary (20 visit limit),  Medicare secondary (g-code)   Authorization Time Period - G code next visit   Authorization - Visit Number 9   Authorization - Number of Visits 10   OT Start Time [phone]   OT Stop Time 1015   OT Time Calculation (min) 39 min   Activity Tolerance Patient tolerated treatment well   Behavior During Therapy [facility] for tasks assessed/performed      Past Medical History  Diagnosis Date  . Hypertension   . Cigarette nicotine dependence     Past Surgical History  Procedure Laterality Date  . Abdominal hysterectomy    . Appendectomy    . Tubal ligation      There were no vitals filed for this visit.  Visit Diagnosis:  Weakness due to cerebrovascular accident  Decreased coordination  Impaired sensation  Visuospatial deficit      Subjective Assessment - 08/10/15 0937    Pertinent History see Epic   Patient Stated Goals to use RUE   Currently in Pain? No/denies         Treatment: Therapist reviewed red theraband HEP with pt., she returned demonstration of all exercises with red theraband. Arm bike x 6 mins level 5 for conditioning. Self care: Environmental scanning task for simulated IADLS, pt missed 3/71 items on first pass. Pt located remaining items on second pass with 1 v.c. Therapist  discussed importance of being aware of how close she is walking to wall on right side.Tabletop scanning number cancellation with 100% accuracy. Therapist discussed compensatory  strategies for short term memory deficits with pt. She verbalized understanding.                         OT Short Term Goals - 08/10/15 0953    OT SHORT TERM GOAL #1   Title I with HEP.   Baseline Pt returns demonstration of red theraband   Status Achieved   OT SHORT TERM GOAL #2   Title Pt will demonstrate ability to perform basic cooking task modified independently demonstrating good safety awareness.   Status Achieved   OT SHORT TERM GOAL #3   Title Pt will improve RUE fine motor coordiantion as evidenced by decreasing  9 hole peg test score to 30 secs without drops.   Baseline 29.31 secs   Status Achieved           OT Long Term Goals - 08/02/15 1024    OT LONG TERM GOAL #1   Title Pt will demonstrate ability to perform mod complex cooking( 2 items simultaneously) modified independently demonstrating good safety awareness.   Status On-going   OT LONG TERM GOAL #2   Title Pt will perform simulated work activities at a modified independent level including placing items that ar 5 lbs on over head shelf with RUE without drops.   Status On-going   OT LONG TERM GOAL #3   Title Pt will demonstrate ability to carry 20 lbs, 25 ft  x 3 without rest break.   Status On-going   OT LONG TERM GOAL #4   Title Pt will demonstrate improved visual perceptual skills as evidenced by copying a simple design with 95% or better accuracy.   Status On-going   OT LONG TERM GOAL #5   Title Pt will use RUE as her dominant hand at least 90% of the time for ADLs/ Functional activities.   Status On-going               Plan - 08/10/15 0954    Clinical Impression Statement Pt met all short term goals. She is progressing towards long term goals.she continues to demonstrate visuospatial deficits on right side.   Pt will benefit from skilled therapeutic intervention in order to improve on the following deficits (Retired) Decreased coordination;Decreased range of motion;Difficulty  walking;Decreased endurance;Decreased safety awareness;Impaired sensation;Decreased knowledge of precautions;Decreased activity tolerance;Decreased balance;Decreased knowledge of use of DME;Impaired UE functional use;Impaired vision/preception;Impaired perceived functional ability;Decreased strength;Decreased mobility;Decreased cognition   Rehab Potential Good   OT Frequency 2x / week   OT Duration 8 weeks   OT Treatment/Interventions Self-care/ADL training;Therapeutic exercise;Patient/family education;Functional Mobility Training;Balance training;Manual Therapy;Neuromuscular education;Ultrasound;Energy conservation;Therapeutic exercises;Therapeutic activities;DME and/or AE instruction;Parrafin;Cryotherapy;Electrical Stimulation;Fluidtherapy;Cognitive remediation/compensation;Visual/perceptual remediation/compensation;Passive range of motion;Contrast Bath;Moist Heat   Plan simulated work activities, Fish farm manager   OT Home Exercise Plan issued : coordination 07/18/15, theraband 07/20/15 red   Consulted and Agree with Plan of Care Patient        Problem List Patient Active Problem List   Diagnosis Date Noted  . Cerebral infarction due to thrombosis of left middle cerebral artery ([place])   . Stroke ([place]) 06/19/2015  . Essential hypertension 06/19/2015  . Hypokalemia 06/19/2015  . Tobacco abuse 06/19/2015    Tanita Palinkas 08/10/2015, 9:55 AM Theone Murdoch, OTR/L [phone] Phone: [phone] 9:55 AM 10/06/2016[facility] [address] [place], [place], [address] Phone: [phone]   Fax:  [phone]

## 2015-08-10 NOTE — Patient Instructions (Signed)
  Please complete the assigned speech therapy homework and return it to your next session.  

## 2015-08-10 NOTE — Patient Instructions (Signed)
Gaze Stabilization: Standing Feet Apart   Feet shoulder width apart, keeping eyes on target on wall __6__ feet away, tilt head down 15-30 and move head side to side for 30 seconds. Do 3-4 sessions per day with glasses on.  Gaze Stabilization: Tip Card 1.Target must remain in focus, not blurry, and appear stationary while head is in motion. 2.Perform exercises with small head movements (45 to either side of midline). 3.Increase speed of head motion so long as target is in focus. 4.If you wear eyeglasses, be sure you can see target through lens (therapist will give specific instructions for bifocal / progressive lenses). 5.These exercises may provoke dizziness or nausea. Work through these symptoms. If too dizzy, slow head movement slightly. Rest between each exercise. 6.Exercises demand concentration; avoid distractions. 7.For safety, perform standing exercises close to a counter, wall, corner, or next to someone.  Feet Apart (Compliant Surface) Head Motion - Eyes Closed   Stand with your back to a corner with a stable chair in front of you. With eyes closed, standing on a pillow; feet shoulder width apart, move head slowly: right to left 10 times; up to down 10 times; diagonally up/right to down/left 10 times; and up/left to down/right 10 times. Repeat _10___ times per session. Do _2___ sessions per day. Make sure you do these in corner with chair in front of you for safety in case of LOB.     Throwing / Catching / Bouncing Ball: Compliant Surface   On pillow with feet apart, stand in corner with chair in front of you and toss ball to yourself. Make sure that your eyes follow the ball up then down with each toss. Repeat _10___ times per session. Do __2__ sessions per day.

## 2015-08-15 ENCOUNTER — Ambulatory Visit: Payer: BLUE CROSS/BLUE SHIELD | Admitting: Speech Pathology

## 2015-08-15 ENCOUNTER — Ambulatory Visit: Payer: BLUE CROSS/BLUE SHIELD | Admitting: Physical Therapy

## 2015-08-15 ENCOUNTER — Ambulatory Visit: Payer: BLUE CROSS/BLUE SHIELD | Admitting: Occupational Therapy

## 2015-08-15 DIAGNOSIS — R269 Unspecified abnormalities of gait and mobility: Secondary | ICD-10-CM | POA: Diagnosis not present

## 2015-08-15 DIAGNOSIS — R201 Hypoesthesia of skin: Secondary | ICD-10-CM

## 2015-08-15 DIAGNOSIS — G8191 Hemiplegia, unspecified affecting right dominant side: Secondary | ICD-10-CM

## 2015-08-15 DIAGNOSIS — I69319 Unspecified symptoms and signs involving cognitive functions following cerebral infarction: Secondary | ICD-10-CM

## 2015-08-15 DIAGNOSIS — R279 Unspecified lack of coordination: Secondary | ICD-10-CM

## 2015-08-15 DIAGNOSIS — R278 Other lack of coordination: Secondary | ICD-10-CM

## 2015-08-15 DIAGNOSIS — R42 Dizziness and giddiness: Secondary | ICD-10-CM

## 2015-08-15 DIAGNOSIS — R41842 Visuospatial deficit: Secondary | ICD-10-CM

## 2015-08-15 DIAGNOSIS — R2681 Unsteadiness on feet: Secondary | ICD-10-CM

## 2015-08-15 DIAGNOSIS — IMO0002 Reserved for concepts with insufficient information to code with codable children: Secondary | ICD-10-CM

## 2015-08-15 NOTE — Therapy (Signed)
[facility] [address] [place], [place], [address] Phone: [phone]   Fax:  [phone]  Speech Language Pathology Treatment  Patient Details  Name: Catherine Floyd MRN: [id number] Date of Birth: Oct 28, 1951 Referring Provider:  [name], MD  Encounter Date: 08/15/2015      End of Session - 08/15/15 1102    Visit Number 7   Number of Visits 16   Date for SLP Re-Evaluation 09/12/15   SLP Start Time 1018   SLP Stop Time  1102   SLP Time Calculation (min) 44 min   Activity Tolerance Patient tolerated treatment well      Past Medical History  Diagnosis Date  . Hypertension   . Cigarette nicotine dependence     Past Surgical History  Procedure Laterality Date  . Abdominal hysterectomy    . Appendectomy    . Tubal ligation      There were no vitals filed for this visit.  Visit Diagnosis: Cognitive deficits following cerebral infarction      Subjective Assessment - 08/15/15 1022    Subjective "I only made 1 mistake - I wrote something down twice" re: divided attention homework               ADULT SLP TREATMENT - 08/15/15 1022    General Information   Behavior/Cognition Cooperative;Alert;Pleasant mood   Treatment Provided   Treatment provided Cognitive-Linquistic   Pain Assessment   Pain Assessment No/denies pain   Cognitive-Linquistic Treatment   Treatment focused on Cognition   Skilled Treatment Facilitated divided attention playing moderately complex card game while answering questions and conversing about her work.  Pt 95% accurate with conversation, extended time and frequent self re-verbalizing of game rules during conversation. - 85% accuracy on game.   Assessment / Recommendations / Plan   Plan Continue with current plan of care   Progression Toward Goals   Progression toward goals Progressing toward goals          SLP Education - 08/15/15 1049    Education  provided Yes   Education Details alternating vs divided attention, attention strategies   Person(s) Educated Patient   Methods Explanation;Demonstration   Comprehension Verbalized understanding          SLP Short Term Goals - 08/15/15 1100    SLP SHORT TERM GOAL #1   Title Pt will alternate attention between 2 cognitive linguistic activities with 85% on each and rare min A   Time 1   Period Weeks   Status Achieved   SLP SHORT TERM GOAL #2   Title Pt will attend details in moderately complex cogntive linguistic tasks with occasional min A and 85% accuracy   Time 1   Period Weeks   Status On-going   SLP SHORT TERM GOAL #3   Title Pt will complete mildly complex executive function tasks (planning, organizing, analyzing) with 85% accuracy and rare min A   Time 1   Period Weeks   Status On-going          SLP Long Term Goals - 08/15/15 1102    SLP LONG TERM GOAL #1   Title Pt will divide attention between 2 simple cognitive attention tasks with rare min A and 90% on each task.   Time 4   Period Weeks   Status On-going   SLP LONG TERM GOAL #2   Title Pt will complete moderately coomplex executive function tasks with 90% accuracy and rare min A  Time 4   Period Weeks   Status On-going   SLP LONG TERM GOAL #3   Title Pt will verbalize 3 compensations for reduced attention with rare min A   Time 4   Period Weeks   Status On-going          Plan - 08/15/15 1052    Clinical Impression Statement Divided attention with frequent self cues and extended time on simple tasks. Continue skilled ST to maximize divided attention for eventual return to work"   Speech Therapy Frequency 2x / week   Treatment/Interventions Compensatory techniques;Internal/external aids;SLP instruction and feedback;Cognitive reorganization;Functional tasks;Patient/family education   Potential to Achieve Goals Good   Consulted and Agree with Plan of Care Patient        Problem List Patient Active  Problem List   Diagnosis Date Noted  . 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 Stroke (HCC) 06/19/2015  . Essential hypertension 06/19/2015  . Hypokalemia 06/19/2015  . Tobacco abuse 06/19/2015    , Radene Journey MS, CCC-SLP 08/15/2015, 11:03 AM  [facility] [address] [place], [place], [address] Phone: [phone]   Fax:  [phone]

## 2015-08-15 NOTE — Patient Instructions (Addendum)
Gaze Stabilization: Standing Feet Apart   Feet shoulder width apart, keeping eyes on target on wall __6__ feet away, tilt head down 15-30 and move head side to side for 30 seconds. Do 3-4 sessions per day with glasses on. * If your lightheadness or feeling of being "off" increases by more than 3 points out of 10, stop exercise and slowly walk until it returns to where you started.   Gaze Stabilization: Tip Card 1.Target must remain in focus, not blurry, and appear stationary while head is in motion. 2.Perform exercises with small head movements (45 to either side of midline). 3.Increase speed of head motion so long as target is in focus. 4.If you wear eyeglasses, be sure you can see target through lens (therapist will give specific instructions for bifocal / progressive lenses). 5.These exercises may provoke dizziness or nausea. Work through these symptoms. If too dizzy, slow head movement slightly. Rest between each exercise. 6.Exercises demand concentration; avoid distractions. 7.For safety, perform standing exercises close to a counter, wall, corner, or next to someone.  Feet Apart (Compliant Surface) Head Motion - Eyes Closed   Stand with your back to a corner with a stable chair in front of you. With eyes closed, standing on a pillow; feet shoulder width apart, move head slowly: right to left 10 times; up to down 10 times. If your lightheadness or feeling of being "off" increases by more than 3 points out of 10, stop exercise and slowly walk until it returns to where you started.   Do _2___ sessions per day. Make sure you do these in corner with chair in front of you for safety in case of LOB.     Throwing / Catching / Bouncing Ball: Compliant Surface   On pillow with feet apart, stand in corner with chair in front of you and toss ball to yourself. Make sure that your eyes follow the ball up then down with each toss. Repeat _10___ times per session. Do __2__ sessions per  day.   Compensatory Strategies: Corrective Saccades    1. Tape your two "A"'s on the wall about  __6__ inches apart at eye-level, move eyes to target, keep head still. 2. Then move head in direction of target while eyes remain on target. 3/4. Repeat in opposite direction. Perform sitting. Repeat sequence __20__ times per session. Do __2__ sessions per day.   Tandem Walking    Stand beside your kitchen countertop with one hand on the counter. Walk with each foot directly in front of other, heel of one foot touching toes of other foot with each step. Both feet straight ahead. Perform 5 laps (down and back) in this way. Do this twice per day.  Copyright  VHI. All rights reserved.

## 2015-08-15 NOTE — Therapy (Signed)
[facility] [address] [place], [place], [address] Phone: [phone]   Fax:  [phone]  Occupational Therapy Treatment  Patient Details  Name: Catherine Floyd MRN: [id number] Date of Birth: [birth year] Referring Provider:  [name], MD  Encounter Date: 08/15/2015      OT End of Session - 08/15/15 1216    Visit Number 9   Number of Visits 17   Date for OT Re-Evaluation 09/10/15   Authorization Type [employer]BS primary (20 visit limit),  Medicare secondary (g-code)   Authorization Time Period - G code next visit   Authorization - Visit Number 9   Authorization - Number of Visits 10   OT Start Time 1148   OT Stop Time 1230   OT Time Calculation (min) 42 min   Activity Tolerance Patient tolerated treatment well   Behavior During Therapy [facility] for tasks assessed/performed      Past Medical History  Diagnosis Date  . Hypertension   . Cigarette nicotine dependence     Past Surgical History  Procedure Laterality Date  . Abdominal hysterectomy    . Appendectomy    . Tubal ligation      There were no vitals filed for this visit.  Visit Diagnosis:  Cognitive deficits following cerebral infarction  Visuospatial deficit  Impaired sensation  Decreased coordination  Weakness due to cerebrovascular accident      Subjective Assessment - 08/15/15 1151    Pertinent History see Epic   Currently in Pain? No/denies          Treatment: upgraded HEP to green for increased resistance 10-15 reps. Pt required min v.c. initally, then returned demonstration. Pt held onto a chair back while performing exercises in standing, pt reports she is able to perform something similar at home. Environmental scanning task, pt missed 1/13 items , and items was on right side. Pt saw optometrist, yet she reports she needs further evaluation. Tabletop scanning for basic word search, pt reports min difficulty yet she  completed in a reasonable amount of time.  Dynamic reaching with RUE, supervision for safety and min v.c. for foot position.                       OT Short Term Goals - 08/10/15 0953    OT SHORT TERM GOAL #1   Title I with HEP.   Baseline Pt returns demonstration of red theraband   Status Achieved   OT SHORT TERM GOAL #2   Title Pt will demonstrate ability to perform basic cooking task modified independently demonstrating good safety awareness.   Status Achieved   OT SHORT TERM GOAL #3   Title Pt will improve RUE fine motor coordiantion as evidenced by decreasing  9 hole peg test score to 30 secs without drops.   Baseline 29.31 secs   Status Achieved           OT Long Term Goals - 08/02/15 1024    OT LONG TERM GOAL #1   Title Pt will demonstrate ability to perform mod complex cooking( 2 items simultaneously) modified independently demonstrating good safety awareness.   Status On-going   OT LONG TERM GOAL #2   Title Pt will perform simulated work activities at a modified independent level including placing items that ar 5 lbs on over head shelf with RUE without drops.   Status On-going   OT LONG TERM GOAL #3   Title Pt will demonstrate ability to  carry 20 lbs, 25 ft x 3 without rest break.   Status On-going   OT LONG TERM GOAL #4   Title Pt will demonstrate improved visual perceptual skills as evidenced by copying a simple design with 95% or better accuracy.   Status On-going   OT LONG TERM GOAL #5   Title Pt will use RUE as her dominant hand at least 90% of the time for ADLs/ Functional activities.   Status On-going               Plan - 08/15/15 1151    Clinical Impression Statement Pt is progressing toward goals with improving strength. Therapist upgraded theraband HEP to green. Pt demonstrates poor body awareness when standing and reaching, with decreased proprioception for right LE which puts her at risk to fall.   Pt will benefit from skilled  therapeutic intervention in order to improve on the following deficits (Retired) Decreased coordination;Decreased range of motion;Difficulty walking;Decreased endurance;Decreased safety awareness;Impaired sensation;Decreased knowledge of precautions;Decreased activity tolerance;Decreased balance;Decreased knowledge of use of DME;Impaired UE functional use;Impaired vision/preception;Impaired perceived functional ability;Decreased strength;Decreased mobility;Decreased cognition   Rehab Potential Good   OT Frequency 2x / week   OT Duration 8 weeks   OT Treatment/Interventions Self-care/ADL training;Therapeutic exercise;Patient/family education;Functional Mobility Training;Balance training;Manual Therapy;Neuromuscular education;Ultrasound;Energy conservation;Therapeutic exercises;Therapeutic activities;DME and/or AE instruction;Parrafin;Cryotherapy;Electrical Stimulation;Fluidtherapy;Cognitive remediation/compensation;Visual/perceptual remediation/compensation;Passive range of motion;Contrast Bath;Moist Heat   Plan G-code, dynamic reaching activities with supervision for balance, visual perceptual skills.   OT Home Exercise Plan issued : coordination 07/18/15, theraband 07/20/15 red, upgraded to green on 08/15/15   Consulted and Agree with Plan of Care Patient        Problem List Patient Active Problem List   Diagnosis Date Noted  . 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 Stroke (HCC) 06/19/2015  . Essential hypertension 06/19/2015  . Hypokalemia 06/19/2015  . Tobacco abuse 06/19/2015    Edenilson Austad 08/15/2015, 12:58 PM Keene Breath, OTR/L [phone] Phone: [phone] 12:58 PM 08/15/2015 [facility] [address] [place], [place], [address] Phone: [phone]   Fax:  [phone]

## 2015-08-15 NOTE — Therapy (Signed)
[place] [address] [place], [place], [address] Phone: [phone]   Fax:  [phone]  Physical Therapy Treatment  Patient Details  Name: Catherine Floyd MRN: [id number] Date of Birth: 1951-06-29 Referring Provider:  Stephens Shire, MD  Encounter Date: 08/15/2015      PT End of Session - 08/15/15 1850    Visit Number 6   Number of Visits 17   Date for PT Re-Evaluation 09/11/15   Authorization Type Medicare - G Codes every 10 vists   PT Start Time 1100   PT Stop Time 1145   PT Time Calculation (min) 45 min   Activity Tolerance Patient tolerated treatment well   Behavior During Therapy [facility] for tasks assessed/performed      Past Medical History  Diagnosis Date  . Hypertension   . Cigarette nicotine dependence     Past Surgical History  Procedure Laterality Date  . Abdominal hysterectomy    . Appendectomy    . Tubal ligation      There were no vitals filed for this visit.  Visit Diagnosis:  Abnormality of gait  Unsteadiness  Dizziness and giddiness  Hemiparesis, right [facility])      Subjective Assessment - 08/15/15 1110    Subjective "I went to the eye doctor at [facility]. He said I need new glasses because my right eye is pulling more than my left." Pt reports no changes. Denies falls. During vestibular exercises, pt reports feeling "lightheaded".   Pertinent History Cerebral infarction due to thrombosis of left middle cerebral artery (06/19/15), hypokalemia, HTN, tobacco abuse   Limitations Walking   Patient Stated Goals "I want my balance back."   Currently in Pain? No/denies                Vestibular Assessment - 08/15/15 0001    Symptom Behavior   Type of Dizziness Lightheadedness  and "funny feeling in my head"   Duration of Dizziness seconds; seems to be cumulative   Aggravating Factors Turning body quickly;Turning head quickly;Comment  balance exercises   Relieving  Factors Rest;Head stationary   Vestibulo-Occular Reflex   VOR 1 Head Only (x 1 viewing) inconsistently loses target; must slow head movement down to maintain visual fixation on target                 [facility] Adult PT Treatment/Exercise - 08/15/15 0001    Ambulation/Gait   Ambulation/Gait Yes   Ambulation/Gait Assistance 5: Supervision   Ambulation Distance (Feet) 350 Feet   Assistive device None   Gait Pattern Step-through pattern;Narrow base of support;Scissoring;Poor foot clearance - right   Ambulation Surface Level;Indoor   Gait Comments After performing balance/vestibular exercises, noted increased LE scissoring during gait.   Neuro Re-ed    Neuro Re-ed Details  Pt performed HEP provided during prior session. Noted significant A/P postural sway when performing functional head turns while standing on compliant surface with EC; modified HEP to remove diagonal head movements and instructed pt to monitor lightheadedness and, if lightheadedness increasedby > 3 (on 10-pt scale) to stop exercise and slowly walk until sypmtoms returned to baseline. Pt verbalized understanding and gave effective return demonstration. Added tandem walking  and compensatory strategy for impaired VOR (eyes then head) to HEP. See Pt Instructions for details on updated HEP.         Vestibular Treatment/Exercise - 08/15/15 0001    Vestibular Treatment/Exercise   Vestibular Treatment Provided Gaze   Gaze Exercises X1 Viewing Horizontal;Eye/Head  Exercise Horizontal   X1 Viewing Horizontal   Foot Position standing; feet shoulder width   Comments 30 seconds   Eye/Head Exercise Horizontal   Foot Position standing; feet shoulder width apart   Reps 20   Comments cueing for technique  added to HEP               PT Education - 08/15/15 1838    Education provided Yes   Education Details HEP: modified; See Pt Instructions. Recommended pt monitor lightheadedness and stop and walk if >3 point increase  during balance exercises.   Person(s) Educated Patient   Methods Explanation;Demonstration;Verbal cues;Handout   Comprehension Verbalized understanding;Returned demonstration;Need further instruction  May need to review at next session.          PT Short Term Goals - 08/10/15 1833    PT SHORT TERM GOAL #1   Title Pt will perform initial home exercise program with mod I using paper handout to indicate safe HEP compliance, to maximize functional gains. Target date: 08/10/15.   Baseline Met 10/6.   Status Achieved   PT SHORT TERM GOAL #2   Title Pt will increase self-selected gait speed from 3.09 ft/sec to 3.39 ft/sec to indicate increased efficiency of ambulation. Target date: 08/10/15.   Baseline 10/4:  self-selected gait speed = 3.07 ft/sec   Status Not Met   PT SHORT TERM GOAL #3   Title Pt will increase FGA score 15/30 to 19/30 to indicate improved dynamic gait stability. Target date: 08/10/15.   Baseline 10/4: FGA score = 20/30   Status Achieved   PT SHORT TERM GOAL #4   Title Pt will independently negotiate standard ramp and curb step to indicate pt safety traversing community obstacles. Target date: 08/10/15.   Status Achieved           PT Long Term Goals - 08/15/15 1113    PT LONG TERM GOAL #1   Title Pt will verbalize understanding of fall prevention strategies to decrease risk of falling in home environment. Target date: 09/07/15.   PT LONG TERM GOAL #2   Title Patient will increase FGA score from 15 to 23/30 to indicate decreased risk of falling.  Target date: 09/07/15.   PT LONG TERM GOAL #3   Title Pt will independently ambulate 1,000' over unlevel paved and grass surfaces with no overt LOB to indicate increased safety with community mobility. Target date: 09/07/15.   PT LONG TERM GOAL #4   Title Pt will verbalize understanding of fall prevention strategies to decrease risk of falling in home environment.  Target date: 09/07/15.   PT LONG TERM GOAL #5   Title Pt will  verbalize understanding of CVA warning signs, pertinent risk factors to prevent future CVA. Target date: 09/07/15.   Baseline 10/11: Pt able to verbalize risk factors without cueing; requires 50% cueing for warning signs.   Status Partially Met   PT LONG TERM GOAL #6   Title Pt will increase Activities-Specific Balance Confidence Scale score from 46.3% to >/= 57% to indicate increased balance confidence. Target date: 09/07/15.               Plan - 08/15/15 1850    Clinical Impression Statement Skilled session focused on increasing dynamic gait stability, adaptation to and compensation for central vestibular impairments. Noted that lightheadedness, imbalance was cumulative during this session, causing postural/gait stability to decline progressively throughout session with increased challange to vestibular system. Also, continue to note poor between-session carryover of compensatory  strategies. Therefore, added compensatory strategy to HEP. Continue per POC.   Pt will benefit from skilled therapeutic intervention in order to improve on the following deficits Abnormal gait;Decreased activity tolerance;Decreased balance;Decreased endurance;Decreased coordination;Decreased safety awareness;Dizziness;Impaired UE functional use;Impaired sensation;Decreased cognition;Decreased strength   Rehab Potential Excellent   PT Frequency 2x / week   PT Duration 8 weeks   PT Treatment/Interventions ADLs/Self Care Home Management;DME Instruction;Gait training;Stair training;Functional mobility training;Therapeutic activities;Therapeutic exercise;Balance training;Patient/family education;Cognitive remediation;Neuromuscular re-education;Manual techniques;Vestibular   PT Next Visit Plan Provide handout for tandem walking (can reprint Pt Instructions from 10/11). Reinforce use of compensatory strategies for central vestibular impairments.   Consulted and Agree with Plan of Care Patient        Problem  List Patient Active Problem List   Diagnosis Date Noted  . Cerebral infarction due to thrombosis of left middle cerebral artery ([place])   . Stroke ([place]) 06/19/2015  . Essential hypertension 06/19/2015  . Hypokalemia 06/19/2015  . Tobacco abuse 06/19/2015    Billie Ruddy, PT, DPT [facility] [address] [place], [place], [address] Phone: [phone]   Fax:  [phone] 08/15/2015, 7:09 PM

## 2015-08-16 ENCOUNTER — Other Ambulatory Visit: Payer: BLUE CROSS/BLUE SHIELD | Admitting: *Deleted

## 2015-08-16 NOTE — Patient Outreach (Signed)
[facility] Care Management  08/16/2015  Catherine CostaMargaret B Floyd 1951-04-21 [id number]  Emmi-stroke follow up call; left message on voice mail.  Plan: will follow up. Colleen CanLinda Arjan Strohm, RN BSN CCM Care Management Coordinator [facility] Care [facility]  [phone]

## 2015-08-17 ENCOUNTER — Other Ambulatory Visit: Payer: Self-pay | Admitting: *Deleted

## 2015-08-17 NOTE — Patient Outreach (Signed)
[facility] ([facility]) Care Management  08/17/2015  Ivor CostaMargaret B Coppolino [birth year] [id number]  Emmi stroke follow up call to patient; left message on voice mail requesting return call.  Plan: will follow up. Colleen CanLinda Atley Scarboro, RN BSN CCM Care Management Coordinator [facility] Care Management  [phone]

## 2015-08-18 ENCOUNTER — Ambulatory Visit: Payer: BLUE CROSS/BLUE SHIELD | Admitting: *Deleted

## 2015-08-18 ENCOUNTER — Ambulatory Visit: Payer: BLUE CROSS/BLUE SHIELD | Admitting: Rehabilitation

## 2015-08-18 ENCOUNTER — Encounter: Payer: Self-pay | Admitting: Rehabilitation

## 2015-08-18 ENCOUNTER — Ambulatory Visit: Payer: BLUE CROSS/BLUE SHIELD

## 2015-08-18 DIAGNOSIS — R269 Unspecified abnormalities of gait and mobility: Secondary | ICD-10-CM

## 2015-08-18 DIAGNOSIS — R279 Unspecified lack of coordination: Secondary | ICD-10-CM

## 2015-08-18 DIAGNOSIS — IMO0002 Reserved for concepts with insufficient information to code with codable children: Secondary | ICD-10-CM

## 2015-08-18 DIAGNOSIS — R278 Other lack of coordination: Secondary | ICD-10-CM

## 2015-08-18 DIAGNOSIS — R42 Dizziness and giddiness: Secondary | ICD-10-CM

## 2015-08-18 DIAGNOSIS — I69319 Unspecified symptoms and signs involving cognitive functions following cerebral infarction: Secondary | ICD-10-CM

## 2015-08-18 DIAGNOSIS — R41842 Visuospatial deficit: Secondary | ICD-10-CM

## 2015-08-18 DIAGNOSIS — R2681 Unsteadiness on feet: Secondary | ICD-10-CM

## 2015-08-18 NOTE — Therapy (Signed)
[facility] [address] [place], [place], [address] Phone: [phone]   Fax:  [phone]  Occupational Therapy Treatment  Patient Details  Name: Catherine CostaMargaret B Ortwein MRN: [id number] Date of Birth: 1951-02-16 No Data Recorded  Encounter Date: 08/18/2015      OT End of Session - 08/18/15 0940    Visit Number 10   Number of Visits 17   Date for OT Re-Evaluation 09/10/15   Authorization Type BCBS primary (20 visit limit),  Medicare secondary (g-code)   Authorization - Visit Number 10   Authorization - Number of Visits 10   OT Start Time [id number]   OT Stop Time 0929   OT Time Calculation (min) 47 min   Activity Tolerance Patient tolerated treatment well   Behavior During Therapy [facility] for tasks assessed/performed      Past Medical History  Diagnosis Date  . Hypertension   . Cigarette nicotine dependence     Past Surgical History  Procedure Laterality Date  . Abdominal hysterectomy    . Appendectomy    . Tubal ligation      There were no vitals filed for this visit.  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 No/denies   Multiple Pain Sites No        Treatment:  Pt engaged in simulated cooking task in kitchen to work towards LTG#1. Pt required min verbal instructional cueing for following directions and initiating tasks for activity. During activity pt with fair balance, standing with small base of support. Pt then engaged in therapeutic activities focusing on LTG#2, LTG#4, and LTG#5. Simulated work task as patient's stated goal is to get back to working night shift at [employer]. Pt stood  on BoSu ball for dynamic standing balance while reaching with RUE and LUE. While standing on BoSu ball, pt required min-mod assist to maintain dynamic standing balance, used gait belt for safety. Pt performed simulated work-task activity of stacking/unstacking objects and transporting objects from counter to shelf, taking a few steps in-between. During each activity focused on visual scanning, dynamic standing balance in wide base of support, activity tolerance/endurance, and higher level/multi-tasking. At end of session, reviewed what patient and therapist completed and explained to patient why we were doing each task. Pt receptive to OT and eager to get back to work.   Pt reports that she got her new glasses yesterday and that she is getting accustomed to them now.                     OT Education - 08/18/15 0933    Education provided Yes   Education Details Encouraged pt to focus on breathing during activity/tasks and making sure she keeps a wider base of support during static & dyanmic standing activities to decrease fall risk   Person(s) Educated Patient   Methods Explanation;Demonstration   Comprehension Verbalized understanding  needs reinforcement          OT Short Term Goals - 08/18/15 0939    OT SHORT TERM GOAL #1   Title I with HEP.   Baseline Pt returns demonstration of red theraband   Status  Achieved   OT SHORT TERM GOAL #2   Title Pt will demonstrate ability to perform basic cooking task modified independently demonstrating good safety awareness.   Status Achieved   OT SHORT TERM GOAL #3   Title Pt will improve RUE fine motor coordiantion as evidenced by decreasing  9 hole peg test score to 30 secs without drops.   Baseline 29.31 secs   Status Achieved           OT Long Term Goals - 10/14Nov 12, [id number]    OT LONG TERM GOAL #1   Title Pt will demonstrate ability to perform mod complex cooking( 2 items simultaneously) modified independently demonstrating good  safety awareness.   Status On-going   OT LONG TERM GOAL #2   Title Pt will perform simulated work activities at a modified independent level including placing items that ar 5 lbs on over head shelf with RUE without drops.   Status On-going   OT LONG TERM GOAL #3   Title Pt will demonstrate ability to carry 20 lbs, 25 ft x 3 without rest break.   Status On-going   OT LONG TERM GOAL #4   Title Pt will demonstrate improved visual perceptual skills as evidenced by copying a simple design with 95% or better accuracy.   Status On-going   OT LONG TERM GOAL #5   Title Pt will use RUE as her dominant hand at least 90% of the time for ADLs/ Functional activities.   Status On-going               Plan - 09-16-2015 0936    Clinical Impression Statement Pt continues to progress nicely towards OT goals. Pt continues to be limited by decreased standing balance, decreased visuospatial awareness, decreased ability to multitask/complete higher level cognitive functions. Pt with narrow base of support during functional mobility and dynamic standing which continues to make her at higher risk to fall.    Pt will benefit from skilled therapeutic intervention in order to improve on the following deficits (Retired) Decreased coordination;Decreased range of motion;Difficulty walking;Decreased endurance;Decreased safety awareness;Impaired sensation;Decreased knowledge of precautions;Decreased activity tolerance;Decreased balance;Decreased knowledge of use of DME;Impaired UE functional use;Impaired vision/preception;Impaired perceived functional ability;Decreased strength;Decreased mobility;Decreased cognition   Rehab Potential Good   OT Frequency 2x / week   OT Duration 8 weeks   OT Treatment/Interventions Self-care/ADL training;Therapeutic exercise;Patient/family education;Functional Mobility Training;Balance training;Manual Therapy;Neuromuscular education;Ultrasound;Energy conservation;Therapeutic  exercises;Therapeutic activities;DME and/or AE instruction;Parrafin;Cryotherapy;Electrical Stimulation;Fluidtherapy;Cognitive remediation/compensation;Visual/perceptual remediation/compensation;Passive range of motion;Contrast Bath;Moist Heat   Plan simulated work activities, visual perceptual skills, dynamic standing balance (using wider base of support)    OT Home Exercise Plan issued : coordination 07/18/15, theraband 07/20/15 red, upgraded to green on 08/15/15   Consulted and Agree with Plan of Care Patient          G-Codes - 09-16-2015 0945    Functional Assessment Tool Used based on clinical judgement    Functional Limitation Carrying, moving and handling objects   Carrying, Moving and Handling Objects Current Status (Z6109) At least 20 percent but less than 40 percent impaired, limited or restricted   Carrying, Moving and Handling Objects Goal Status (U0454) At least 1 percent but less than 20 percent impaired, limited or restricted      Problem List Patient Active Problem List   Diagnosis Date Noted  . 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 Stroke (HCC) 06/19/2015  . Essential hypertension 06/19/2015  . Hypokalemia 06/19/2015  . Tobacco abuse  06/19/2015    Jarek Longton , MS, OTR/L, CLT  08/18/2015, 9:46 AM  [facility] [address] [place], [place], [address] Phone: [phone]   Fax:  [phone]  Name: JEMEKA WAGLER MRN: [id number] Date of Birth: 10-04-1951

## 2015-08-18 NOTE — Therapy (Signed)
[facility] [address] [place], [place], [address] Phone: [phone]   Fax:  [phone]  Physical Therapy Treatment  Patient Details  Name: Catherine Floyd MRN: [id number] Date of Birth: 1950/12/26 No Data Recorded  Encounter Date: 08/18/2015      PT End of Session - 08/18/15 1205    Visit Number 7   Number of Visits 17   Date for PT Re-Evaluation 09/11/15   Authorization Type Medicare - G Codes every 10 vists   PT Start Time 0930   PT Stop Time 1015   PT Time Calculation (min) 45 min   Activity Tolerance Patient tolerated treatment well   Behavior During Therapy [facility] for tasks assessed/performed      Past Medical History  Diagnosis Date  . Hypertension   . Cigarette nicotine dependence     Past Surgical History  Procedure Laterality Date  . Abdominal hysterectomy    . Appendectomy    . Tubal ligation      There were no vitals filed for this visit.  Visit Diagnosis:  Abnormality of gait  Unsteadiness  Dizziness and giddiness      Subjective Assessment - 08/18/15 0932    Subjective "I have been wearing my new glasses, this is my second day."  No other changes and no falls since last visit.    Pertinent History Cerebral infarction due to thrombosis of left middle cerebral artery (06/19/15), hypokalemia, HTN, tobacco abuse   Limitations Walking   Patient Stated Goals "I want my balance back."   Currently in Pain? No/denies            NMR:  Addressed high level balance and adaptation to vestibular deficits with scanning while in balance master with responsive floor.  Had pt tap to targets called out by therapist, single letter>double letter>to making words while tapping to them each time.  Pt did not get above 2/10 dizziness throughout, but did allow intermittent rest breaks with "A" approx 8-10" from face while targeting to decrease dizziness prior to continuing with task.  No overt LOB  during task.  Also had responsive floor while pt performed gaze stabilization with horizontal and vertical head movements.   Then worked on gait while holding ball moving in circular pattern while following motion with head and eyes.  Note marked decrease in balance with scissoring and LOB requiring min A to correct.    Therapeutic activity:  Attempted to simulate work duties in hopes that she will be released to return to work from neurologist on 09/07/15.  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  Had pt ambulate while carrying crate with items into kitchen and worked on retrieving from waist level to shelf single item at a time.  Note pt tends to move very quickly and therefore provided cues to be careful at work and work at slower pace in order to use compensatory strategies with visual targeting.                       PT Education - 08/18/15 1204    Education provided Yes   Education Details Provided education regarding compensatory vestibular strategies to reduce dizziness, esp if/when she returns to work.     Person(s) Educated Patient   Methods Explanation   Comprehension Verbalized understanding;Need further instruction          PT Short Term Goals - 08/10/15 1833    PT SHORT  TERM GOAL #1   Title Pt will perform initial home exercise program with mod I using paper handout to indicate safe HEP compliance, to maximize functional gains. Target date: 08/10/15.   Baseline Met 10/6.   Status Achieved   PT SHORT TERM GOAL #2   Title Pt will increase self-selected gait speed from 3.09 ft/sec to 3.39 ft/sec to indicate increased efficiency of ambulation. Target date: 08/10/15.   Baseline 10/4:  self-selected gait speed = 3.07 ft/sec   Status Not Met   PT SHORT TERM GOAL #3   Title Pt will increase FGA score 15/30 to 19/30 to indicate improved dynamic gait stability. Target date: 08/10/15.   Baseline 10/4: FGA score = 20/30   Status Achieved    PT SHORT TERM GOAL #4   Title Pt will independently negotiate standard ramp and curb step to indicate pt safety traversing community obstacles. Target date: 08/10/15.   Status Achieved           PT Long Term Goals - 08/15/15 1113    PT LONG TERM GOAL #1   Title Pt will verbalize understanding of fall prevention strategies to decrease risk of falling in home environment. Target date: 09/07/15.   PT LONG TERM GOAL #2   Title Patient will increase FGA score from 15 to 23/30 to indicate decreased risk of falling.  Target date: 09/07/15.   PT LONG TERM GOAL #3   Title Pt will independently ambulate 1,000' over unlevel paved and grass surfaces with no overt LOB to indicate increased safety with community mobility. Target date: 09/07/15.   PT LONG TERM GOAL #4   Title Pt will verbalize understanding of fall prevention strategies to decrease risk of falling in home environment.  Target date: 09/07/15.   PT LONG TERM GOAL #5   Title Pt will verbalize understanding of CVA warning signs, pertinent risk factors to prevent future CVA. Target date: 09/07/15.   Baseline 10/11: Pt able to verbalize risk factors without cueing; requires 50% cueing for warning signs.   Status Partially Met   PT LONG TERM GOAL #6   Title Pt will increase Activities-Specific Balance Confidence Scale score from 46.3% to >/= 57% to indicate increased balance confidence. Target date: 09/07/15.               Plan - 08/18/15 1205    Clinical Impression Statement Skilled session continues to focus on high level balance and gait stability with adaptation for vestibular impairments.  Tolerated all well with no increase above 2/10 during session.  Began simulation of work duties with education on compensation and importance of rest breaks to prevent over stimulation of vestibular system.    Pt will benefit from skilled therapeutic intervention in order to improve on the following deficits Abnormal gait;Decreased activity  tolerance;Decreased balance;Decreased endurance;Decreased coordination;Decreased safety awareness;Dizziness;Impaired UE functional use;Impaired sensation;Decreased cognition;Decreased strength   Rehab Potential Excellent   PT Frequency 2x / week   PT Duration 8 weeks   PT Treatment/Interventions ADLs/Self Care Home Management;DME Instruction;Gait training;Stair training;Functional mobility training;Therapeutic activities;Therapeutic exercise;Balance training;Patient/family education;Cognitive remediation;Neuromuscular re-education;Manual techniques;Vestibular   PT Next Visit Plan Provide handout for tandem walking (can reprint Pt Instructions from 10/11). Reinforce use of compensatory strategies for central vestibular impairments.   Consulted and Agree with Plan of Care Patient        Problem List Patient Active Problem List   Diagnosis Date Noted  . Cerebral infarction due to thrombosis of left middle cerebral artery ([place])   . Stroke (  [place]) 06/19/2015  . Essential hypertension 06/19/2015  . Hypokalemia 06/19/2015  . Tobacco abuse 06/19/2015    Cameron Sprang, PT, MPT [facility] [address] [place], [place], [address] Phone: [phone]   Fax:  [phone] 08/18/2015, 12:08 PM

## 2015-08-18 NOTE — Therapy (Signed)
[facility] [address] [place], [place], [address] Phone: [phone]   Fax:  [phone]  Speech Language Pathology Treatment  Patient Details  Name: Catherine Floyd MRN: [id number] Date of Birth: Jun 09, 1951 No Data Recorded  Encounter Date: 08/18/2015      End of Session - 08/18/15 1255    Visit Number 8   Number of Visits 16   Date for SLP Re-Evaluation 09/12/15   SLP Start Time 1017   SLP Stop Time  1100   SLP Time Calculation (min) 43 min   Activity Tolerance Patient tolerated treatment well      Past Medical History  Diagnosis Date  . Hypertension   . Cigarette nicotine dependence     Past Surgical History  Procedure Laterality Date  . Abdominal hysterectomy    . Appendectomy    . Tubal ligation      There were no vitals filed for this visit.  Visit Diagnosis: Cerebrovascular accident with cognitive communication deficit             ADULT SLP TREATMENT - 08/18/15 1026    General Information   Behavior/Cognition Cooperative;Alert;Pleasant mood   Treatment Provided   Treatment provided Cognitive-Linquistic   Pain Assessment   Pain Assessment No/denies pain   Cognitive-Linquistic Treatment   Treatment focused on Cognition   Skilled Treatment Simple-mod complex executive function problems solved appropriately 4/4, with good/excellent mental flexibility as well.  Detailed written instrcutions completed with approximately 80% success. Pt needed cues to return to catch errors after she spontaneously self corrected x1 (reduced error awareness). SLP explained to patient she would benefit from another person checking her detailed tasks such as billpay and filling out improtant forms, and that she needed to cont to double and triple check if nobody else around to help her detect errors.   Assessment / Recommendations / Plan   Plan Continue with current plan of care   Progression Toward  Goals   Progression toward goals Progressing toward goals          SLP Education - 08/18/15 1254    Education provided Yes   Education Details need someone else to check important detailed tasks she may compelete, need to double check consistently   Person(s) Educated Patient   Methods Explanation   Comprehension Verbalized understanding;Returned demonstration;Need further instruction          SLP Short Term Goals - 08/18/15 1019    SLP SHORT TERM GOAL #1   Title Pt will alternate attention between 2 cognitive linguistic activities with 85% on each and rare min A   Time 1   Period Weeks   Status Achieved   SLP SHORT TERM GOAL #2   Title Pt will attend details in moderately complex cogntive linguistic tasks with occasional min A and 85% accuracy   Time 1   Period Weeks   Status On-going   SLP SHORT TERM GOAL #3   Title Pt will complete mildly complex executive function tasks (planning, organizing, analyzing) with 85% accuracy and rare min A   Time --   Period --   Status Achieved  08-18-15          SLP Long Term Goals - 08/18/15 1019    SLP LONG TERM GOAL #1   Title Pt will divide attention between 2 simple cognitive attention tasks with rare min A and 90% on each task.   Time 4   Period Weeks   Status On-going  SLP LONG TERM GOAL #2   Title Pt will complete moderately coomplex executive function tasks with 90% accuracy and rare min A   Time 4   Period Weeks   Status On-going   SLP LONG TERM GOAL #3   Title Pt will verbalize 3 compensations for reduced attention with rare min A   Time 4   Period Weeks   Status On-going          Plan - 08/18/15 1256    Clinical Impression Statement Pt met STG for simple executive function, but did not for detailed cognitive lingustiic tasks. Continue skilled ST to maximize divided attention for eventual return to work"   Speech Therapy Frequency 2x / week   Duration 4 weeks   Treatment/Interventions Compensatory  techniques;Internal/external aids;SLP instruction and feedback;Cognitive reorganization;Functional tasks;Patient/family education   Potential to Achieve Goals Good        Problem List Patient Active Problem List   Diagnosis Date Noted  . Cerebral infarction due to thrombosis of left middle cerebral artery ([place])   . Stroke ([place]) 06/19/2015  . Essential hypertension 06/19/2015  . Hypokalemia 06/19/2015  . Tobacco abuse 06/19/2015    [facility] , [place], [place]  08/18/2015, 12:57 PM  [facility] [address] [place], [place], [address] Phone: [phone]   Fax:  [phone]   Name: Catherine Floyd MRN: [id number] Date of Birth: 1951-02-27

## 2015-08-18 NOTE — Patient Instructions (Signed)
In detailed tasks such as bill paying and filling out forms, and possibly medication refills you may need another set of eyes on your work.

## 2015-08-21 ENCOUNTER — Ambulatory Visit: Payer: BLUE CROSS/BLUE SHIELD | Admitting: Occupational Therapy

## 2015-08-21 ENCOUNTER — Encounter: Payer: Self-pay | Admitting: Occupational Therapy

## 2015-08-21 ENCOUNTER — Ambulatory Visit: Payer: BLUE CROSS/BLUE SHIELD | Admitting: Physical Therapy

## 2015-08-21 ENCOUNTER — Ambulatory Visit: Payer: BLUE CROSS/BLUE SHIELD

## 2015-08-21 DIAGNOSIS — IMO0002 Reserved for concepts with insufficient information to code with codable children: Secondary | ICD-10-CM

## 2015-08-21 DIAGNOSIS — R2681 Unsteadiness on feet: Secondary | ICD-10-CM

## 2015-08-21 DIAGNOSIS — R41842 Visuospatial deficit: Secondary | ICD-10-CM

## 2015-08-21 DIAGNOSIS — R269 Unspecified abnormalities of gait and mobility: Secondary | ICD-10-CM

## 2015-08-21 DIAGNOSIS — R278 Other lack of coordination: Secondary | ICD-10-CM

## 2015-08-21 DIAGNOSIS — R201 Hypoesthesia of skin: Secondary | ICD-10-CM

## 2015-08-21 DIAGNOSIS — R42 Dizziness and giddiness: Secondary | ICD-10-CM

## 2015-08-21 DIAGNOSIS — I69319 Unspecified symptoms and signs involving cognitive functions following cerebral infarction: Secondary | ICD-10-CM

## 2015-08-21 DIAGNOSIS — R279 Unspecified lack of coordination: Secondary | ICD-10-CM

## 2015-08-21 DIAGNOSIS — G8191 Hemiplegia, unspecified affecting right dominant side: Secondary | ICD-10-CM

## 2015-08-21 NOTE — Patient Instructions (Signed)
  Please complete the assigned speech therapy homework and return it to your next session.  

## 2015-08-21 NOTE — Therapy (Signed)
[facility] [address] [place], [place], [address] Phone: [phone]   Fax:  [phone]  Physical Therapy Treatment  Patient Details  Name: Catherine Floyd MRN: [id number] Date of Birth: 1951/05/25 No Data Recorded  Encounter Date: 08/21/2015      PT End of Session - 08/21/15 2031    Visit Number 8   Number of Visits 17   Date for PT Re-Evaluation 09/11/15   Authorization Type Medicare - G Codes every 10 vists   PT Start Time 1102   PT Stop Time 1150   PT Time Calculation (min) 48 min   Activity Tolerance Patient tolerated treatment well   Behavior During Therapy [facility] for tasks assessed/performed      Past Medical History  Diagnosis Date  . Hypertension   . Cigarette nicotine dependence     Past Surgical History  Procedure Laterality Date  . Abdominal hysterectomy    . Appendectomy    . Tubal ligation      There were no vitals filed for this visit.  Visit Diagnosis:  Abnormality of gait  Unsteadiness  Dizziness and giddiness  Hemiparesis, right [facility])      Subjective Assessment - 08/21/15 1103    Subjective Pt sees neurologist 11/3. During neuro appt., pt expecting to find out if cleared to return to work. Job duties include reading labels on boxes, take merchandise of box, and putting merchanidise in shelves (reaching overhead reaching, bending over). 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 No/denies                Vestibular Assessment - 08/21/15 0001    Symptom Behavior   Type of Dizziness Lightheadedness  "funny feeling in my  head"; blurred vision; vision"bouncing"   Duration of Dizziness seconds   Aggravating Factors Turning body quickly;Turning head quickly;Comment  balance exercises   Relieving Factors Rest;Head stationary   Occulomotor Exam   Occulomotor Alignment Normal   Spontaneous Absent   Smooth Pursuits Saccades   Saccades Poor trajectory   Comment Smooth pursuits: with R monocular vision, noted saccades with smooth pursuit from L to midline; accompanied by concordant feeling of lightheadedness. Saccades: noted undershooting with horizontal saccades from L to midline.   Vestibulo-Occular Reflex   VOR Cancellation Normal                 [facility] Adult PT Treatment/Exercise - 08/21/15 0001    Ambulation/Gait   Ambulation/Gait Yes   Ambulation/Gait Assistance 5: Supervision   Ambulation Distance (Feet) 275 Feet   Assistive device None   Gait Pattern Step-through pattern;Narrow base of support;Scissoring;Poor foot clearance - right   Ambulation Surface Level;Indoor   Neuro Re-ed    Neuro Re-ed Details  See Vestibular Assessment for findings. See Vesitbular Treatment for further details on exercises performed, modified. Also performed work simulation task in which pt took towels from low shelf and placed towels on [place] shelf 2 x20 reps; during initial trial, pt utilized visual targets to compensate for impaired VOR. During second trial, pt compensated without visual targets. Pt reported no increase in disequilibrium throughout, but did note difficulty perceiving "how close I'm standing  to things sometimes." Cueing focused on visual spotting, normalized BOS (due to pt tendency toward very narrow BOS), bending hips/knees to reach low shelf (as bending at waist increased dizziness to 2/10), placing hand on countertop to improve perception of pt distance from countertop, and cueing to increase pt awareness of cumulative nature of dizziness.         Vestibular Treatment/Exercise - 08/21/15 0001     Vestibular Treatment/Exercise   Vestibular Treatment Provided Gaze   Gaze Exercises X1 Viewing Horizontal;X1 Viewing Vertical;Eye/Head Exercise Horizontal   X1 Viewing Horizontal   Foot Position standing   Comments 45 sec   Eye/Head Exercise Horizontal   Foot Position standing  transitioned to seated   Reps 10  then 20 reps   Comments Attempted in standing x10 reps; however, due to noted pt difficulty with exercise, performed in seated with visual target's approx.arm's length away and 6" apart.               PT Education - 08/21/15 2015    Education provided Yes   Education Details Explained findings, functional implications of occulomotor/vestib assessment. Modified "eyes then head" exercise to seated. Fall prevention strategies,   Person(s) Educated Patient   Methods Explanation;Demonstration;Verbal cues;Handout   Comprehension Verbalized understanding;Returned demonstration          PT Short Term Goals - 08/10/15 1833    PT SHORT TERM GOAL #1   Title Pt will perform initial home exercise program with mod I using paper handout to indicate safe HEP compliance, to maximize functional gains. Target date: 08/10/15.   Baseline Met 10/6.   Status Achieved   PT SHORT TERM GOAL #2   Title Pt will increase self-selected gait speed from 3.09 ft/sec to 3.39 ft/sec to indicate increased efficiency of ambulation. Target date: 08/10/15.   Baseline 10/4:  self-selected gait speed = 3.07 ft/sec   Status Not Met   PT SHORT TERM GOAL #3   Title Pt will increase FGA score 15/30 to 19/30 to indicate improved dynamic gait stability. Target date: 08/10/15.   Baseline 10/4: FGA score = 20/30   Status Achieved   PT SHORT TERM GOAL #4   Title Pt will independently negotiate standard ramp and curb step to indicate pt safety traversing community obstacles. Target date: 08/10/15.   Status Achieved           PT Long Term Goals - 08/21/15 1110    PT LONG TERM GOAL #1   Title Pt will  verbalize understanding of fall prevention strategies to decrease risk of falling in home environment. Target date: 09/07/15.   Baseline Discussed and provided paper handout on 10/17.   PT LONG TERM GOAL #2   Title Patient will increase FGA score from 15 to 23/30 to indicate decreased risk of falling.  Target date: 09/07/15.   PT LONG TERM GOAL #3   Title Pt will independently ambulate 1,000' over unlevel paved and grass surfaces with no overt LOB to indicate increased safety with community mobility. Target date: 09/07/15.   PT LONG TERM GOAL #4   Title Pt will verbalize understanding of fall prevention strategies to decrease risk of falling in home environment.  Target date: 09/07/15.   PT LONG TERM GOAL #5   Title Pt will verbalize understanding of CVA warning signs, pertinent risk factors to prevent future CVA. Target date: 09/07/15.   Baseline 10/11: Pt able to verbalize risk factors without cueing; requires 50% cueing for warning signs.   Status Partially  Met   PT LONG TERM GOAL #6   Title Pt will increase Activities-Specific Balance Confidence Scale score from 46.3% to >/= 57% to indicate increased balance confidence. Target date: 09/07/15.               Plan - 08/21/15 2032    Clinical Impression Statement Session focused on adaptation to/compensation for disequilibrium. Oculomotor/vestibular exam suggestive of visual-vestibular integration issues. Noted improved pt awareness of, compensation for deficits during this session. Continue per POC.    Pt will benefit from skilled therapeutic intervention in order to improve on the following deficits Abnormal gait;Decreased activity tolerance;Decreased balance;Decreased endurance;Decreased coordination;Decreased safety awareness;Dizziness;Impaired sensation;Decreased cognition;Decreased strength   Rehab Potential Excellent   PT Frequency 2x / week   PT Duration 8 weeks   PT Treatment/Interventions ADLs/Self Care Home Management;DME  Instruction;Gait training;Stair training;Functional mobility training;Therapeutic activities;Therapeutic exercise;Balance training;Patient/family education;Cognitive remediation;Neuromuscular re-education;Manual techniques;Vestibular   PT Next Visit Plan Continue to simulate work/functional activities. Consider adding squats to HEP (as pt has difficulty squatting and gets dizzy/off-balance with bending over at waist).   Consulted and Agree with Plan of Care Patient        Problem List Patient Active Problem List   Diagnosis Date Noted  . Cerebral infarction due to thrombosis of left middle cerebral artery ([place])   . Stroke ([place]) 06/19/2015  . Essential hypertension 06/19/2015  . Hypokalemia 06/19/2015  . Tobacco abuse 06/19/2015    Billie Ruddy, PT, DPT [facility] [address] [place], [place], [address] Phone: [phone]   Fax:  [phone] 08/21/2015, 8:36 PM   Name: Catherine Floyd MRN: [id number] Date of Birth: Jun 16, 1951

## 2015-08-21 NOTE — Therapy (Signed)
[facility] [address] [place], [place], [address] Phone: [phone]   Fax:  [phone]  Speech Language Pathology Treatment  Patient Details  Name: Catherine Floyd MRN: [id number] Date of Birth: [birth year] No Data Recorded  Encounter Date: 08/21/2015      End of Session - 08/21/15 1011    Visit Number 9   Number of Visits 16   Date for SLP Re-Evaluation 09/12/15   SLP Start Time 0934   SLP Stop Time  1015   SLP Time Calculation (min) 41 min   Activity Tolerance Patient tolerated treatment well      Past Medical History  Diagnosis Date  . Hypertension   . Cigarette nicotine dependence     Past Surgical History  Procedure Laterality Date  . Abdominal hysterectomy    . Appendectomy    . Tubal ligation      There were no vitals filed for this visit.  Visit Diagnosis: Cerebrovascular accident with cognitive communication deficit      Subjective Assessment - 08/21/15 0936    Subjective Pt recalls SLP/she talking about detailed information she does may require someone else to check it.   Currently in Pain? No/denies               ADULT SLP TREATMENT - 08/21/15 0938    General Information   Behavior/Cognition Cooperative;Alert;Pleasant mood   Treatment Provided   Treatment provided Cognitive-Linquistic   Cognitive-Linquistic Treatment   Treatment focused on Cognition   Skilled Treatment Pt brought detailed homework back without errors however when checking it with SLP pt missed one response consistently, requiring SLP cues. In a detailed written and reading task pt completed with 78% success. Reduced error awareness cont'd.    Assessment / Recommendations / Plan   Plan Continue with current plan of care   Progression Toward Goals   Progression toward goals Progressing toward goals            SLP Short Term Goals - 08/21/15 0942    SLP SHORT TERM GOAL #1   Title Pt will  alternate attention between 2 cognitive linguistic activities with 85% on each and rare min A   Time 1   Period Weeks   Status Achieved   SLP SHORT TERM GOAL #2   Title Pt will attend details in moderately complex cogntive linguistic tasks with occasional min A and 85% accuracy   Status Not Met   SLP SHORT TERM GOAL #3   Title Pt will complete mildly complex executive function tasks (planning, organizing, analyzing) with 85% accuracy and rare min A   Status Achieved  08-18-15          SLP Long Term Goals - 08/21/15 0943    SLP LONG TERM GOAL #1   Title Pt will divide attention between 2 simple cognitive attention tasks with rare min A and 90% on each task.   Time 3   Period Weeks   Status On-going   SLP LONG TERM GOAL #2   Title Pt will complete moderately coomplex executive function tasks with 90% accuracy and rare min A   Time 3   Period Weeks   Status On-going   SLP LONG TERM GOAL #3   Title Pt will verbalize 3 compensations for reduced attention with rare min A   Time 3   Period Weeks   Status On-going   SLP LONG TERM GOAL #4   Title Pt will self correct detailed linguistic  tasks  (auditory, written, spoken) to produce work with 100% accuracy overall   Time 3   Period Weeks   Status New          Plan - 08/21/15 1011    Clinical Impression Statement Pt met STG for simple executive function, but did not for detailed cognitive lingustiic tasks. Continue skilled ST to maximize divided attention for eventual return to work"   Speech Therapy Frequency 2x / week   Duration --  3 weeks   Treatment/Interventions Compensatory techniques;Internal/external aids;SLP instruction and feedback;Cognitive reorganization;Functional tasks;Patient/family education   Potential to Achieve Goals Good   Potential Considerations Ability to learn/carryover information;Severity of impairments        Problem List Patient Active Problem List   Diagnosis Date Noted  . Cerebral  infarction due to thrombosis of left middle cerebral artery ([place])   . Stroke ([place]) 06/19/2015  . Essential hypertension 06/19/2015  . Hypokalemia 06/19/2015  . Tobacco abuse 06/19/2015    [facility] , [place], [place]  08/21/2015, 10:15 AM  [facility] [address] [place], [place], [address] Phone: [phone]   Fax:  [phone]   Name: Catherine Floyd MRN: [id number] Date of Birth: [birth year]

## 2015-08-21 NOTE — Therapy (Signed)
[facility] [address] [place], [place], [address] Phone: [phone]   Fax:  [phone]  Occupational Therapy Treatment  Patient Details  Name: [name] MRN: [id number] Date of Birth: Jan 18, 1951 No Data Recorded  Encounter Date: 08/21/2015      OT End of Session - 08/21/15 1027    Visit Number 11   Number of Visits 17   Date for OT Re-Evaluation 09/10/15   Authorization Type BCBS primary (20 visit limit),  Medicare secondary (g-code)   Authorization - Visit Number 11   Authorization - Number of Visits 20   OT Start Time 1019   OT Stop Time 1100   OT Time Calculation (min) 41 min   Activity Tolerance Patient tolerated treatment well   Behavior [facility] for tasks assessed/performed      Past Medical History  Diagnosis Date  . Hypertension   . Cigarette nicotine dependence     Past Surgical History  Procedure Laterality Date  . Abdominal hysterectomy    . Appendectomy    . Tubal ligation      There were no vitals filed for this visit.  Visit Diagnosis:  Weakness due to cerebrovascular accident  Impaired sensation  Unsteadiness  Decreased coordination  Visuospatial deficit  Cognitive deficits following cerebral infarction      Subjective Assessment - 08/21/15 1025    Subjective  Pt reports that she is using RUE for all tasks, but is dropping items    Pertinent History see Epic   Patient Stated Goals to use RUE   Currently in Pain? No/denies                      OT Treatments/Exercises (OP) - 08/21/15 0001    Visual/Perceptual Exercises   Letter Search with 100% accuracy to locate #'s in order for cognitive component.  Pt with min incr time.  Pt reports performing word searches at home with difficulty.   Other Exercises Completed a 24-piece puzzle for tabletop visual scanning/visual perceptual skills with mod difficulty/incr time.  Pt given min cueing  to actually seperate all edge pieces first due to difficulty and demo incr ease once completed.  Encouraged pt to try this at home.     Functional Reaching Activities   High Level functional reaching in standing with RUE to place small pegs in vertical pegboard for incr balance (incorporating wt. shift), incr coordination, and RUE activity tolerance, and visual perceptual skills.  Pt copied design with 100% accuracy and 1 drop/no rest breaks, although reported fatigue at end of task.                  OT Short Term Goals - 08/18/15 0939    OT SHORT TERM GOAL #1   Title I with HEP.   Baseline Pt returns demonstration of red theraband   Status Achieved   OT SHORT TERM GOAL #2   Title Pt will demonstrate ability to perform basic cooking task modified independently demonstrating good safety awareness.   Status Achieved   OT SHORT TERM GOAL #3   Title Pt will improve RUE fine motor coordiantion as evidenced by decreasing  9 hole peg test score to 30 secs without drops.   Baseline 29.31 secs   Status Achieved           OT Long Term Goals - 08/18/15 0940    OT LONG TERM GOAL #1   Title Pt will demonstrate ability  to perform mod complex cooking( 2 items simultaneously) modified independently demonstrating good safety awareness.   Status On-going   OT LONG TERM GOAL #2   Title Pt will perform simulated work activities at a modified independent level including placing items that ar 5 lbs on over head shelf with RUE without drops.   Status On-going   OT LONG TERM GOAL #3   Title Pt will demonstrate ability to carry 20 lbs, 25 ft x 3 without rest break.   Status On-going   OT LONG TERM GOAL #4   Title Pt will demonstrate improved visual perceptual skills as evidenced by copying a simple design with 95% or better accuracy.   Status On-going   OT LONG TERM GOAL #5   Title Pt will use RUE as her dominant hand at least 90% of the time for ADLs/ Functional activities.   Status On-going                Plan - 08/21/15 1112    Clinical Impression Statement Pt appears to demo difficulty with disorganized visual scanning, but is able to perform organized/ordered scanning (line by line) with good accuracy.  Pt demo improved coordination and activity tolerance.   Plan simulated work tasks, Buyer, retail   OT Home Exercise Plan issued : coordination 07/18/15, theraband 07/20/15 red, upgraded to green on 08/15/15   Consulted and Agree with Plan of Care Patient        Problem List Patient Active Problem List   Diagnosis Date Noted  . 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 Stroke ([facility]) 06/19/2015  . Essential hypertension 06/19/2015  . Hypokalemia 06/19/2015  . Tobacco abuse 06/19/2015    [facility] 08/21/2015, 11:17 AM  [facility] [address] [place], [place], [address] Phone: [phone]   Fax:  [phone]  Name: Catherine Floyd MRN: [id number] Date of Birth: December 09, 1950  Willa Frater, OTR/L 08/21/2015 11:17 AM

## 2015-08-21 NOTE — Patient Instructions (Addendum)
Gaze Stabilization: Standing Feet Apart   Feet shoulder width apart, keeping eyes on target on wall __6__ feet away, tilt head down 15-30 and move head side to side for 30 seconds. Do 3-4 sessions per day with glasses on. * If your lightheadness or feeling of being "off" increases by more than 3 points out of 10, stop exercise and slowly walk until it returns to where you started.   Gaze Stabilization: Tip Card 1.Target must remain in focus, not blurry, and appear stationary while head is in motion. 2.Perform exercises with small head movements (45 to either side of midline). 3.Increase speed of head motion so long as target is in focus. 4.If you wear eyeglasses, be sure you can see target through lens (therapist will give specific instructions for bifocal / progressive lenses). 5.These exercises may provoke dizziness or nausea. Work through these symptoms. If too dizzy, slow head movement slightly. Rest between each exercise. 6.Exercises demand concentration; avoid distractions. 7.For safety, perform standing exercises close to a counter, wall, corner, or next to someone.  Feet Apart (Compliant Surface) Head Motion - Eyes Closed   Stand with your back to a corner with a stable chair in front of you. With eyes closed, standing on a pillow; feet shoulder width apart, move head slowly: right to left 10 times; up to down 10 times. If your lightheadness or feeling of being "off" increases by more than 3 points out of 10, stop exercise and slowly walk until it returns to where you started.  Do _2___ sessions per day. Make sure you do these in corner with chair in front of you for safety in case of LOB.     Throwing / Catching / Bouncing Ball: Compliant Surface   On pillow with feet apart, stand in corner with chair in front of you and toss ball to yourself. Make sure that your eyes follow the ball up then down with each toss. Repeat _10___ times per session. Do __2__ sessions per  day.   Compensatory Strategies: Corrective Saccades   *Do this exercise in seated.  1. Tape your two "A"'s on the wall about __6__ inches apart at eye-level, move eyes to target, keep head still. 2. Then move head in direction of target while eyes remain on target. 3/4. Repeat in opposite direction. Perform sitting. Repeat sequence __20__ times per session. Do __2__ sessions per day.   Tandem Walking    Stand beside your kitchen countertop with one hand on the counter. Walk with each foot directly in front of other, heel of one foot touching toes of other foot with each step. Both feet straight ahead. Perform 5 laps (down and back) in this way. Do this twice per day.   Fall Prevention in the Home  Falls can cause injuries and can affect people from all age groups. There are many simple things that you can do to make your home safe and to help prevent falls. WHAT CAN I DO ON THE OUTSIDE OF MY HOME?  Regularly repair the edges of walkways and driveways and fix any cracks.  Remove high doorway thresholds.  Trim any shrubbery on the main path into your home.  Use bright outdoor lighting.  Clear walkways of debris and clutter, including tools and rocks.  Regularly check that handrails are securely fastened and in good repair. Both sides of any steps should have handrails.  Install guardrails along the edges of any raised decks or porches.  Have leaves, snow, and ice cleared regularly.  Use sand or salt on walkways during winter months.  In the garage, clean up any spills right away, including grease or oil spills. WHAT CAN I DO IN THE BATHROOM?  Use night lights.  Install grab bars by the toilet and in the tub and shower. Do not use towel bars as grab bars.  Use non-skid mats or decals on the floor of the tub or shower.  If you need to sit down while you are in the shower, use a plastic, non-slip stool.Marland Kitchen.  Keep the floor dry. Immediately clean up any water that spills  on the floor.  Remove soap buildup in the tub or shower on a regular basis.  Attach bath mats securely with double-sided non-slip rug tape.  Remove throw rugs and other tripping hazards from the floor. WHAT CAN I DO IN THE BEDROOM?  Use night lights.  Make sure that a bedside light is easy to reach.  Do not use oversized bedding that drapes onto the floor.  Have a firm chair that has side arms to use for getting dressed.  Remove throw rugs and other tripping hazards from the floor. WHAT CAN I DO IN THE KITCHEN?   Clean up any spills right away.  Avoid walking on wet floors.  Place frequently used items in easy-to-reach places.  If you need to reach for something above you, use a sturdy step stool that has a grab bar.  Keep electrical cables out of the way.  Do not use floor polish or wax that makes floors slippery. If you have to use wax, make sure that it is non-skid floor wax.  Remove throw rugs and other tripping hazards from the floor. WHAT CAN I DO IN THE STAIRWAYS?  Do not leave any items on the stairs.  Make sure that there are handrails on both sides of the stairs. Fix handrails that are broken or loose. Make sure that handrails are as long as the stairways.  Check any carpeting to make sure that it is firmly attached to the stairs. Fix any carpet that is loose or worn.  Avoid having throw rugs at the top or bottom of stairways, or secure the rugs with carpet tape to prevent them from moving.  Make sure that you have a light switch at the top of the stairs and the bottom of the stairs. If you do not have them, have them installed. WHAT ARE SOME OTHER FALL PREVENTION TIPS?  Wear closed-toe shoes that fit well and support your feet. Wear shoes that have rubber soles or low heels.  When you use a stepladder, make sure that it is completely opened and that the sides are firmly locked. Have someone hold the ladder while you are using it. Do not climb a closed  stepladder.  Add color or contrast paint or tape to grab bars and handrails in your home. Place contrasting color strips on the first and last steps.  Use mobility aids as needed, such as canes, walkers, scooters, and crutches.  Turn on lights if it is dark. Replace any light bulbs that burn out.  Set up furniture so that there are clear paths. Keep the furniture in the same spot.  Fix any uneven floor surfaces.  Choose a carpet design that does not hide the edge of steps of a stairway.  Be aware of any and all pets.  Review your medicines with your healthcare provider. Some medicines can cause dizziness or changes in blood pressure, which increase your risk  of falling. Talk with your health care provider about other ways that you can decrease your risk of falls. This may include working with a physical therapist or trainer to improve your strength, balance, and endurance.   This information is not intended to replace advice given to you by your health care provider. Make sure you discuss any questions you have with your health care provider.   Document Released: 10/11/2002 Document Revised: 03/07/2015 Document Reviewed: 11/25/2014 Elsevier Interactive Patient Education [employer].

## 2015-08-23 ENCOUNTER — Ambulatory Visit: Payer: BLUE CROSS/BLUE SHIELD | Admitting: Rehabilitation

## 2015-08-23 ENCOUNTER — Ambulatory Visit: Payer: BLUE CROSS/BLUE SHIELD | Admitting: Occupational Therapy

## 2015-08-23 ENCOUNTER — Ambulatory Visit: Payer: BLUE CROSS/BLUE SHIELD

## 2015-08-23 ENCOUNTER — Encounter: Payer: Self-pay | Admitting: Rehabilitation

## 2015-08-23 DIAGNOSIS — R278 Other lack of coordination: Secondary | ICD-10-CM

## 2015-08-23 DIAGNOSIS — IMO0002 Reserved for concepts with insufficient information to code with codable children: Secondary | ICD-10-CM

## 2015-08-23 DIAGNOSIS — R279 Unspecified lack of coordination: Secondary | ICD-10-CM

## 2015-08-23 DIAGNOSIS — I69319 Unspecified symptoms and signs involving cognitive functions following cerebral infarction: Secondary | ICD-10-CM

## 2015-08-23 DIAGNOSIS — R201 Hypoesthesia of skin: Secondary | ICD-10-CM

## 2015-08-23 DIAGNOSIS — R269 Unspecified abnormalities of gait and mobility: Secondary | ICD-10-CM | POA: Diagnosis not present

## 2015-08-23 DIAGNOSIS — R42 Dizziness and giddiness: Secondary | ICD-10-CM

## 2015-08-23 DIAGNOSIS — R2681 Unsteadiness on feet: Secondary | ICD-10-CM

## 2015-08-23 DIAGNOSIS — R41842 Visuospatial deficit: Secondary | ICD-10-CM

## 2015-08-23 NOTE — Patient Instructions (Signed)
It might be helpful to have a family member or a good friend to look over your detailed tasks at home (bill paying) for a few cycles.

## 2015-08-23 NOTE — Patient Instructions (Addendum)
Mini Squat: Double Leg    Stand with chair behind you.  Start with feet a little wider than shoulder width.  You are going to pretend you are sitting to chair, arms will reach out in front of you.  Bend knees and then come back into standing.  Make sure that your knees are not going over toes, your weight goes back towards your heels and that you stick you bottom out (like butt is reaching for chair).   Perform x 10 reps, try to do twice a day.    http://plyo.exer.us/70   Copyright  VHI. All rights reserved.     Gaze Stabilization: Standing Feet Apart   Feet shoulder width apart, keeping eyes on target on wall __6__ feet away, tilt head down 15-30 and move head side to side for 30 seconds. Do 3-4 sessions per day with glasses on. * If your lightheadness or feeling of being "off" increases by more than 3 points out of 10, stop exercise and slowly walk until it returns to where you started.   Gaze Stabilization: Tip Card 1.Target must remain in focus, not blurry, and appear stationary while head is in motion. 2.Perform exercises with small head movements (45 to either side of midline). 3.Increase speed of head motion so long as target is in focus. 4.If you wear eyeglasses, be sure you can see target through lens (therapist will give specific instructions for bifocal / progressive lenses). 5.These exercises may provoke dizziness or nausea. Work through these symptoms. If too dizzy, slow head movement slightly. Rest between each exercise. 6.Exercises demand concentration; avoid distractions. 7.For safety, perform standing exercises close to a counter, wall, corner, or next to someone.  Feet Apart (Compliant Surface) Head Motion - Eyes Closed   Stand with your back to a corner with a stable chair in front of you. With eyes closed, standing on a pillow; feet shoulder width apart, move head slowly: right to left 10 times; up to down 10 times. If your lightheadness or feeling of being  "off" increases by more than 3 points out of 10, stop exercise and slowly walk until it returns to where you started.  Do _2___ sessions per day. Make sure you do these in corner with chair in front of you for safety in case of LOB.

## 2015-08-23 NOTE — Therapy (Signed)
[facility] [address] [place], [place], [address] Phone: [phone]   Fax:  [phone]  Occupational Therapy Treatment  Patient Details  Name: Catherine Floyd MRN: [id number] Date of Birth: Jan 03, 1951 No Data Recorded  Encounter Date: 08/23/2015      OT End of Session - 08/23/15 1725    Visit Number 12   Number of Visits 17   Date for OT Re-Evaluation 09/10/15   Authorization Type [employer] primary (20 visit limit),  Medicare secondary (g-code)   Authorization - Visit Number 12   Authorization - Number of Visits 20   OT Start Time 1020   OT Stop Time 1100   OT Time Calculation (min) 40 min      Past Medical History  Diagnosis Date  . Hypertension   . Cigarette nicotine dependence     Past Surgical History  Procedure Laterality Date  . Abdominal hysterectomy    . Appendectomy    . Tubal ligation      There were no vitals filed for this visit.  Visit Diagnosis:  Weakness due to cerebrovascular accident  Impaired sensation  Unsteadiness  Decreased coordination  Visuospatial deficit  Cognitive deficits following cerebral infarction      Subjective Assessment - 08/23/15 1006    Pertinent History see Epic   Patient Stated Goals to use RUE   Currently in Pain? No/denies      Treatment: mod complex cooking task, to make noodle mix and grilled cheese sandwich.Pt required mod v.c. For safety and time of tasks. Pt demonstrated 2 LOB yet pt was able to self recover. Therapist made recommendations that pt use linger utensils and avoid getting right hand close to pan due to sensory impairment. Arm bike x 5 mins level 6 for conditioning.                          OT Short Term Goals - 08/18/15 0939    OT SHORT TERM GOAL #1   Title I with HEP.   Baseline Pt returns demonstration of red theraband   Status Achieved   OT SHORT TERM GOAL #2   Title Pt will demonstrate ability  to perform basic cooking task modified independently demonstrating good safety awareness.   Status Achieved   OT SHORT TERM GOAL #3   Title Pt will improve RUE fine motor coordiantion as evidenced by decreasing  9 hole peg test score to 30 secs without drops.   Baseline 29.31 secs   Status Achieved           OT Long Term Goals - 08/18/15 0940    OT LONG TERM GOAL #1   Title Pt will demonstrate ability to perform mod complex cooking( 2 items simultaneously) modified independently demonstrating good safety awareness.   Status On-going   OT LONG TERM GOAL #2   Title Pt will perform simulated work activities at a modified independent level including placing items that ar 5 lbs on over head shelf with RUE without drops.   Status On-going   OT LONG TERM GOAL #3   Title Pt will demonstrate ability to carry 20 lbs, 25 ft x 3 without rest break.   Status On-going   OT LONG TERM GOAL #4   Title Pt will demonstrate improved visual perceptual skills as evidenced by copying a simple design with 95% or better accuracy.   Status On-going   OT LONG TERM GOAL #5   Title  Pt will use RUE as her dominant hand at least 90% of the time for ADLs/ Functional activities.   Status On-going               Plan - 08/23/15 1723    Clinical Impression Statement Pt continues to demonstrate difficulty with alternating attention in functional activities.   Pt will benefit from skilled therapeutic intervention in order to improve on the following deficits (Retired) Decreased coordination;Decreased range of motion;Difficulty walking;Decreased endurance;Decreased safety awareness;Impaired sensation;Decreased knowledge of precautions;Decreased activity tolerance;Decreased balance;Decreased knowledge of use of DME;Impaired UE functional use;Impaired vision/preception;Impaired perceived functional ability;Decreased strength;Decreased mobility;Decreased cognition   Rehab Potential Good   OT Frequency 2x / week    OT Duration 8 weeks   OT Treatment/Interventions Self-care/ADL training;Therapeutic exercise;Patient/family education;Functional Mobility Training;Balance training;Manual Therapy;Neuromuscular education;Ultrasound;Energy conservation;Therapeutic exercises;Therapeutic activities;DME and/or AE instruction;Parrafin;Cryotherapy;Electrical Stimulation;Fluidtherapy;Cognitive remediation/compensation;Visual/perceptual remediation/compensation;Passive range of motion;Contrast Bath;Moist Heat   Plan divided /alternating attention   OT Home Exercise Plan issued : coordination 07/18/15, theraband 07/20/15 red, upgraded to green on 08/15/15   Consulted and Agree with Plan of Care Patient        Problem List Patient Active Problem List   Diagnosis Date Noted  . 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 Stroke (HCC) 06/19/2015  . Essential hypertension 06/19/2015  . Hypokalemia 06/19/2015  . Tobacco abuse 06/19/2015    , 08/23/2015, 5:26 PM Keene BreathKathryn , OTR/L [phone] Phone: [phone] 5:26 PM 08/23/2015 [facility] [address] [place], [place], [address] Phone: [phone]   Fax:  [phone]  Name: Catherine Floyd MRN: [id number] Date of Birth: Feb 06, 1951

## 2015-08-23 NOTE — Therapy (Signed)
[facility] [address] [place], [place], [address] Phone: [phone]   Fax:  [phone]  Speech Language Pathology Treatment  Patient Details  Name: Catherine Floyd MRN: [id number] Date of Birth: 12-18-1950 No Data Recorded  Encounter Date: 08/23/2015      End of Session - 08/23/15 1115    Visit Number 10   Number of Visits 16   Date for SLP Re-Evaluation 09/12/15   SLP Start Time 0933   SLP Stop Time  0737   SLP Time Calculation (min) 41 min   Activity Tolerance Patient tolerated treatment well      Past Medical History  Diagnosis Date  . Hypertension   . Cigarette nicotine dependence     Past Surgical History  Procedure Laterality Date  . Abdominal hysterectomy    . Appendectomy    . Tubal ligation      There were no vitals filed for this visit.  Visit Diagnosis: Cognitive deficits following cerebral infarction      Subjective Assessment - 08/23/15 0932    Subjective Pt recalls SLP/she talking about detailed information she does may require someone else to check it.               ADULT SLP TREATMENT - 08/23/15 0932    General Information   Behavior/Cognition Cooperative;Alert;Pleasant mood   Treatment Provided   Treatment provided Cognitive-Linquistic   Pain Assessment   Pain Assessment No/denies pain   Cognitive-Linquistic Treatment   Treatment focused on Cognition   Skilled Treatment SLP assessed pt's pre-planning about going back to work. SLP suggested pt taslk with fellow employees and/or supervisor about any changes since she has worked via telephone or face to face. She agreed that would be helpful for her, and would do do so by next session. In detailed tasks today pt self corrected intermittently. Her homework was corrected with 90% success. SLP and pt talked about compensations for reduced emergent awareness in detailed tasks, and SLP suggested pt have family/friend look  at detailed tasks (e.g., bill pay) for a few cycles until pt can ensure performance 100%. Detailed task provided for homework.   Assessment / Recommendations / Plan   Plan Continue with current plan of care   Progression Toward Goals   Progression toward goals Progressing toward goals          SLP Education - 08/23/15 1115    Education provided Yes   Education Details compensations for reduced emergent awareness   Person(s) Educated Patient   Methods Explanation   Comprehension Verbalized understanding          SLP Short Term Goals - 08/21/15 0942    SLP SHORT TERM GOAL #1   Title Pt will alternate attention between 2 cognitive linguistic activities with 85% on each and rare min A   Time 1   Period Weeks   Status Achieved   SLP SHORT TERM GOAL #2   Title Pt will attend details in moderately complex cogntive linguistic tasks with occasional min A and 85% accuracy   Status Not Met   SLP SHORT TERM GOAL #3   Title Pt will complete mildly complex executive function tasks (planning, organizing, analyzing) with 85% accuracy and rare min A   Status Achieved  08-18-15          SLP Long Term Goals - 08/23/15 1117    SLP LONG TERM GOAL #1   Title Pt will divide attention between 2 simple cognitive attention  tasks with rare min A and 90% on each task.   Time 3   Period Weeks   Status On-going   SLP LONG TERM GOAL #2   Title Pt will complete moderately coomplex executive function tasks with 90% accuracy and rare min A   Time 3   Period Weeks   Status On-going   SLP LONG TERM GOAL #3   Title Pt will verbalize 3 compensations for reduced attention with rare min A   Time 3   Period Weeks   Status On-going   SLP LONG TERM GOAL #4   Title Pt will self correct detailed linguistic tasks  (auditory, written, spoken) to produce work with 100% accuracy overall   Time 3   Period Weeks   Status New          Plan - 08/23/15 1116    Clinical Impression Statement Pt is thinking  about how to return to work with the most planning she can have prior to her date of return. Skilled ST cont to maximize cognitive linguistic skills to maximize possiblity to return to work.   Speech Therapy Frequency 2x / week   Duration --  3 weeks   Treatment/Interventions Compensatory techniques;Internal/external aids;SLP instruction and feedback;Cognitive reorganization;Functional tasks;Patient/family education   Potential to Achieve Goals Good   Potential Considerations Ability to learn/carryover information;Severity of impairments        Problem List Patient Active Problem List   Diagnosis Date Noted  . Cerebral infarction due to thrombosis of left middle cerebral artery ([place])   . Stroke ([place]) 06/19/2015  . Essential hypertension 06/19/2015  . Hypokalemia 06/19/2015  . Tobacco abuse 06/19/2015    [facility] , MS, [place]  08/23/2015, 11:18 AM  [facility] [address] [place], [place], [address] Phone: [phone]   Fax:  [phone]   Name: Catherine Floyd MRN: [id number] Date of Birth: 1951/03/26

## 2015-08-23 NOTE — Therapy (Signed)
[facility] [address] [place], [place], [address] Phone: [phone]   Fax:  [phone]  Physical Therapy Treatment  Patient Details  Name: Catherine Floyd MRN: [id number] Date of Birth: 01/04/1951 No Data Recorded  Encounter Date: 08/23/2015      PT End of Session - 08/23/15 1121    Visit Number 9   Number of Visits 17   Date for PT Re-Evaluation 09/11/15   Authorization Type Medicare - G Codes every 10 vists   PT Start Time 0845   PT Stop Time 0930   PT Time Calculation (min) 45 min   Activity Tolerance Patient tolerated treatment well   Behavior During Therapy [facility] for tasks assessed/performed      Past Medical History  Diagnosis Date  . Hypertension   . Cigarette nicotine dependence     Past Surgical History  Procedure Laterality Date  . Abdominal hysterectomy    . Appendectomy    . Tubal ligation      There were no vitals filed for this visit.  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 No/denies               NMR:  Continue to address high level vestibular and visual challenges with corner balance tasks, see pt instruction.  Performed all x 2 sets of 10 reps.  Progressed to performing side to side head movements with alternating arms movements x 10 reps each side x 2 sets.  Cues for decreased trunk rotation and head movement only.  Continues to demonstrate moderate amount of sway during tasks and recommend she continue these at home.    Therex:  Performed work simulated  tasks involving picking up moderately heavy box from floor and turning and placing on higher "shelf"  (somewhat above waist level).  Weighted box with approx 7 lbs of weight to simulate soap box at work.  Provided verbal and demonstration cues on body mechanics when picking up objects from floor and placing onto shelf.  Requires mod cues throughout performing x 10 reps for body mechanics, slowing down in order to ensure proper body mechanics and not locking out knees when bringing object back to waist.  Note she has most difficulty when picking back up from floor.  Then provided pt with squats with chair behind her for safety and also for visual target.  See pt instruction, performed x 10 reps.  Again, min cues for ensuring weight shift back towards heels to avoid stress at knee.                   PT Education - 08/23/15 1120    Education provided Yes   Education Details Education on compensatios for vestibular deficits, continuing to perform adaptation exercises, and proper body mechanics in relation to work related activity.    Person(s) Educated Patient   Methods Explanation;Demonstration   Comprehension Verbalized understanding;Returned demonstration          PT Short Term Goals - 08/10/15 1833    PT SHORT TERM  GOAL #1   Title Pt will perform initial home exercise program with mod I using paper handout to indicate safe HEP compliance, to maximize functional gains. Target date: 08/10/15.   Baseline Met 10/6.   Status Achieved   PT SHORT TERM GOAL #2   Title Pt will increase self-selected gait speed from 3.09 ft/sec to 3.39 ft/sec to indicate increased efficiency of ambulation. Target date: 08/10/15.   Baseline 10/4:  self-selected gait speed = 3.07 ft/sec   Status Not Met   PT SHORT TERM GOAL #3   Title Pt will increase FGA score 15/30 to 19/30 to indicate improved dynamic gait stability. Target date: 08/10/15.   Baseline 10/4: FGA score = 20/30   Status Achieved   PT SHORT  TERM GOAL #4   Title Pt will independently negotiate standard ramp and curb step to indicate pt safety traversing community obstacles. Target date: 08/10/15.   Status Achieved           PT Long Term Goals - 08/21/15 1110    PT LONG TERM GOAL #1   Title Pt will verbalize understanding of fall prevention strategies to decrease risk of falling in home environment. Target date: 09/07/15.   Baseline Discussed and provided paper handout on 10/17.   PT LONG TERM GOAL #2   Title Patient will increase FGA score from 15 to 23/30 to indicate decreased risk of falling.  Target date: 09/07/15.   PT LONG TERM GOAL #3   Title Pt will independently ambulate 1,000' over unlevel paved and grass surfaces with no overt LOB to indicate increased safety with community mobility. Target date: 09/07/15.   PT LONG TERM GOAL #4   Title Pt will verbalize understanding of fall prevention strategies to decrease risk of falling in home environment.  Target date: 09/07/15.   PT LONG TERM GOAL #5   Title Pt will verbalize understanding of CVA warning signs, pertinent risk factors to prevent future CVA. Target date: 09/07/15.   Baseline 10/11: Pt able to verbalize risk factors without cueing; requires 50% cueing for warning signs.   Status Partially Met   PT LONG TERM GOAL #6   Title Pt will increase Activities-Specific Balance Confidence Scale score from 46.3% to >/= 57% to indicate increased balance confidence. Target date: 09/07/15.               Plan - 08/23/15 1121    Clinical Impression Statement Skilled session focused on work related activities with proper body mechanics related to squats.  Pt continues to need cues for proper body mechanics throughout lifting tasks, but added squats to HEP in order to continue to address, see pt instruction for details.  Also continue to address adaptation for vestbular deficits via corner exercises, also see pt instruction for details.  Tolerated well.  Continue POC.    Pt  will benefit from skilled therapeutic intervention in order to improve on the following deficits Abnormal gait;Decreased activity tolerance;Decreased balance;Decreased endurance;Decreased coordination;Decreased safety awareness;Dizziness;Impaired sensation;Decreased cognition;Decreased strength   Rehab Potential Excellent   PT Frequency 2x / week   PT Duration 8 weeks   PT Treatment/Interventions ADLs/Self Care Home Management;DME Instruction;Gait training;Stair training;Functional mobility training;Therapeutic activities;Therapeutic exercise;Balance training;Patient/family education;Cognitive remediation;Neuromuscular re-education;Manual techniques;Vestibular   PT Next Visit Plan Continue to simulate work/functional activitiies (as pt has difficulty squatting and gets dizzy/off-balance with bending over at waist).   Consulted and Agree with Plan of Care Patient        Problem List Patient Active Problem List  Diagnosis Date Noted  . Cerebral infarction due to thrombosis of left middle cerebral artery ([place])   . Stroke ([place]) 06/19/2015  . Essential hypertension 06/19/2015  . Hypokalemia 06/19/2015  . Tobacco abuse 06/19/2015   Cameron Sprang, PT, MPT [facility] [address] [place], [place], [address] Phone: [phone]   Fax:  [phone] 08/23/2015, 11:25 AM  Name: Catherine Floyd MRN: [id number] Date of Birth: 09-29-1951

## 2015-08-28 ENCOUNTER — Encounter: Payer: Self-pay | Admitting: Rehabilitation

## 2015-08-28 ENCOUNTER — Ambulatory Visit: Payer: BLUE CROSS/BLUE SHIELD | Admitting: Occupational Therapy

## 2015-08-28 ENCOUNTER — Ambulatory Visit: Payer: BLUE CROSS/BLUE SHIELD | Admitting: Rehabilitation

## 2015-08-28 DIAGNOSIS — R41842 Visuospatial deficit: Secondary | ICD-10-CM

## 2015-08-28 DIAGNOSIS — R269 Unspecified abnormalities of gait and mobility: Secondary | ICD-10-CM

## 2015-08-28 DIAGNOSIS — I69319 Unspecified symptoms and signs involving cognitive functions following cerebral infarction: Secondary | ICD-10-CM

## 2015-08-28 DIAGNOSIS — R279 Unspecified lack of coordination: Secondary | ICD-10-CM

## 2015-08-28 DIAGNOSIS — R278 Other lack of coordination: Secondary | ICD-10-CM

## 2015-08-28 DIAGNOSIS — R42 Dizziness and giddiness: Secondary | ICD-10-CM

## 2015-08-28 DIAGNOSIS — IMO0002 Reserved for concepts with insufficient information to code with codable children: Secondary | ICD-10-CM

## 2015-08-28 DIAGNOSIS — R2681 Unsteadiness on feet: Secondary | ICD-10-CM

## 2015-08-28 NOTE — Patient Instructions (Signed)
Laundry NordstromBasket    Keep feet apart. Squat down and hold basket close to stand. Use leg muscles to do the work.  Remember to do the same when lowering object/item to the ground, feet apart, squat down and keep item close to you.     Copyright  VHI. All rights reserved.

## 2015-08-28 NOTE — Therapy (Addendum)
[facility] [address] [place], [place], [address] Phone: [phone]   Fax:  [phone]  Physical Therapy Treatment  Patient Details  Name: Catherine Floyd MRN: [id number] Date of Birth: 1950/12/18 No Data Recorded  Encounter Date: 08/28/2015      PT End of Session - 08/28/15 1508    Visit Number 10   Number of Visits 17   Date for PT Re-Evaluation 09/11/15   Authorization Type Medicare - G Codes every 10 vists   PT Start Time 1315   PT Stop Time 1400   PT Time Calculation (min) 45 min   Activity Tolerance Patient tolerated treatment well   Behavior During Therapy [facility] for tasks assessed/performed      Past Medical History  Diagnosis Date  . Hypertension   . Cigarette nicotine dependence     Past Surgical History  Procedure Laterality Date  . Abdominal hysterectomy    . Appendectomy    . Tubal ligation      There were no vitals filed for this visit.  Visit Diagnosis:  Abnormality of gait  Unsteadiness  Dizziness and giddiness      Subjective Assessment - 08/28/15 1319    Subjective Reports no changes from last visit, no falls.    Pertinent History Cerebral infarction due to thrombosis of left middle cerebral artery (06/19/15), hypokalemia, HTN, tobacco abuse   Limitations Walking   Patient Stated Goals "I want my balance back."   Currently in Pain? No/denies               NMR:  Continue to address high level balance with gait with vestibular adaptation with gait while tossing ball way overhead and following with head/eyes, next bouncing ball while keeping eyes on ball, both x 115' each with no rest in between.  Note that following two laps, pt requires seated rest break due to increased fatigue.  Progressed to ambulation with ball toss to/from therapist from varying positions to further challenge head/eye movement x 115'.  Ended with gait while holding ball in both hands while  moving in circular pattern following ball with head and eyes with cues for increased ROM and increased head movement.  Continue to note that with vertical head movements and circular movements, she tends to scissor feet. Ended high level balance with gait outdoors on grassy surface with head turns up/down and side/side for increased challenge.  Again, no overt LOB but does tend to stagger to the R.   TA:  Continue to simulate work related activities by having pt climb step ladder while carrying objects, placing up on taller shelves and then retrieving items from shelf and bringing back to floor.  Education and cues for having single UE support and ways to utilize other shelves to assist rather than trying to lift whole box up step ladder.  Progressed to continuing to address body mechanics with lifting objects from floor to waist and above waist level.  Pt continues to require mod to max verbal cues as well as demonstration for proper technique, therefore provided pt with handout, see pt instructions.                   PT Education - 08/28/15 1356    Education provided Yes   Education Details Economist and work related activities   Person(s) Educated Patient   Methods Explanation   Comprehension Verbalized understanding          PT Short Term Goals -  08/10/15 1833    PT SHORT TERM GOAL #1   Title Pt will perform initial home exercise program with mod I using paper handout to indicate safe HEP compliance, to maximize functional gains. Target date: 08/10/15.   Baseline Met 10/6.   Status Achieved   PT SHORT TERM GOAL #2   Title Pt will increase self-selected gait speed from 3.09 ft/sec to 3.39 ft/sec to indicate increased efficiency of ambulation. Target date: 08/10/15.   Baseline 10/4:  self-selected gait speed = 3.07 ft/sec   Status Not Met   PT SHORT TERM GOAL #3   Title Pt will increase FGA score 15/30 to 19/30 to indicate improved dynamic gait stability. Target date:  08/10/15.   Baseline 10/4: FGA score = 20/30   Status Achieved   PT SHORT TERM GOAL #4   Title Pt will independently negotiate standard ramp and curb step to indicate pt safety traversing community obstacles. Target date: 08/10/15.   Status Achieved           PT Long Term Goals - 08/21/15 1110    PT LONG TERM GOAL #1   Title Pt will verbalize understanding of fall prevention strategies to decrease risk of falling in home environment. Target date: 09/07/15.   Baseline Discussed and provided paper handout on 10/17.   PT LONG TERM GOAL #2   Title Patient will increase FGA score from 15 to 23/30 to indicate decreased risk of falling.  Target date: 09/07/15.   PT LONG TERM GOAL #3   Title Pt will independently ambulate 1,000' over unlevel paved and grass surfaces with no overt LOB to indicate increased safety with community mobility. Target date: 09/07/15.   PT LONG TERM GOAL #4   Title Pt will verbalize understanding of fall prevention strategies to decrease risk of falling in home environment.  Target date: 09/07/15.   PT LONG TERM GOAL #5   Title Pt will verbalize understanding of CVA warning signs, pertinent risk factors to prevent future CVA. Target date: 09/07/15.   Baseline 10/11: Pt able to verbalize risk factors without cueing; requires 50% cueing for warning signs.   Status Partially Met   PT LONG TERM GOAL #6   Title Pt will increase Activities-Specific Balance Confidence Scale score from 46.3% to >/= 57% to indicate increased balance confidence. Target date: 09/07/15.               Plan - 09/14/15 1513    Clinical Impression Statement Skilled sessions continue to focus on high level balance and gait with vestibular adaptations as well as work simulated tasks to better prepare pt for returning to work.  Pt continues to demonstrate scissoring during high level tasks however does not have any overt LOB and is able to regain balance on her own.    Pt will benefit from skilled  therapeutic intervention in order to improve on the following deficits Abnormal gait;Decreased activity tolerance;Decreased balance;Decreased endurance;Decreased coordination;Decreased safety awareness;Dizziness;Impaired sensation;Decreased cognition;Decreased strength   Rehab Potential Excellent   PT Frequency 2x / week   PT Duration 8 weeks   PT Treatment/Interventions ADLs/Self Care Home Management;DME Instruction;Gait training;Stair training;Functional mobility training;Therapeutic activities;Therapeutic exercise;Balance training;Patient/family education;Cognitive remediation;Neuromuscular re-education;Manual techniques;Vestibular   PT Next Visit Plan Continue to simulate work/functional activitiies (as pt has difficulty squatting and gets dizzy/off-balance with bending over at waist).   Consulted and Agree with Plan of Care Patient          G-Codes - September 14, 2015 1510    Functional Assessment Tool  Used FGA 20/30   Functional Limitation Mobility: Walking and moving around   Mobility: Walking and Moving Around Current Status [phone]) At least 20 percent but less than 40 percent impaired, limited or restricted   Mobility: Walking and Moving Around Goal Status [phone]) At least 1 percent but less than 20 percent impaired, limited or restricted      Physical Therapy Progress Note  Dates of Reporting Period: 07/13/15 to 08/08/15  Objective Reports of Subjective Statement: See above  Objective Measurements: FGA improvement from 11/30 to 20/30  Goal Update: See goals above  Plan: Continue POC.   Reason Skilled Services are Required: Continues to have difficulty with higher level balance and vestibular challenges, demonstrating scissored and staggered gait.     Problem List Patient Active Problem List   Diagnosis Date Noted  . Cerebral infarction due to thrombosis of left middle cerebral artery ([place])   . Stroke ([place]) 06/19/2015  . Essential hypertension 06/19/2015  . Hypokalemia  06/19/2015  . Tobacco abuse 06/19/2015    Cameron Sprang, PT, MPT [facility] [address] [place], [place], [address] Phone: [phone]   Fax:  [phone] 08/28/2015, 3:15 PM  Name: Catherine Floyd MRN: [id number] Date of Birth: [birth year]

## 2015-08-28 NOTE — Therapy (Signed)
[facility] [address] [place], [place], [address] Phone: [phone]   Fax:  [phone]  Occupational Therapy Treatment  Patient Details  Name: Catherine CostaMargaret B Petros MRN: [id number] Date of Birth: [birth year] No Data Recorded  Encounter Date: 08/28/2015      OT End of Session - 08/28/15 1441    Visit Number 13   Number of Visits 17   Date for OT Re-Evaluation 09/10/15   Authorization Type [employer] primary (20 visit limit),  Medicare secondary (g-code)   Authorization - Visit Number 13   Authorization - Number of Visits 20   OT Start Time 1407   OT Stop Time 1445   OT Time Calculation (min) 38 min      Past Medical History  Diagnosis Date  . Hypertension   . Cigarette nicotine dependence     Past Surgical History  Procedure Laterality Date  . Abdominal hysterectomy    . Appendectomy    . Tubal ligation      There were no vitals filed for this visit.  Visit Diagnosis:  Cognitive deficits following cerebral infarction  Decreased coordination  Visuospatial deficit  Weakness due to cerebrovascular accident      Subjective Assessment - 08/28/15 1704    Subjective  Pt reports that she is bumping into things on the R side, but it is better.  Pt reports that she has always been good at math.   Pertinent History see Epic   Patient Stated Goals to use RUE   Currently in Pain? No/denies                      OT Treatments/Exercises (OP) - 08/28/15 0001    ADLs   Work Discussed that decreased divided attention will impact safety with return to work and expressed concerns with distratctions while standing on ladder and squatting to floor without losing balance.  Pt verbalized understanding.     Cognitive Exercises   Financial Management Change-Making complex change making worksheet with mod difficulty/cues initially for errors.  Pt improved with repetition.  Issued remaining for homework.   Initially attempted to alternate attention with this task, but due to difficulty of task, unable.   Attention Span Divided Ambulating while tossing ball between hands with mod difficulty for balance/coordination.  Pt with min cues/CGA for LOB and getting too close to objects on R side.  Then ambulating while carrying plate/cup with direction changes without spills, but got too close/min v.c. to items on R side and 1 LOB.  Pt with incr difficulty with balance, navigation/scanning to R side with divided attention.   Functional Reaching Activities   High Level functional reaching with 1lb wt. on R wrist (for incr activity tolerance/strength) to place small pegs in vertical pegboard to copy design with 2 min v.c. for copying.                  OT Short Term Goals - 08/18/15 0939    OT SHORT TERM GOAL #1   Title I with HEP.   Baseline Pt returns demonstration of red theraband   Status Achieved   OT SHORT TERM GOAL #2   Title Pt will demonstrate ability to perform basic cooking task modified independently demonstrating good safety awareness.   Status Achieved   OT SHORT TERM GOAL #3   Title Pt will improve RUE fine motor coordiantion as evidenced by decreasing  9 hole peg test score to 30 secs without drops.  Baseline 29.31 secs   Status Achieved           OT Long Term Goals - 08/18/15 0940    OT LONG TERM GOAL #1   Title Pt will demonstrate ability to perform mod complex cooking( 2 items simultaneously) modified independently demonstrating good safety awareness.   Status On-going   OT LONG TERM GOAL #2   Title Pt will perform simulated work activities at a modified independent level including placing items that ar 5 lbs on over head shelf with RUE without drops.   Status On-going   OT LONG TERM GOAL #3   Title Pt will demonstrate ability to carry 20 lbs, 25 ft x 3 without rest break.   Status On-going   OT LONG TERM GOAL #4   Title Pt will demonstrate improved visual  perceptual skills as evidenced by copying a simple design with 95% or better accuracy.   Status On-going   OT LONG TERM GOAL #5   Title Pt will use RUE as her dominant hand at least 90% of the time for ADLs/ Functional activities.   Status On-going               Plan - 08/28/15 1731    Clinical Impression Statement Pt continues to demo difficulty with alternating/divided attention in functional activities which affects balance and safety.   Plan divided/alternating attention, check change-making homework (pt to complete sheet started in therapy), simulated work tasks   OT Home Exercise Plan issued : coordination 07/18/15, theraband 07/20/15 red, upgraded to green on 08/15/15   Consulted and Agree with Plan of Care Patient        Problem List Patient Active Problem List   Diagnosis Date Noted  . 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 Stroke ([facility]) 06/19/2015  . Essential hypertension 06/19/2015  . Hypokalemia 06/19/2015  . Tobacco abuse 06/19/2015    [facility] 08/28/2015, 5:34 PM  [facility] [address] [place], [place], [address] Phone: [phone]   Fax:  [phone]  Name: MALONE ADMIRE MRN: [id number] Date of Birth: 1951/03/17  Willa Frater, OTR/L 08/28/2015 5:34 PM

## 2015-08-31 ENCOUNTER — Encounter: Payer: Self-pay | Admitting: Rehabilitation

## 2015-08-31 ENCOUNTER — Ambulatory Visit: Payer: BLUE CROSS/BLUE SHIELD | Admitting: Occupational Therapy

## 2015-08-31 ENCOUNTER — Ambulatory Visit: Payer: BLUE CROSS/BLUE SHIELD | Admitting: Rehabilitation

## 2015-08-31 DIAGNOSIS — R269 Unspecified abnormalities of gait and mobility: Secondary | ICD-10-CM

## 2015-08-31 DIAGNOSIS — R278 Other lack of coordination: Secondary | ICD-10-CM

## 2015-08-31 DIAGNOSIS — R279 Unspecified lack of coordination: Secondary | ICD-10-CM

## 2015-08-31 DIAGNOSIS — IMO0002 Reserved for concepts with insufficient information to code with codable children: Secondary | ICD-10-CM

## 2015-08-31 DIAGNOSIS — I69319 Unspecified symptoms and signs involving cognitive functions following cerebral infarction: Secondary | ICD-10-CM

## 2015-08-31 DIAGNOSIS — R2681 Unsteadiness on feet: Secondary | ICD-10-CM

## 2015-08-31 DIAGNOSIS — R41842 Visuospatial deficit: Secondary | ICD-10-CM

## 2015-08-31 NOTE — Therapy (Signed)
[facility] [address] [place], [place], [address] Phone: [phone]   Fax:  [phone]  Physical Therapy Treatment  Patient Details  Name: Catherine Floyd MRN: [id number] Date of Birth: [birth year] No Data Recorded  Encounter Date: 08/31/2015      PT End of Session - 08/31/15 1407    Visit Number 11   Number of Visits 17   Date for PT Re-Evaluation 09/11/15   Authorization Type Medicare - G Codes every 10 vists   PT Start Time 1401   PT Stop Time 1445   PT Time Calculation (min) 44 min   Activity Tolerance Patient tolerated treatment well   Behavior During Therapy [facility] for tasks assessed/performed      Past Medical History  Diagnosis Date  . Hypertension   . Cigarette nicotine dependence     Past Surgical History  Procedure Laterality Date  . Abdominal hysterectomy    . Appendectomy    . Tubal ligation      There were no vitals filed for this visit.  Visit Diagnosis:  Abnormality of gait  Unsteadiness  Decreased coordination      Subjective Assessment - 08/31/15 1407    Subjective Reports that she has good days and bad with the staggering.    Pertinent History Cerebral infarction due to thrombosis of left middle cerebral artery (06/19/15), hypokalemia, HTN, tobacco abuse   Patient Stated Goals "I want my balance back."   Currently in Pain? No/denies          Self Care:  Discussed continued balance issues and increased scisorring during gait, esp when performing dual task and in busier environment.  After discussion with OT, feel that deficits may be more related to proprioception deficit and decreased balance on top of vestibular dysfunction.  Also continue to discuss continued POC and addition of visits to ensure pt meeting goals and has improved functional mobility.      NMR:  Performed high level balance activities to address/assess possible proprioception deficits.  Performed  cone tapping alternating LEs with cues for slower controlled movement.  Note somewhat increased difficulty with smoothness of RLE placement as well as increased difficulty with R LE stance.  Continued to increase amount of SLS time with tipping cone over and back up again with focus on slower controlled movements.  Pt able to perform with mild difficulty, however continued to note some difficulty with RLE placement as well as controlled stance without intermittent LOB.  Progressed to stepping RLE to 6"step then LLE to top step and back down for increased stabilization and forced use in RLE.  Switched to stepping LLE to 6" step with RLE tapping to objects without weight x 10 reps and with 2LB ankle weight for increased proprioceptive feedback x 10 reps.  Continue to note slight difficulty with task and decreased grading of movement.    High level gait and balance with scanning task in hallway while looking for cards in varying directions.  Note mild scissoring with task and min cues for stepping strategy.                   PT Education - 08/31/15 1838    Education provided Yes   Education Details Education on the need for continued therapy   Person(s) Educated Patient   Methods Explanation   Comprehension Verbalized understanding          PT Short Term Goals - 08/10/15 1833    PT SHORT  TERM GOAL #1   Title Pt will perform initial home exercise program with mod I using paper handout to indicate safe HEP compliance, to maximize functional gains. Target date: 08/10/15.   Baseline Met 10/6.   Status Achieved   PT SHORT TERM GOAL #2   Title Pt will increase self-selected gait speed from 3.09 ft/sec to 3.39 ft/sec to indicate increased efficiency of ambulation. Target date: 08/10/15.   Baseline 10/4:  self-selected gait speed = 3.07 ft/sec   Status Not Met   PT SHORT TERM GOAL #3   Title Pt will increase FGA score 15/30 to 19/30 to indicate improved dynamic gait stability. Target date:  08/10/15.   Baseline 10/4: FGA score = 20/30   Status Achieved   PT SHORT TERM GOAL #4   Title Pt will independently negotiate standard ramp and curb step to indicate pt safety traversing community obstacles. Target date: 08/10/15.   Status Achieved           PT Long Term Goals - 08/21/15 1110    PT LONG TERM GOAL #1   Title Pt will verbalize understanding of fall prevention strategies to decrease risk of falling in home environment. Target date: 09/07/15.   Baseline Discussed and provided paper handout on 10/17.   PT LONG TERM GOAL #2   Title Patient will increase FGA score from 15 to 23/30 to indicate decreased risk of falling.  Target date: 09/07/15.   PT LONG TERM GOAL #3   Title Pt will independently ambulate 1,000' over unlevel paved and grass surfaces with no overt LOB to indicate increased safety with community mobility. Target date: 09/07/15.   PT LONG TERM GOAL #4   Title Pt will verbalize understanding of fall prevention strategies to decrease risk of falling in home environment.  Target date: 09/07/15.   PT LONG TERM GOAL #5   Title Pt will verbalize understanding of CVA warning signs, pertinent risk factors to prevent future CVA. Target date: 09/07/15.   Baseline 10/11: Pt able to verbalize risk factors without cueing; requires 50% cueing for warning signs.   Status Partially Met   PT LONG TERM GOAL #6   Title Pt will increase Activities-Specific Balance Confidence Scale score from 46.3% to >/= 57% to indicate increased balance confidence. Target date: 09/07/15.               Plan - 08/31/15 1839    Clinical Impression Statement Skilled session continues to focus on high level balance and vestibular challenge as well as RLE WB and coordination tasks due to noted decreased proprioception.  Still unsure whether deficits are visual perceptual, visual vestibular or proprioceptive deficits.  Feel that there could be component of all three.     Pt will benefit from skilled  therapeutic intervention in order to improve on the following deficits Abnormal gait;Decreased activity tolerance;Decreased balance;Decreased endurance;Decreased coordination;Decreased safety awareness;Dizziness;Impaired sensation;Decreased cognition;Decreased strength   Rehab Potential Excellent   PT Frequency 2x / week   PT Duration 8 weeks   PT Treatment/Interventions ADLs/Self Care Home Management;DME Instruction;Gait training;Stair training;Functional mobility training;Therapeutic activities;Therapeutic exercise;Balance training;Patient/family education;Cognitive remediation;Neuromuscular re-education;Manual techniques;Vestibular   PT Next Visit Plan continue to address high level gait/balance with vestibular and cognitive challenge, proprioception and WB in RLE   Consulted and Agree with Plan of Care Patient        Problem List Patient Active Problem List   Diagnosis Date Noted  . Cerebral infarction due to thrombosis of left middle cerebral artery ([place])   .  Stroke ([place]) 06/19/2015  . Essential hypertension 06/19/2015  . Hypokalemia 06/19/2015  . Tobacco abuse 06/19/2015    Cameron Sprang, PT, MPT [facility] [address] [place], [place], [address] Phone: [phone]   Fax:  [phone] 08/31/2015, 6:42 PM  Name: Catherine Floyd MRN: [id number] Date of Birth: February 16, 1951

## 2015-08-31 NOTE — Therapy (Signed)
[facility] [address] [place], [place], [address] Phone: [phone]   Fax:  [phone]  Occupational Therapy Treatment  Patient Details  Name: Catherine Floyd MRN: [id number] Date of Birth: [birth year] No Data Recorded  Encounter Date: 08/31/2015      OT End of Session - 08/31/15 1340    Visit Number 14   Number of Visits 17   Date for OT Re-Evaluation 09/10/15   Authorization Type BCBS primary (20 visit limit),  Medicare secondary (g-code)   Authorization - Visit Number 14   Authorization - Number of Visits 20   OT Start Time 1318   OT Stop Time 1400   OT Time Calculation (min) 42 min   Activity Tolerance Patient tolerated treatment well   Behavior During Therapy [facility] for tasks assessed/performed      Past Medical History  Diagnosis Date  . Hypertension   . Cigarette nicotine dependence     Past Surgical History  Procedure Laterality Date  . Abdominal hysterectomy    . Appendectomy    . Tubal ligation      There were no vitals filed for this visit.  Visit Diagnosis:  No diagnosis found.    Treatment: Ambulating while tossing ball with both hand and performing category generation for animals. Pt continues to demonstrate scissoring when walking and walks to close to right side of wall. Several LOB noted. 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 Pt reports she sees  Neurologist on Novemeber 3rd. Pt reports concerns over fianances and she is anxious to return to work , therapist reinforced safety risk. Strengthening exercises performed 2 sets of 12 reps with 2 lbs weight for shoulder flexion, chest press and 2 sets x 12 reps biceps curls with 4 lbs, min v.c. For proper posture and RUE postion, pt demonstrates decreased RUE proprioception. Arm bike x 6 mins level 10 for  conditioning,                          OT Short Term Goals - 08/18/15 0939    OT SHORT TERM GOAL #1   Title I with HEP.   Baseline Pt returns demonstration of red theraband   Status Achieved   OT SHORT TERM GOAL #2   Title Pt will demonstrate ability to perform basic cooking task modified independently demonstrating good safety awareness.   Status Achieved   OT SHORT TERM GOAL #3   Title Pt will improve RUE fine motor coordiantion as evidenced by decreasing  9 hole peg test score to 30 secs without drops.   Baseline 29.31 secs   Status Achieved           OT Long Term Goals - 08/31/15 1332    OT LONG TERM GOAL #1   Title Pt will demonstrate ability to perform mod complex cooking( 2 items simultaneously) modified independently demonstrating good safety awareness.   Status On-going   OT LONG TERM GOAL #2   Title Pt will perform simulated work activities at a modified independent level including placing items that ar 5 lbs on over head shelf with RUE without drops.   OT LONG TERM GOAL #3   Title Pt will demonstrate ability to carry 20 lbs, 25 ft x 3 without rest break.   Status On-going   OT LONG TERM  GOAL #4   Title Pt will demonstrate improved visual perceptual skills as evidenced by copying a simple design with 95% or better accuracy.   Status On-going   OT LONG TERM GOAL #5   Title Pt will use RUE as her dominant hand at least 90% of the time for ADLs/ Functional activities.   Status Achieved               Problem List Patient Active Problem List   Diagnosis Date Noted  . 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 Stroke ([facility]) 06/19/2015  . Essential hypertension 06/19/2015  . Hypokalemia 06/19/2015  . Tobacco abuse 06/19/2015    RINE,KATHRYN 08/31/2015, 1:41 PM Keene Breath, OTR/L [phone] Phone: [phone] 1:44 PM 08/31/2015 [facility]  [facility] [address] [place], [place], [address] Phone: [phone]   Fax:  [phone]  Name: Catherine Floyd MRN: [id number] Date of Birth: October 15, 1951

## 2015-09-05 ENCOUNTER — Ambulatory Visit: Payer: BLUE CROSS/BLUE SHIELD | Attending: Internal Medicine | Admitting: Occupational Therapy

## 2015-09-05 ENCOUNTER — Ambulatory Visit: Payer: BLUE CROSS/BLUE SHIELD | Admitting: Physical Therapy

## 2015-09-05 ENCOUNTER — Other Ambulatory Visit: Payer: Self-pay | Admitting: *Deleted

## 2015-09-05 DIAGNOSIS — R279 Unspecified lack of coordination: Secondary | ICD-10-CM | POA: Diagnosis present

## 2015-09-05 DIAGNOSIS — R269 Unspecified abnormalities of gait and mobility: Secondary | ICD-10-CM

## 2015-09-05 DIAGNOSIS — R531 Weakness: Secondary | ICD-10-CM | POA: Diagnosis present

## 2015-09-05 DIAGNOSIS — R41842 Visuospatial deficit: Secondary | ICD-10-CM

## 2015-09-05 DIAGNOSIS — R42 Dizziness and giddiness: Secondary | ICD-10-CM | POA: Diagnosis present

## 2015-09-05 DIAGNOSIS — I69898 Other sequelae of other cerebrovascular disease: Secondary | ICD-10-CM | POA: Diagnosis present

## 2015-09-05 DIAGNOSIS — I679 Cerebrovascular disease, unspecified: Secondary | ICD-10-CM | POA: Insufficient documentation

## 2015-09-05 DIAGNOSIS — I69319 Unspecified symptoms and signs involving cognitive functions following cerebral infarction: Secondary | ICD-10-CM | POA: Diagnosis present

## 2015-09-05 DIAGNOSIS — R41841 Cognitive communication deficit: Secondary | ICD-10-CM | POA: Diagnosis present

## 2015-09-05 DIAGNOSIS — R278 Other lack of coordination: Secondary | ICD-10-CM

## 2015-09-05 DIAGNOSIS — R2681 Unsteadiness on feet: Secondary | ICD-10-CM | POA: Insufficient documentation

## 2015-09-05 DIAGNOSIS — G8191 Hemiplegia, unspecified affecting right dominant side: Secondary | ICD-10-CM | POA: Insufficient documentation

## 2015-09-05 DIAGNOSIS — IMO0002 Reserved for concepts with insufficient information to code with codable children: Secondary | ICD-10-CM

## 2015-09-05 NOTE — Therapy (Addendum)
[facility] [address] [place], [place], [address] Phone: [phone]   Fax:  [phone]  Occupational Therapy Treatment  Patient Details  Name: Catherine Floyd MRN: [id number] Date of Birth: Feb 23, 1951 No Data Recorded  Encounter Date: 09/05/2015      OT End of Session - 09/05/15 1304    Visit Number 15   Number of Visits 17   Date for OT Re-Evaluation 09/10/15   Authorization Type BCBS primary (20 visit limit),  Medicare secondary (g-code)   Authorization - Visit Number 15   Authorization - Number of Visits 20   OT Start Time 1150   OT Stop Time 1230   OT Time Calculation (min) 40 min   Activity Tolerance Patient tolerated treatment well   Behavior During Therapy [facility] for tasks assessed/performed      Past Medical History  Diagnosis Date  . Hypertension   . Cigarette nicotine dependence     Past Surgical History  Procedure Laterality Date  . Abdominal hysterectomy    . Appendectomy    . Tubal ligation      There were no vitals filed for this visit.  Visit Diagnosis:  Visuospatial deficit  Weakness due to cerebrovascular accident  Cognitive deficits following cerebral infarction  Decreased coordination      Subjective Assessment - 09/05/15 1305    Subjective  Pt reports she burned beans on the stove while taking on phone. Therapist discussed use of timers   Pertinent History see Epic   Patient Stated Goals to use RUE   Currently in Pain? No/denies      Treatment: Therapist discussed importance of using a timer for cooking as pt burned items she was warming on the stove. Therapist discussed how this relates to divided attention deficits. Physical task following written directions for cognitive component, min-mod difficulty and supervision/ minguard for balance as pt demonstrated several LOB. Quadraped lifting alternate UE/LE for core stability and increased body awareness, min-mod  facilitation for positioning, seated edge of mat lifting bottom off the mat and holding for 2 secs, x 10 reps min v.c. ambulating while carrying 10 lbs with bilateral UE's, then 15 lbs, approx 100', pt reports that 15 lbs is as much as she can safely carry right now. Placing/ removing 5 lbs weight from shelf with RUE x 5 reps without drops, pt reports she feels insecure with task. Ther NYO:XNRCOIO small peg design with RUE for fine motor coordination and visual perceptual skills, pt completed basic design with 100% accuracy. Therapist checked pt homework, she completed 1/2 with 100% accuracy for change making, pt to complete remainder for homework.                         OT Short Term Goals - 08/18/15 0939    OT SHORT TERM GOAL #1   Title I with HEP.   Baseline Pt returns demonstration of red theraband   Status Achieved   OT SHORT TERM GOAL #2   Title Pt will demonstrate ability to perform basic cooking task modified independently demonstrating good safety awareness.   Status Achieved   OT SHORT TERM GOAL #3   Title Pt will improve RUE fine motor coordiantion as evidenced by decreasing  9 hole peg test score to 30 secs without drops.   Baseline 29.31 secs   Status Achieved           OT Long Term Goals - 09/05/15 1154  OT LONG TERM GOAL #1   Title Pt will demonstrate ability to perform mod complex cooking( 2 items simultaneously) modified independently demonstrating good safety awareness.   Status On-going   OT LONG TERM GOAL #2   Title Pt will perform simulated work activities at a modified independent level including placing items that are 5 lbs on over head shelf with RUE without drops.   Baseline achieved but pt reports concerns over dropping   Status Achieved   OT LONG TERM GOAL #3   Title Pt will demonstrate ability to carry 20 lbs, 25 ft x 3 without rest break.   Baseline ongoing, picks up max of 15 lbs to carry  bilaterally   Status On-going   OT LONG  TERM GOAL #4   Title Pt will demonstrate improved visual perceptual skills as evidenced by copying a simple design with 95% or better accuracy.   Baseline met for basic peg design 09/05/15, address for more complex design   Status On-going   OT LONG TERM GOAL #5   Title Pt will use RUE as her dominant hand at least 90% of the time for ADLs/ Functional activities.   Status Achieved               Plan - 09/05/15 1301    Clinical Impression Statement Pt continues to demonstrate decreased divided and alternating attention.   Pt will benefit from skilled therapeutic intervention in order to improve on the following deficits (Retired) Decreased coordination;Decreased range of motion;Difficulty walking;Decreased endurance;Decreased safety awareness;Impaired sensation;Decreased knowledge of precautions;Decreased activity tolerance;Decreased balance;Decreased knowledge of use of DME;Impaired UE functional use;Impaired vision/preception;Impaired perceived functional ability;Decreased strength;Decreased mobility;Decreased cognition   Rehab Potential Good   OT Frequency 2x / week   OT Duration 8 weeks   OT Treatment/Interventions Self-care/ADL training;Therapeutic exercise;Patient/family education;Functional Mobility Training;Balance training;Manual Therapy;Neuromuscular education;Ultrasound;Energy conservation;Therapeutic exercises;Therapeutic activities;DME and/or AE instruction;Parrafin;Cryotherapy;Electrical Stimulation;Fluidtherapy;Cognitive remediation/compensation;Visual/perceptual remediation/compensation;Passive range of motion;Contrast Bath;Moist Heat   Plan note to MD, divided/ alternating attention, visuospatial activities/ environmental scanning   OT Home Exercise Plan issued : coordination 07/18/15, theraband 07/20/15 red, upgraded to green on 08/15/15   Consulted and Agree with Plan of Care Patient        Problem List Patient Active Problem List   Diagnosis Date Noted  . Cerebral  infarction due to thrombosis of left middle cerebral artery ([place])   . Stroke ([place]) 06/19/2015  . Essential hypertension 06/19/2015  . Hypokalemia 06/19/2015  . Tobacco abuse 06/19/2015    Catherine Floyd 09/05/2015, 1:06 PM Theone Murdoch, OTR/L [phone] Phone: [phone] 1:06 PM 09/05/2015 [facility] [address] [place], [place], [address] Phone: [phone]   Fax:  [phone]  Name: Catherine Floyd MRN: [id number] Date of Birth: 1951/02/13

## 2015-09-05 NOTE — Patient Outreach (Signed)
[facility] Care Management  09/05/2015  Catherine Floyd 1951-06-01 [id number]  EMMI-stroke follow up: Telephone call to patient; left message voice mail requesting return call.  Plan: will follow up.  Colleen CanLinda Mace Weinberg, RN BSN CCM Care Management Coordinator [facility] Care Management  [phone]

## 2015-09-05 NOTE — Therapy (Signed)
[facility] [address] [place], [place], [address] Phone: [phone]   Fax:  [phone]  Physical Therapy Treatment  Patient Details  Name: Catherine Floyd MRN: [id number] Date of Birth: July 21, 1951 No Data Recorded  Encounter Date: 09/05/2015      PT End of Session - 09/05/15 1951    Visit Number 12   Number of Visits 17   Date for PT Re-Evaluation 09/11/15   Authorization Type Medicare - G Codes every 10 vists   PT Start Time 1105   PT Stop Time 1145   PT Time Calculation (min) 40 min   Activity Tolerance Patient tolerated treatment well   Behavior During Therapy [facility] for tasks assessed/performed      Past Medical History  Diagnosis Date  . Hypertension   . Cigarette nicotine dependence     Past Surgical History  Procedure Laterality Date  . Abdominal hysterectomy    . Appendectomy    . Tubal ligation      There were no vitals filed for this visit.  Visit Diagnosis:  Abnormality of gait  Unsteadiness  Decreased coordination      Subjective Assessment - 09/05/15 1109    Subjective Pt tearful at beginning of session. Reports feeling emotionally "numb"; feels as though she has isolated herself, stating "I want to go back to work but I know I may not be able to to that physically. My mom passed recently. And I guess I do feel depressed. Do you think I should tell the neurologist about this on Thursday?"   Pertinent History Cerebral infarction due to thrombosis of left middle cerebral artery (06/19/15), hypokalemia, HTN, tobacco abuse   Limitations Walking   Patient Stated Goals "I want my balance back."   Currently in Pain? No/denies            [facility] PT Assessment - 09/05/15 0001    Ambulation/Gait   Gait Comments During gait over unlevel paved/grass surfaces, pt required intermittent supervision to avoid walking off R edge of sidwalk, ensure pt did not collide with vehicles on pt's R  side.   Functional Gait  Assessment   Gait assessed  Yes   Gait Level Surface Walks 20 ft in less than 7 sec but greater than 5.5 sec, uses assistive device, slower speed, mild gait deviations, or deviates 6-10 in outside of the 12 in walkway width.  6.5 seconds   Change in Gait Speed Able to smoothly change walking speed without loss of balance or gait deviation. Deviate no more than 6 in outside of the 12 in walkway width.   Gait with Horizontal Head Turns Performs head turns smoothly with no change in gait. Deviates no more than 6 in outside 12 in walkway width   Gait with Vertical Head Turns Performs head turns with no change in gait. Deviates no more than 6 in outside 12 in walkway width.   Gait and Pivot Turn Pivot turns safely in greater than 3 sec and stops with no loss of balance, or pivot turns safely within 3 sec and stops with mild imbalance, requires small steps to catch balance.   Step Over Obstacle Is able to step over one shoe box (4.5 in total height) without changing gait speed. No evidence of imbalance.   Gait with Narrow Base of Support Ambulates 7-9 steps.   Gait with Eyes Closed Walks 20 ft, uses assistive device, slower speed, mild gait deviations, deviates 6-10 in outside 12 in walkway  width. Ambulates 20 ft in less than 9 sec but greater than 7 sec.   Ambulating Backwards Walks 20 ft, uses assistive device, slower speed, mild gait deviations, deviates 6-10 in outside 12 in walkway width.   Steps Alternating feet, no rail.   Total Score 24                     [facility] Adult PT Treatment/Exercise - 09/05/15 0001    Ambulation/Gait   Ambulation/Gait Yes   Ambulation/Gait Assistance 5: Supervision   Ambulation Distance (Feet) 1100 Feet   Assistive device None   Gait Pattern Step-through pattern   Ambulation Surface Level;Indoor;Unlevel;Outdoor;Paved;Grass                PT Education - 09/05/15 1955    Education provided Yes   Education Details FGA  score, progress. Discussed progress toward LTG's and ongoing need for supervision when walking, at times, due to visuospatial deficit, decreased attention to R visual field.   Person(s) Educated Patient   Methods Explanation   Comprehension Verbalized understanding          PT Short Term Goals - 08/10/15 1833    PT SHORT TERM GOAL #1   Title Pt will perform initial home exercise program with mod I using paper handout to indicate safe HEP compliance, to maximize functional gains. Target date: 08/10/15.   Baseline Met 10/6.   Status Achieved   PT SHORT TERM GOAL #2   Title Pt will increase self-selected gait speed from 3.09 ft/sec to 3.39 ft/sec to indicate increased efficiency of ambulation. Target date: 08/10/15.   Baseline 10/4:  self-selected gait speed = 3.07 ft/sec   Status Not Met   PT SHORT TERM GOAL #3   Title Pt will increase FGA score 15/30 to 19/30 to indicate improved dynamic gait stability. Target date: 08/10/15.   Baseline 10/4: FGA score = 20/30   Status Achieved   PT SHORT TERM GOAL #4   Title Pt will independently negotiate standard ramp and curb step to indicate pt safety traversing community obstacles. Target date: 08/10/15.   Status Achieved           PT Long Term Goals - 09/05/15 1128    PT LONG TERM GOAL #1   Title Pt will verbalize understanding of fall prevention strategies to decrease risk of falling in home environment. Target date: 09/07/15.   Baseline Met 11/1.   Status Achieved   PT LONG TERM GOAL #2   Title Patient will increase FGA score from 15 to 23/30 to indicate decreased risk of falling.  Target date: 09/07/15.   Baseline 11/1: FGA score = 24/30   Status Achieved   PT LONG TERM GOAL #3   Title Pt will independently ambulate 1,000' over unlevel paved and grass surfaces with no overt LOB to indicate increased safety with community mobility. Target date: 09/07/15.   Status On-going   PT LONG TERM GOAL #4   Title Pt will verbalize understanding of  fall prevention strategies to decrease risk of falling in home environment.  Target date: 09/07/15.   Baseline LTG #1 duplicated. See above.   Status Deferred   PT LONG TERM GOAL #5   Title Pt will verbalize understanding of CVA warning signs, pertinent risk factors to prevent future CVA. Target date: 09/07/15.   Baseline --   Status On-going   PT LONG TERM GOAL #6   Title Pt will increase Activities-Specific Balance Confidence Scale score from 46.3% to >/=  57% to indicate increased balance confidence. Target date: 09/07/15.   Status On-going               Plan - 09/05/15 1956    Clinical Impression Statement LTG for FGA met with score of 24/30, suggesting improvement in dynamic gait stability. Pt expressing feelings of depression due to CVA-related changes and mother's recent passing. Pt gave verbal permission for this PT to send treatment note to neurologist. Pt continues to required supervision with ambulation at times due to visuospatial deficit vs. R visual inattention causing poor obstacle negotiation on R side.    Pt will benefit from skilled therapeutic intervention in order to improve on the following deficits Abnormal gait;Decreased activity tolerance;Decreased balance;Decreased endurance;Decreased coordination;Decreased safety awareness;Dizziness;Impaired sensation;Decreased cognition;Decreased strength   Rehab Potential Excellent   PT Frequency 2x / week   PT Duration 8 weeks   PT Treatment/Interventions ADLs/Self Care Home Management;DME Instruction;Gait training;Stair training;Functional mobility training;Therapeutic activities;Therapeutic exercise;Balance training;Patient/family education;Cognitive remediation;Neuromuscular re-education;Manual techniques;Vestibular   PT Next Visit Plan Finish checking LTG's and recert, if appopriate. Continue to address high level gait/balance with vestibular and cognitive challenge, proprioception and WB in RLE   Consulted and Agree with Plan  of Care Patient        Problem List Patient Active Problem List   Diagnosis Date Noted  . Cerebral infarction due to thrombosis of left middle cerebral artery ([place])   . Stroke ([place]) 06/19/2015  . Essential hypertension 06/19/2015  . Hypokalemia 06/19/2015  . Tobacco abuse 06/19/2015    Billie Ruddy, PT, DPT [facility] [address] [place], [place], [address] Phone: [phone]   Fax:  [phone] 09/05/2015, 8:02 PM   Name: SINA SUMPTER MRN: [id number] Date of Birth: Aug 26, 1951

## 2015-09-07 ENCOUNTER — Ambulatory Visit: Payer: BLUE CROSS/BLUE SHIELD | Admitting: Physical Therapy

## 2015-09-07 ENCOUNTER — Encounter: Payer: Self-pay | Admitting: Neurology

## 2015-09-07 ENCOUNTER — Ambulatory Visit (INDEPENDENT_AMBULATORY_CARE_PROVIDER_SITE_OTHER): Payer: BLUE CROSS/BLUE SHIELD | Admitting: Neurology

## 2015-09-07 ENCOUNTER — Ambulatory Visit: Payer: BLUE CROSS/BLUE SHIELD | Admitting: Occupational Therapy

## 2015-09-07 VITALS — BP 166/85 | HR 73 | Ht 62.0 in | Wt 147.6 lb

## 2015-09-07 DIAGNOSIS — I63312 Cerebral infarction due to thrombosis of left middle cerebral artery: Secondary | ICD-10-CM

## 2015-09-07 DIAGNOSIS — G8191 Hemiplegia, unspecified affecting right dominant side: Secondary | ICD-10-CM

## 2015-09-07 DIAGNOSIS — R41842 Visuospatial deficit: Secondary | ICD-10-CM | POA: Diagnosis not present

## 2015-09-07 DIAGNOSIS — I10 Essential (primary) hypertension: Secondary | ICD-10-CM

## 2015-09-07 DIAGNOSIS — F17200 Nicotine dependence, unspecified, uncomplicated: Secondary | ICD-10-CM | POA: Diagnosis not present

## 2015-09-07 DIAGNOSIS — R269 Unspecified abnormalities of gait and mobility: Secondary | ICD-10-CM

## 2015-09-07 DIAGNOSIS — I69319 Unspecified symptoms and signs involving cognitive functions following cerebral infarction: Secondary | ICD-10-CM

## 2015-09-07 DIAGNOSIS — R279 Unspecified lack of coordination: Secondary | ICD-10-CM

## 2015-09-07 DIAGNOSIS — R42 Dizziness and giddiness: Secondary | ICD-10-CM

## 2015-09-07 DIAGNOSIS — R278 Other lack of coordination: Secondary | ICD-10-CM

## 2015-09-07 DIAGNOSIS — R2681 Unsteadiness on feet: Secondary | ICD-10-CM

## 2015-09-07 DIAGNOSIS — IMO0002 Reserved for concepts with insufficient information to code with codable children: Secondary | ICD-10-CM

## 2015-09-07 MED ORDER — AMLODIPINE BESYLATE 5 MG PO TABS: 10.0000 mg | ORAL_TABLET | Freq: Every day | ORAL | Status: AC

## 2015-09-07 NOTE — Therapy (Signed)
[facility] [address] [place], [place], [address] Phone: [phone]   Fax:  [phone]  Occupational Therapy Treatment  Patient Details  Name: Catherine Floyd MRN: [id number] Date of Birth: [birth year] No Data Recorded  Encounter Date: 09/07/2015      OT End of Session - 09/07/15 1210    Visit Number 16   Number of Visits 17   Date for OT Re-Evaluation 09/10/15   Authorization Type [employer]BS primary (20 visit limit),  Medicare secondary (g-code)   Authorization - Visit Number 16   Authorization - Number of Visits 20   OT Start Time 1020   OT Stop Time 1100   OT Time Calculation (min) 40 min   Activity Tolerance Patient tolerated treatment well   Behavior During Therapy [facility] for tasks assessed/performed      Past Medical History  Diagnosis Date  . Hypertension   . Cigarette nicotine dependence     Past Surgical History  Procedure Laterality Date  . Abdominal hysterectomy    . Appendectomy    . Tubal ligation      There were no vitals filed for this visit.  Visit Diagnosis:  Cognitive deficits following cerebral infarction  Decreased coordination  Visuospatial deficit  Weakness due to cerebrovascular accident      Subjective Assessment - 09/07/15 1207    Pertinent History see Epic   Patient Stated Goals to use RUE   Currently in Pain? No/denies        Treatment: Environmental scanning task in a mod distracting environment, Pt missed 4/13 items on the first pass, pt demonstrates abnormal gait and decreased spatial awareness when scanning.  pt located an additional 2 items on trial and pt required v.c.to locate remaining 2 items on the 3rd trial. Therapist started checking progress towards goals. 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 Pt demonstrates poor  environmental scanning and pt demonstrates decreased divided attention. Pt reports she burned food on the stove while on the telephone earlier this week. Pt started a check book task . Pt made errors in completing 1 of the checks and therefore had to void. Pt required increased time and therefore was issued remainder of the task for homework. Plan to renew pt next visit to address cognitive deficits, visuospatial deficits and strength in prep for return to work.                             OT Short Term Goals - 08/18/15 0939    OT SHORT TERM GOAL #1   Title I with HEP.   Baseline Pt returns demonstration of red theraband   Status Achieved   OT SHORT TERM GOAL #2   Title Pt will demonstrate ability to perform basic cooking task modified independently demonstrating good safety awareness.   Status Achieved   OT SHORT TERM GOAL #3   Title Pt will improve RUE fine motor coordiantion as evidenced by decreasing  9 hole peg test score to 30 secs without drops.   Baseline 29.31 secs   Status Achieved           OT Long Term Goals - 09/07/15 1027    OT LONG TERM GOAL #1   Title Pt will demonstrate ability to perform mod complex cooking( 2 items simultaneously) modified independently demonstrating  good safety awareness.   Baseline not met, pt burned food cooking at home   Status On-going   [place] #2   Title Pt will perform simulated work activities at a modified independent level including placing items that ar 5 lbs on over head shelf with RUE without drops.   Status Achieved   OT LONG TERM GOAL #3   Title Pt will demonstrate ability to carry 20 lbs, 25 ft x 3 without rest break.   Baseline ongoing, picks up max of 15 lbs to carry  bilaterally   Status On-going   OT LONG TERM GOAL #4   Title Pt will demonstrate improved visual perceptual skills as evidenced by copying a simple design with 95% or better accuracy.   Status On-going   OT LONG TERM GOAL #5    Title Pt will use RUE as her dominant hand at least 90% of the time for ADLs/ Functional activities.   Status Achieved               Plan - 09/07/15 1207    Clinical Impression Statement Pt demonstrates decreased environmental scanning in a moderately busy environment. Therapist discussed concerns with pt. regarding her readiness to return to work due to cognitive deficits and decreased spatial awareness.   Pt will benefit from skilled therapeutic intervention in order to improve on the following deficits (Retired) Decreased coordination;Decreased range of motion;Difficulty walking;Decreased endurance;Decreased safety awareness;Impaired sensation;Decreased knowledge of precautions;Decreased activity tolerance;Decreased balance;Decreased knowledge of use of DME;Impaired UE functional use;Impaired vision/preception;Impaired perceived functional ability;Decreased strength;Decreased mobility;Decreased cognition   Rehab Potential Good   OT Frequency 2x / week   OT Duration 8 weeks   OT Treatment/Interventions Self-care/ADL training;Therapeutic exercise;Patient/family education;Functional Mobility Training;Balance training;Manual Therapy;Neuromuscular education;Ultrasound;Energy conservation;Therapeutic exercises;Therapeutic activities;DME and/or AE instruction;Parrafin;Cryotherapy;Electrical Stimulation;Fluidtherapy;Cognitive remediation/compensation;Visual/perceptual remediation/compensation;Passive range of motion;Contrast Bath;Moist Heat   Plan continue to address divided/ alternating attention in a functional context,  renew   OT Home Exercise Plan issued : coordination 07/18/15, theraband 07/20/15 red, upgraded to green on 08/15/15   Consulted and Agree with Plan of Care Patient        Problem List Patient Active Problem List   Diagnosis Date Noted  . Cerebral infarction due to thrombosis of left middle cerebral artery ([place])   . Stroke ([place]) 06/19/2015  . Essential hypertension  06/19/2015  . Hypokalemia 06/19/2015  . Tobacco abuse 06/19/2015    Denishia Citro 09/07/2015, 5:06 PM Theone [facility], OTR/L [phone] Phone: [phone] 5:07 PM 09/07/2015 [facility] [address] [place], [place], [address] Phone: [phone]   Fax:  [phone]  Name: Catherine Floyd MRN: [id number] Date of Birth: 04/14/1951

## 2015-09-07 NOTE — Progress Notes (Signed)
STROKE NEUROLOGY FOLLOW UP NOTE  NAME: Catherine Floyd DOB: [birth year]  REASON FOR VISIT: stroke follow up HISTORY FROM: pt and chart  Today we had the pleasure of seeing Catherine Floyd in follow-up at our Neurology Clinic. Pt was accompanied by no one.   History Summary Catherine Floyd is a 64 y.o. female with history of HTN, and current smoker admitted on 06/19/15 for right side weakness. MRI showed left IC stroke. Symptoms gradually improving.MRA showed mild intracranial athero. Stroke work up negative including TTE, CUS, LDL and A1C. she was put on aspirin and Lipitor, and recommend to quit smoking. She was discharged in good condition  Interval History During the interval time, the patient has been doing well.  No recurrent strokelike symptoms. Still smoking, willing to quit. Blood pressure is still high at home 140-150, today in clinic in 166/85. She is on Norvasc 5, HCTZ 12.5 and doxazosin 4 mg. No significant neurological deficit. She is asking for going back to work.  REVIEW OF SYSTEMS: Full 14 system review of systems performed and notable only for those listed below and in HPI above, all others are negative:  Constitutional:  Weight gain, fatigue Cardiovascular:  Ear/Nose/Throat:   Skin:  Eyes:   Respiratory:  SOB Gastroitestinal:   Genitourinary:  Hematology/Lymphatic:   Endocrine: Feeling hot Musculoskeletal:  Joint pain, aching muscles Allergy/Immunology:   Neurological:  Weakness Psychiatric: Not enough sleep, decreased energy Sleep: Insomnia  The following represents the patient's updated allergies and side effects list: Allergies  Allergen Reactions  . Penicillins     Throat closes up   . Sulfa Antibiotics     Hives     The neurologically relevant items on the patient's problem list were reviewed on today's visit.  Neurologic Examination  A problem focused neurological exam (12 or more points of the single system neurologic  examination, vital signs counts as 1 point, cranial nerves count for 8 points) was performed.  Blood pressure 166/85, pulse 73, height  (1.575 m), weight 147 lb 9.6 oz (66.951 kg).  General - Well nourished, well developed, in no apparent distress.  Ophthalmologic - Sharp disc margins OU.   Cardiovascular - Regular rate and rhythm with no murmur.  Mental Status -  Level of arousal and orientation to time, place, and person were intact. Language including expression, naming, repetition, comprehension was assessed and found intact. Attention span and concentration were normal. Recent and remote memory were intact. Fund of Knowledge was assessed and was intact.  Cranial Nerves II - XII - II - Visual field intact OU. III, IV, VI - Extraocular movements intact. V - Facial sensation intact bilaterally. VII - Facial movement intact bilaterally. VIII - Hearing & vestibular intact bilaterally. X - Palate elevates symmetrically. XI - Chin turning & shoulder shrug intact bilaterally. XII - Tongue protrusion intact.  Motor Strength - The patient's strength was normal in all extremities and pronator drift was absent.  Bulk was normal and fasciculations were absent.   Motor Tone - Muscle tone was assessed at the neck and appendages and was normal.  Reflexes - The patient's reflexes were 1+ in all extremities and she had no pathological reflexes.  Sensory - Light touch, temperature/pinprick were assessed and were normal.    Coordination - The patient had normal movements in the hands and feet with no ataxia or dysmetria.  Tremor was absent.  Gait and Station - normal gait walking straight line but stumbles on turning.  Data reviewed: I personally reviewed the images and agree with the radiology interpretations.  Dg Chest 2 View 06/19/2015 IMPRESSION: No evidence of acute cardiopulmonary disease.   Ct Head Wo Contrast 06/19/2015 IMPRESSION: Mild ethmoid sinus disease. No  intracranial mass, hemorrhage, or extra-axial fluid collection. No focal gray - white compartment lesions/acute appearing infarct.   Mri and Mra Brain Wo Contrast 06/19/2015 IMPRESSION: MRI HEAD: Acute subcentimeter infarct posterior limb of the LEFT internal capsule. Mild to moderate white matter changes compatible chronic small vessel ischemic disease. MRA HEAD: Accessory LEFT MCA (for purposes of this report described as proximal and distal), with focal high-grade stenosis of distal LEFT MCA M2 segment. Focal high-grade stenosis LEFT M3 segment. Multiple stenosis of the posterior circulation including focal high-grade stenosis LEFT P3 segment. No acute large vessel occlusion.  CUS - There is 1-39% bilateral ICA stenosis. Vertebral artery flow is antegrade.   2D Echocardiogram - Left ventricle: The cavity size was normal. Wall thickness wasnormal. Systolic function was normal. The estimated ejectionfraction was in the range of 55% to 60%. Wall motion was normal;there were no regional wall motion abnormalities. Dopplerparameters are consistent with abnormal left ventricularrelaxation (grade 1 diastolic dysfunction). - Aortic valve: There was no stenosis. - Mitral valve: There was trivial regurgitation. - Right ventricle: The cavity size was normal. Systolic functionwas normal. - Tricuspid valve: Peak RV-RA gradient (S): 19 mm Hg. - Pulmonary arteries: PA peak pressure: 22 mm Hg (S). - Inferior vena cava: The vessel was normal in size. Therespirophasic diameter changes were in the normal range (>= 50%),consistent with normal central venous pressure. Impressions: Normal LV size and systolic function, EF 55-60%. Normal RV size and systolic function. No significant valvular abnormalities.  Component     Latest Ref Rng 06/20/2015  Cholesterol     0 - 200 mg/dL 119  Triglycerides     <150 mg/dL 90  HDL Cholesterol     >40 mg/dL 31 (L)  Total CHOL/HDL Ratio      3.6  VLDL      0 - 40 mg/dL 18  LDL (calc)     0 - 99 mg/dL 63  Hemoglobin J4N     4.8 - 5.6 % 5.5  Mean Plasma Glucose      111    Assessment: As you may recall, she is a 64 y.o. African American female with PMH of HTN, and current smoker admitted on 06/19/15 for left IC stroke. Symptoms recovered well.MRA showed mild intracranial athero. Stroke work up negative including TTE, CUS, LDL and A1C. she was put on aspirin and Lipitor, and recommend to quit smoking. During the interval time, the patient has been doing well. Still smoking, have not quit yet. BP still high, on 3 BP meds, will increase norvasc dose.  Plan:  - Continue ASA and lipitor for stroke prevention - quit smoking - check BP at home and record - recommend to increase norvasc to  daily for BP control - Follow up with your primary care physician for stroke risk factor modification. Recommend maintain blood pressure goal <130/80, diabetes with hemoglobin A1c goal below 6.5% and lipids with LDL cholesterol goal below 70 mg/dL.  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 Greater than 50% of time was spent in counseling and  coordination of care.    No orders of the defined types were placed in this encounter.    Meds ordered this encounter  Medications  . atorvastatin (LIPITOR) 20 MG tablet    Sig:   . amLODipine (NORVASC) 5 MG tablet    Sig: Take 2 tablets (10 mg total) by mouth daily.    Dispense:  30 tablet    Refill:  0    Patient Instructions  - Continue ASA and lipitor for stroke prevention - quit smoking - check BP at home and record - recommend to increase norvasc to 10mg  daily for BP control - Follow up with your primary care physician for stroke risk factor modification. Recommend maintain blood pressure goal <130/80, diabetes with  hemoglobin A1c goal below 6.5% and lipids with LDL cholesterol goal below 70 mg/dL.  - OK to go back to work but gradually increase work load. May consider light duty part time first and gradually to full time depending on what you feel - continue PT/OT - follow up in 3 months   Marvel PlanJindong Ruairi Stutsman, MD PhD [facility] [address] [place], [place] [address] [phone]
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
## 2015-09-07 NOTE — Patient Instructions (Addendum)
-   Continue ASA and lipitor for stroke prevention - quit smoking - check BP at home and record - recommend to increase norvasc to 10mg  daily for BP control - Follow up with your primary care physician for stroke risk factor modification. Recommend maintain blood pressure goal <130/80, diabetes with hemoglobin A1c goal below 6.5% and lipids with LDL cholesterol goal below 70 mg/dL.  - OK to go back to work but gradually increase work load. May consider light duty part time first and gradually to full time depending on what you feel - continue PT/OT - follow up in 3 months

## 2015-09-08 ENCOUNTER — Telehealth: Payer: Self-pay

## 2015-09-08 DIAGNOSIS — Z0289 Encounter for other administrative examinations: Secondary | ICD-10-CM

## 2015-09-08 NOTE — Telephone Encounter (Signed)
LFt vm for patient to return phone call. Pt had a medical form to be filled out. Pt did fill out a medical release form, but Rn needed to verified payment.

## 2015-09-11 ENCOUNTER — Other Ambulatory Visit: Payer: Self-pay | Admitting: *Deleted

## 2015-09-11 ENCOUNTER — Ambulatory Visit: Payer: BLUE CROSS/BLUE SHIELD | Admitting: Rehabilitation

## 2015-09-11 NOTE — Patient Outreach (Signed)
[facility] Care Management  09/11/2015  Ivor CostaMargaret B Bradway [birth year] [id number]   Follow up call attempt x 3 to patient; left message on voice mail requesting call back.  Plan: will close out case and send MD closure letter. Colleen CanLinda Denita Lun, RN BSN CCM Care Management Coordinator [facility] Care Management  [phone]

## 2015-09-11 NOTE — Telephone Encounter (Signed)
Pt came to office today to sign her medical release form. Pt did pay the 25.00 fee to have it filled out and fax. Pt also sign the form to be fax. Rn explain that she can do light duty work at [employer].

## 2015-09-12 ENCOUNTER — Encounter: Payer: Self-pay | Admitting: Physical Therapy

## 2015-09-12 ENCOUNTER — Ambulatory Visit: Payer: BLUE CROSS/BLUE SHIELD | Admitting: Physical Therapy

## 2015-09-12 VITALS — BP 150/80 | HR 80

## 2015-09-12 DIAGNOSIS — R2681 Unsteadiness on feet: Secondary | ICD-10-CM

## 2015-09-12 DIAGNOSIS — R41842 Visuospatial deficit: Secondary | ICD-10-CM | POA: Diagnosis not present

## 2015-09-12 DIAGNOSIS — G8191 Hemiplegia, unspecified affecting right dominant side: Secondary | ICD-10-CM

## 2015-09-12 DIAGNOSIS — IMO0002 Reserved for concepts with insufficient information to code with codable children: Secondary | ICD-10-CM

## 2015-09-12 DIAGNOSIS — R269 Unspecified abnormalities of gait and mobility: Secondary | ICD-10-CM
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
## 2015-09-13 ENCOUNTER — Ambulatory Visit: Payer: BLUE CROSS/BLUE SHIELD | Admitting: Physical Therapy

## 2015-09-13 ENCOUNTER — Ambulatory Visit: Payer: BLUE CROSS/BLUE SHIELD | Admitting: Occupational Therapy

## 2015-09-13 DIAGNOSIS — I69319 Unspecified symptoms and signs involving cognitive functions following cerebral infarction: Secondary | ICD-10-CM

## 2015-09-13 DIAGNOSIS — R42 Dizziness and giddiness: Secondary | ICD-10-CM

## 2015-09-13 DIAGNOSIS — R41842 Visuospatial deficit: Secondary | ICD-10-CM

## 2015-09-13 DIAGNOSIS — R269 Unspecified abnormalities of gait and mobility: Secondary | ICD-10-CM

## 2015-09-13 DIAGNOSIS — R278 Other lack of coordination: Secondary | ICD-10-CM

## 2015-09-13 DIAGNOSIS — IMO0002 Reserved for concepts with insufficient information to code with codable children: Secondary | ICD-10-CM

## 2015-09-13 DIAGNOSIS — R279 Unspecified lack of coordination: Secondary | ICD-10-CM

## 2015-09-13 DIAGNOSIS — R2681 Unsteadiness on feet: Secondary | ICD-10-CM
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
## 2015-09-13 NOTE — Therapy (Signed)
[facility] [address] [place], [place], [address] Phone: [phone]   Fax:  [phone]  Occupational Therapy Treatment  Patient Details  Name: Catherine Floyd MRN: [id number] Date of Birth: May 02, 1951 No Data Recorded  Encounter Date: 09/13/2015      OT End of Session - 09/13/15 1012    Visit Number 17   Number of Visits 25   Date for OT Re-Evaluation 10/13/15   Authorization Type BCBS primary (20 visit limit),  Medicare secondary (g-code)   Authorization Time Period week 1/4   Authorization - Visit Number 17   Authorization - Number of Visits 20   OT Start Time [phone]   OT Stop Time 0930   OT Time Calculation (min) 39 min   Activity Tolerance Patient tolerated treatment well   Behavior During Therapy [facility] for tasks assessed/performed      Past Medical History  Diagnosis Date  . Hypertension   . Cigarette nicotine dependence     Past Surgical History  Procedure Laterality Date  . Abdominal hysterectomy    . Appendectomy    . Tubal ligation      There were no vitals filed for this visit.  Visit Diagnosis:  Weakness due to cerebrovascular accident - Plan: Ot plan of care cert/re-cert  Decreased coordination - Plan: Ot plan of care cert/re-cert  Visuospatial deficit - Plan: Ot plan of care cert/re-cert  Cognitive deficits following cerebral infarction - Plan: Ot plan of care cert/re-cert      Subjective Assessment - 09/13/15 0857    Subjective  Pt reports she may be returning to work soon   Pertinent History see Epic   Patient Stated Goals to use RUE   Currently in Pain? No/denies         Treatment: Therapist checked progress towards long term goals. Pt performed basic environmental scanning task with 100% accuracy, yet she still walked too close to walls with scissoring gait and was unstable.  Simulated work activity, pt carried 20 lbs with bilateral UE's  100 ft x 3 with rest break  in between.  Copying designs and then pegboard activity with 95% or better accuracy.                     OT Short Term Goals - 08/18/15 0939    OT SHORT TERM GOAL #1   Title I with HEP.   Baseline Pt returns demonstration of red theraband   Status Achieved   OT SHORT TERM GOAL #2   Title Pt will demonstrate ability to perform basic cooking task modified independently demonstrating good safety awareness.   Status Achieved   OT SHORT TERM GOAL #3   Title Pt will improve RUE fine motor coordiantion as evidenced by decreasing  9 hole peg test score to 30 secs without drops.   Baseline 29.31 secs   Status Achieved           OT Long Term Goals - 09/14/15 1241    OT LONG TERM GOAL #1   Title Pt will demonstrate ability to perform mod complex cooking( 2 items simultaneously) modified independently demonstrating good safety awareness.     target date-10/12/15   Baseline not met, pt burned food cooking at home    Time 4   Status On-going   OT LONG TERM GOAL #2   Title Pt will perform simulated work activities at a modified independent level including placing items that ar 5 lbs on over  head shelf with RUE without drops.   Baseline achieved but pt reports concerns over dropping   Time 4   Status Achieved   OT LONG TERM GOAL #3   Title Pt will demonstrate ability to carry 20 lbs, 25 ft x 3 without rest break.   Baseline Pt carried 20 lbs x 100 ft x 3, with rest in between using bilateral UE's   Time 4   Period Weeks   Status Achieved   OT LONG TERM GOAL #4   Title Pt will demonstrate improved visual perceptual skills as evidenced by copying a simple design with 95% or better accuracy.   Baseline met for peg design, 95% accuracy with copying house, pt was able to draw correctly second trial   Status Achieved   OT LONG TERM GOAL #5   Title Pt will use RUE as her dominant hand at least 90% of the time for ADLs/ Functional activities.   Status Achieved   Long Term  Additional Goals   Additional Long Term Goals Yes   OT LONG TERM GOAL #6   Title Pt will perform simulated work activities modified independently demonstrating good safety awareness.   Time 4   Period Weeks   Status New   OT LONG TERM GOAL #7   Title Pt will demonstrate ability to perform a physical and cognitive task simultaneously with 90% accuracy, without LOB.   Time 4   Period Weeks   Status New   OT LONG TERM GOAL #8   Title Pt will perform mod complex environmental scanning with 90% or better accuracy without loss of balance/ unsteadiness or walking too close to the wall.   Time 4   Period Weeks   Status New               Plan - 09/13/15 1003    Clinical Impression Statement Pt demonstrated improved visual scanning today yet she continues to demonstrate decreased spatial awareness and balance. Pt can benefit from continued skilled occupational therapy to address cognitive and visuaospatial deficita as well as strength and endurance.   Pt will benefit from skilled therapeutic intervention in order to improve on the following deficits (Retired) Decreased coordination;Decreased range of motion;Difficulty walking;Decreased endurance;Decreased safety awareness;Impaired sensation;Decreased knowledge of precautions;Decreased activity tolerance;Decreased balance;Decreased knowledge of use of DME;Impaired UE functional use;Impaired vision/preception;Impaired perceived functional ability;Decreased strength;Decreased mobility;Decreased cognition   OT Frequency 2x / week   OT Duration 4 weeks   OT Treatment/Interventions Self-care/ADL training;Therapeutic exercise;Patient/family education;Functional Mobility Training;Balance training;Manual Therapy;Neuromuscular education;Ultrasound;Energy conservation;Therapeutic exercises;Therapeutic activities;DME and/or AE instruction;Parrafin;Cryotherapy;Electrical Stimulation;Fluidtherapy;Cognitive remediation/compensation;Visual/perceptual  remediation/compensation;Passive range of motion;Contrast Bath;Moist Heat   Plan renew, continue to address divided attention, strength and visual perceptual/ spatial skills.   OT Home Exercise Plan issued : coordination 07/18/15, theraband 07/20/15 red, upgraded to green on 08/15/15   Consulted and Agree with Plan of Care Patient        Problem List Patient Active Problem List   Diagnosis Date Noted  . Cerebrovascular accident (CVA) due to thrombosis of left middle cerebral artery ([place]) 09/07/2015  . Tobacco use disorder 09/07/2015  . Cerebral infarction due to thrombosis of left middle cerebral artery ([place])   . Stroke ([place]) 06/19/2015  . Essential hypertension 06/19/2015  . Hypokalemia 06/19/2015  . Tobacco abuse 06/19/2015    Kaleah Hagemeister 09/14/2015, 12:54 PM Theone Murdoch, OTR/L [phone] Phone: [phone] 12:54 PM 09/14/2015 [facility] [address] [place], [place], [address] Phone: [phone]   Fax:  [place]  Name: Catherine Floyd MRN: [id number] Date of Birth: [birth year]

## 2015-09-20 ENCOUNTER — Encounter: Payer: BLUE CROSS/BLUE SHIELD | Admitting: Occupational Therapy

## 2015-09-20 ENCOUNTER — Ambulatory Visit: Payer: BLUE CROSS/BLUE SHIELD | Admitting: Physical Therapy

## 2015-09-21 ENCOUNTER — Telehealth: Payer: Self-pay

## 2015-09-21 NOTE — Telephone Encounter (Signed)
Rn receive fax from [employer] who does disability for [employer]. Rn return form and fax to company regarding pts limited restrictions. Rn talk to Verlee MonteDora who is customer service rept. The patients claim manager Marti SleighShanan Less was not available. Rn explain that the form submitted was filled out according to office notes examination. Verlee MonteDora stated they need to know why she cant do the current job. Rn stated patient is currently in rehab for PT and OT,and they would have more detail notes. Rn gave name of therapy place, with contact number and fax number to get pts therapy notes.

## 2015-09-22 ENCOUNTER — Encounter: Payer: BLUE CROSS/BLUE SHIELD | Admitting: Occupational Therapy

## 2015-09-22 ENCOUNTER — Ambulatory Visit: Payer: BLUE CROSS/BLUE SHIELD | Admitting: Rehabilitation

## 2015-09-25 ENCOUNTER — Encounter: Payer: Self-pay | Admitting: Rehabilitation

## 2015-09-25 ENCOUNTER — Ambulatory Visit: Payer: BLUE CROSS/BLUE SHIELD | Admitting: Rehabilitation

## 2015-09-25 DIAGNOSIS — R41842 Visuospatial deficit: Secondary | ICD-10-CM | POA: Diagnosis not present

## 2015-09-25 DIAGNOSIS — R2681 Unsteadiness on feet: Secondary | ICD-10-CM

## 2015-09-25 DIAGNOSIS — R269 Unspecified abnormalities of gait and mobility: Secondary | ICD-10-CM

## 2015-09-25 DIAGNOSIS — R278 Other lack of coordination: Secondary | ICD-10-CM

## 2015-09-25 DIAGNOSIS — R279 Unspecified lack of coordination: Secondary | ICD-10-CM

## 2015-09-25 NOTE — Patient Instructions (Signed)
SINGLE LIMB STANCE   Stand in corner with chair in front of you for support as needed.   Stance: single leg on floor. Raise leg. Hold _15__ seconds. Repeat with other leg. Do both legs.   __4_ reps per set, __2_ sets per day, _5__ days per week  Copyright  VHI. All rights reserved.
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
## 2015-09-26 ENCOUNTER — Ambulatory Visit: Payer: BLUE CROSS/BLUE SHIELD | Admitting: Physical Therapy

## 2015-09-26 ENCOUNTER — Ambulatory Visit: Payer: BLUE CROSS/BLUE SHIELD | Admitting: Occupational Therapy

## 2015-09-26 DIAGNOSIS — IMO0002 Reserved for concepts with insufficient information to code with codable children: Secondary | ICD-10-CM

## 2015-09-26 DIAGNOSIS — R41842 Visuospatial deficit: Secondary | ICD-10-CM | POA: Diagnosis not present

## 2015-09-26 DIAGNOSIS — R2681 Unsteadiness on feet: Secondary | ICD-10-CM

## 2015-09-26 DIAGNOSIS — I69319 Unspecified symptoms and signs involving cognitive functions following cerebral infarction: Secondary | ICD-10-CM

## 2015-09-26 DIAGNOSIS — R269 Unspecified abnormalities of gait and mobility: Secondary | ICD-10-CM

## 2015-09-26 NOTE — Telephone Encounter (Signed)
[facility] office note [facility] to [facility] per their request. See phone note 11-11-10-2014.

## 2015-09-26 NOTE — Therapy (Signed)
[facility] [address] [place], [place], [address] Phone: [phone]   Fax:  [phone]  Occupational Therapy Treatment  Patient Details  Name: Catherine Floyd MRN: [id number] Date of Birth: 10/15/1951 No Data Recorded  Encounter Date: 09/26/2015      OT End of Session - 09/26/15 1446    Visit Number 18   Number of Visits 25   Date for OT Re-Evaluation 10/13/15   Authorization Type [employer] primary (20 visit limit),  Medicare secondary (g-code)   Authorization Time Period week 1/4   Authorization - Visit Number 17   Authorization - Number of Visits 20   OT Start Time 1408   OT Stop Time 1448   OT Time Calculation (min) 40 min      Past Medical History  Diagnosis Date  . Hypertension   . Cigarette nicotine dependence     Past Surgical History  Procedure Laterality Date  . Abdominal hysterectomy    . Appendectomy    . Tubal ligation      There were no vitals filed for this visit.  Visit Diagnosis:  Cognitive deficits following cerebral infarction  Visuospatial deficit  Weakness due to cerebrovascular accident  Unsteadiness      Treatment: Pt reports she is returning to work next week in a different position. Therapist reinforced that this was positive, as pt does not have to climb a ladder or use box cutter in new position. Pt continues to demonstrate decreased divided / alternating attention and therefore could be at increased risk for injury performing her previous job requiring these activities. Cooking task to follow instructions to make a box rice mix, pt required min v.c. for following instructions.(Pt was noted to jump ahead in the steps, and she requires v.c to slow down.) Therapist reinforced importance of slowing down with activities and reading all directions before proceeding. Environmental scanning task, pt missed 2/13 items. Pt located remaining items on second pass. Pt  demonstrated improved balance and improved ability to walk in the middle of hallway(instead of bumping into wall). Divided attention task for physical and cognitive task, min v.c. And minimall unsteadiness today.                        OT Short Term Goals - 08/18/15 0939    OT SHORT TERM GOAL #1   Title I with HEP.   Baseline Pt returns demonstration of red theraband   Status Achieved   OT SHORT TERM GOAL #2   Title Pt will demonstrate ability to perform basic cooking task modified independently demonstrating good safety awareness.   Status Achieved   OT SHORT TERM GOAL #3   Title Pt will improve RUE fine motor coordiantion as evidenced by decreasing  9 hole peg test score to 30 secs without drops.   Baseline 29.31 secs   Status Achieved           OT Long Term Goals - 09/14/15 1241    OT LONG TERM GOAL #1   Title Pt will demonstrate ability to perform mod complex cooking( 2 items simultaneously) modified independently demonstrating good safety awareness.     target date-10/12/15   Baseline not met, pt burned food cooking at home    Time 4   Status On-going   OT LONG TERM GOAL #2   Title Pt will perform simulated work activities at a modified independent level including placing items that ar 5 lbs on over  head shelf with RUE without drops.   Baseline achieved but pt reports concerns over dropping   Time 4   Status Achieved   OT LONG TERM GOAL #3   Title Pt will demonstrate ability to carry 20 lbs, 25 ft x 3 without rest break.   Baseline Pt carried 20 lbs x 100 ft x 3, with rest in between using bilateral UE's   Time 4   Period Weeks   Status Achieved   OT LONG TERM GOAL #4   Title Pt will demonstrate improved visual perceptual skills as evidenced by copying a simple design with 95% or better accuracy.   Baseline met for peg design, 95% accuracy with copying house, pt was able to draw correctly second trial   Status Achieved   OT LONG TERM GOAL #5   Title  Pt will use RUE as her dominant hand at least 90% of the time for ADLs/ Functional activities.   Status Achieved   Long Term Additional Goals   Additional Long Term Goals Yes   OT LONG TERM GOAL #6   Title Pt will perform simulated work activities modified independently demonstrating good safety awareness.   Time 4   Period Weeks   Status New   OT LONG TERM GOAL #7   Title Pt will demonstrate ability to perform a physical and cognitive task simultaneously with 90% accuracy, without LOB.   Time 4   Period Weeks   Status New   OT LONG TERM GOAL #8   Title Pt will perform mod complex environmental scanning with 90% or better accuracy without loss of balance/ unsteadiness or walking too close to the wall.   Time 4   Period Weeks   Status New               Plan - 09/26/15 1616    Clinical Impression Statement Pt reports she is returning to work next week in a different position that does not require use of box cutter and    Pt will benefit from skilled therapeutic intervention in order to improve on the following deficits (Retired) Decreased coordination;Decreased range of motion;Difficulty walking;Decreased endurance;Decreased safety awareness;Impaired sensation;Decreased knowledge of precautions;Decreased activity tolerance;Decreased balance;Decreased knowledge of use of DME;Impaired UE functional use;Impaired vision/preception;Impaired perceived functional ability;Decreased strength;Decreased mobility;Decreased cognition   Rehab Potential Good   OT Frequency 2x / week   OT Duration 4 weeks   OT Treatment/Interventions Self-care/ADL training;Therapeutic exercise;Patient/family education;Functional Mobility Training;Balance training;Manual Therapy;Neuromuscular education;Ultrasound;Energy conservation;Therapeutic exercises;Therapeutic activities;DME and/or AE instruction;Parrafin;Cryotherapy;Electrical Stimulation;Fluidtherapy;Cognitive remediation/compensation;Visual/perceptual  remediation/compensation;Passive range of motion;Contrast Bath;Moist Heat   Plan continue to address divided attention and visuospatial skills. anticipate d/c from OT next week as pt is returning to work   OT Home Exercise Plan issued : coordination 07/18/15, theraband 07/20/15 red, upgraded to green on 08/15/15   Consulted and Agree with Plan of Care Patient        Problem List Patient Active Problem List   Diagnosis Date Noted  . Cerebrovascular accident (CVA) due to thrombosis of left middle cerebral artery ([place]) 09/07/2015  . Tobacco use disorder 09/07/2015  . Cerebral infarction due to thrombosis of left middle cerebral artery ([place])   . Stroke ([place]) 06/19/2015  . Essential hypertension 06/19/2015  . Hypokalemia 06/19/2015  . Tobacco abuse 06/19/2015    Bertran Zeimet 09/26/2015, 4:19 PM Theone Murdoch, OTR/L [phone] Phone: [phone] 4:20 PM 09/26/2015 [facility] [address] [place], [place], [address] Phone: [phone]   Fax:  [place]  Name: Catherine Floyd MRN: [id number] Date of Birth: [birth year]

## 2015-09-26 NOTE — Therapy (Signed)
[facility] [address] [place], [place], [address] Phone: [phone]   Fax:  [phone]  Physical Therapy Treatment  Patient Details  Name: Catherine Floyd MRN: [id number] Date of Birth: 1951-04-18 No Data Recorded  Encounter Date: 09/26/2015      PT End of Session - 09/26/15 1721    Visit Number 17   Number of Visits 25   Date for PT Re-Evaluation 10/05/15   Authorization Type Medicare - G Codes every 10 vists   PT Start Time 1450   PT Stop Time 1513   PT Time Calculation (min) 23 min   Activity Tolerance Patient tolerated treatment well   Behavior During Therapy [facility] for tasks assessed/performed      Past Medical History  Diagnosis Date  . Hypertension   . Cigarette nicotine dependence     Past Surgical History  Procedure Laterality Date  . Abdominal hysterectomy    . Appendectomy    . Tubal ligation      There were no vitals filed for this visit.  Visit Diagnosis:  Abnormality of gait  Unsteadiness      Subjective Assessment - 09/26/15 1451    Subjective Pt starts back to work next week. Reports no falls, no significant changes. Feels she is ready to be discharged from PT.   Pertinent History Cerebral infarction due to thrombosis of left middle cerebral artery (06/19/15), hypokalemia, HTN, tobacco abuse   Limitations Walking   Patient Stated Goals "I want my balance back."   Currently in Pain? No/denies            [facility] PT Assessment - 09/26/15 0001    Observation/Other Assessments   Activities of Balance Confidence Scale (ABC Scale)  100%                     [facility] Adult PT Treatment/Exercise - 09/26/15 0001    Ambulation/Gait   Ambulation/Gait Yes   Ambulation/Gait Assistance 6: Modified independent (Device/Increase time)   Ambulation/Gait Assistance Details During outdoor gait trial, oted improved pt awareness of distance between pt and nearby cars, between pt  and edge of sidewalk. Noted no episodes of LE scissoring.   Ambulation Distance (Feet) 1150 Feet   Assistive device None   Gait Pattern Step-through pattern;Narrow base of support   Ambulation Surface Level;Unlevel;Indoor;Outdoor;Paved   Gait velocity 3.57 ft/sec   Standardized Balance Assessment   Standardized Balance Assessment Timed Up and Go Test   Timed Up and Go Test   TUG Normal TUG;Cognitive TUG   Normal TUG (seconds) 6.47   Cognitive TUG (seconds) 6.87  while naming foods that start with the letter "A"                PT Education - 09/26/15 1720    Education provided Yes   Education Details Goals, findings, progress, and DC plan.   Person(s) Educated Patient   Methods Explanation   Comprehension Verbalized understanding          PT Short Term Goals - 09/26/15 1510    PT SHORT TERM GOAL #1   Title Pt will perform initial home exercise program with mod I using paper handout to indicate safe HEP compliance, to maximize functional gains. Target date: 08/10/15.   Baseline Met 10/6.   Status Achieved   PT SHORT TERM GOAL #2   Title Pt will increase self-selected gait speed from 3.09 ft/sec to 3.39 ft/sec to indicate increased efficiency of ambulation. Target  date: 08/10/15.   Baseline 09/27/23: gait velocity = 3.57 ft/sec   Status Achieved   PT SHORT TERM GOAL #3   Title Pt will increase FGA score 15/30 to 19/30 to indicate improved dynamic gait stability. Target date: 08/10/15.   Baseline 10/4: FGA score = 20/30   Status Achieved   PT SHORT TERM GOAL #4   Title Pt will independently negotiate standard ramp and curb step to indicate pt safety traversing community obstacles. Target date: 08/10/15.   Status Achieved           PT Long Term Goals - 09/27/15 1511    PT LONG TERM GOAL #1   Title Pt will verbalize understanding of fall prevention strategies to decrease risk of falling in home environment. Target date: 09/07/15.   Baseline Met 11/1.   Status Achieved    PT LONG TERM GOAL #2   Title Patient will increase FGA score from 15 to 23/30 to indicate decreased risk of falling.  Target date: 09/07/15.   Baseline 11/1: FGA score = 24/30   Status Achieved   PT LONG TERM GOAL #3   Title Pt will independently ambulate 1,000' over unlevel paved and grass surfaces with no overt LOB to indicate increased safety with community mobility. Modified target date: 10/05/15   Baseline 09/27/23: Met, as pt ambulated x1150' over outdoor, paved surfaces with mod I, increased time; no near-collision with objects on R side.  Continue goal through renewed POC.   Status Achieved   PT LONG TERM GOAL #4   Title Pt will verbalize understanding of fall prevention strategies to decrease risk of falling in home environment.  Target date: 09/07/15.   Baseline LTG #1 duplicated. See above.   Status Deferred   PT LONG TERM GOAL #5   Title Pt will verbalize understanding of CVA warning signs, pertinent risk factors to prevent future CVA. Modified target date: 10/05/15   Baseline Met September 27, 2023.  Continue goal through renewed POC.   Status Achieved   PT LONG TERM GOAL #6   Title Pt will increase Activities-Specific Balance Confidence Scale score from 46.3% to >/= 57% to indicate increased balance confidence. Modified target date: 10/05/15   Baseline Sep 27, 2023: Met with ABC score of 100%.   Status Achieved   PT LONG TERM GOAL #7   Title Pt will perform Cognitive TUG with no overt LOB in time no more than 10% higher than Normal TUG to indicate increased gait stability with dual tasking. Target date:  10/05/15   Baseline Met 09/27/23.   Status Achieved               Plan - 09/27/15 1726    Clinical Impression Statement Pt has met all short and long term goals and therefore will be discharged from outpatient PT at this time. Pt educated on goals, findings, progress, and DC plan. Pt verbalized understanding and was in full agreement with DC plan.   Pt will benefit from skilled therapeutic  intervention in order to improve on the following deficits Abnormal gait;Decreased activity tolerance;Decreased balance;Decreased endurance;Decreased coordination;Decreased safety awareness;Dizziness;Impaired sensation;Decreased cognition;Decreased strength   Consulted and Agree with Plan of Care Patient          G-Codes - 27-Sep-2015 1728    Functional Assessment Tool Used FGA 24/30   Functional Limitation Mobility: Walking and moving around   Mobility: Walking and Moving Around Goal Status [phone]) At least 1 percent but less than 20 percent impaired, limited or restricted   Mobility: Walking  and Moving Around Discharge Status [phone]) At least 1 percent but less than 20 percent impaired, limited or restricted      PHYSICAL THERAPY DISCHARGE SUMMARY  Visits from Start of Care: 17  Current functional level related to goals / functional outcomes: See goals above.    Remaining deficits: Visuospatial impairment    Education / Equipment: HEP; CVA warning signs and risk factors Plan: Patient agrees to discharge.  Patient goals were met. Patient is being discharged due to meeting the stated rehab goals.  ?????       Problem List Patient Active Problem List   Diagnosis Date Noted  . Cerebrovascular accident (CVA) due to thrombosis of left middle cerebral artery ([place]) 09/07/2015  . Tobacco use disorder 09/07/2015  . Cerebral infarction due to thrombosis of left middle cerebral artery ([place])   . Stroke ([place]) 06/19/2015  . Essential hypertension 06/19/2015  . Hypokalemia 06/19/2015  . Tobacco abuse 06/19/2015    Billie Ruddy, PT, DPT [facility] [address] [place], [place], [address] Phone: [phone]   Fax:  [phone] 09/26/2015, 5:29 PM  Name: Catherine Floyd MRN: [id number] Date of Birth: [birth year]

## 2015-09-27 ENCOUNTER — Other Ambulatory Visit: Payer: Self-pay | Admitting: *Deleted

## 2015-09-27 NOTE — Patient Outreach (Signed)
[facility] Care Management  09/27/2015  Ivor CostaMargaret B Chokshi Jul 27, 1951 [id number]  No response to 3 follow up call attempts to patient.  Plan: will close out EMMI-stroke outreach calls.  Sent close out to care management assistant. Colleen CanLinda Yari Szeliga, RN BSN CCM Care Management Coordinator [facility]  [phone]

## 2015-10-03 ENCOUNTER — Ambulatory Visit: Payer: BLUE CROSS/BLUE SHIELD

## 2015-10-03 ENCOUNTER — Ambulatory Visit: Payer: BLUE CROSS/BLUE SHIELD | Admitting: Occupational Therapy

## 2015-10-03 ENCOUNTER — Ambulatory Visit: Payer: BLUE CROSS/BLUE SHIELD | Admitting: Rehabilitation

## 2015-10-03 DIAGNOSIS — R41842 Visuospatial deficit: Secondary | ICD-10-CM

## 2015-10-03 DIAGNOSIS — IMO0002 Reserved for concepts with insufficient information to code with codable children: Secondary | ICD-10-CM

## 2015-10-03 DIAGNOSIS — I69319 Unspecified symptoms and signs involving cognitive functions following cerebral infarction: Secondary | ICD-10-CM

## 2015-10-03 DIAGNOSIS — R2681 Unsteadiness on feet: Secondary | ICD-10-CM

## 2015-10-03 NOTE — Therapy (Signed)
[facility] [address] [place], [place], [address] Phone: [phone]   Fax:  [phone]  Speech Language Pathology Treatment  Patient Details  Name: Catherine Floyd MRN: [id number] Date of Birth: 1951-02-03 No Data Recorded  Encounter Date: 10/03/2015      End of Session - 10/03/15 1220    Visit Number 11   Number of Visits 16   Date for SLP Re-Evaluation 10/03/15   SLP Start Time 5462   SLP Stop Time  1216   SLP Time Calculation (min) 31 min   Activity Tolerance Patient tolerated treatment well      Past Medical History  Diagnosis Date  . Hypertension   . Cigarette nicotine dependence     Past Surgical History  Procedure Laterality Date  . Abdominal hysterectomy    . Appendectomy    . Tubal ligation      There were no vitals filed for this visit.  Visit Diagnosis: Cerebrovascular accident with cognitive communication deficit      Subjective Assessment - 10/03/15 1151    Subjective Pt tells SLP she was d/c'd from OT today. "I really don't want to just come in for speech." "When they call me to come back I have to be ready."               ADULT SLP TREATMENT - 10/03/15 1151    General Information   Behavior/Cognition Cooperative;Alert;Pleasant mood   Treatment Provided   Treatment provided Cognitive-Linquistic   Pain Assessment   Pain Assessment No/denies pain   Cognitive-Linquistic Treatment   Treatment focused on Cognition   Skilled Treatment See goal update. Pt's divided attention with cognitive-linguistics appears not to have improved to a notable degree since last speech sessions. She req'd SLP A to correct after she completed a task. Pt did not want to come into therapy just for ST, as she stated when they called her to return to work, she did not want to have anything on her schedule to be able to return at any time they wanted her to return.   Assessment / Recommendations /  Plan   Plan Discharge SLP treatment due to (comment)  pt request, due to work schedule   Progression Toward Goals   Progression toward goals --  pt discharged today due to her request re: back to work ASAP          SLP Education - 10/03/15 1219    Education provided Yes   Education Details compensations for cognitive -linguistics   Person(s) Educated Patient   Methods Explanation   Comprehension Verbalized understanding          SLP Short Term Goals - 08/21/15 0942    SLP SHORT TERM GOAL #1   Title Pt will alternate attention between 2 cognitive linguistic activities with 85% on each and rare min A   Time 1   Period Weeks   Status Achieved   SLP SHORT TERM GOAL #2   Title Pt will attend details in moderately complex cogntive linguistic tasks with occasional min A and 85% accuracy   Status Not Met   SLP SHORT TERM GOAL #3   Title Pt will complete mildly complex executive function tasks (planning, organizing, analyzing) with 85% accuracy and rare min A   Status Achieved  08-18-15          SLP Long Term Goals - 10/03/15 1201    SLP LONG TERM GOAL #1   Title Pt will  divide attention between 2 simple cognitive attention tasks with rare min A and 90% on each task.   Baseline occasional min A needed, but accuracy at 95%   Time 3   Period Weeks   Status Partially Met   SLP LONG TERM GOAL #2   Title Pt will complete moderately coomplex executive function tasks with 90% accuracy and rare min A   Time 3   Period Weeks   Status Deferred  could not assess due to time however pt reports she cooked Thanksgivng dinner herself and reports no difficulty with timer used   SLP LONG TERM GOAL #3   Title Pt will verbalize 3 compensations for reduced attention with rare min A   Time 3   Period Weeks   Status Not Met   SLP LONG TERM GOAL #4   Title Pt will self correct detailed linguistic tasks  (auditory, written, spoken) to produce work with 100% accuracy overall   Time 3    Period Weeks   Status Not Met  only with one visit to meet this goal          Plan - 10/03/15 1254    Clinical Impression Statement In this shortened session today, pt's cognitive linguistic skills look primarily unchaged from previous sessions. Pt would like to cancel remaining sessions and d/c today due to return to work. See goal update. Pt cont to have difficulties in higher level cognitive-linguistic areas of attention, awareness, and attention to detail.   Speech Therapy Frequency 2x / week   Duration --  3 weeks   Treatment/Interventions Compensatory techniques;Internal/external aids;SLP instruction and feedback;Cognitive reorganization;Functional tasks;Patient/family education   Potential to Achieve Goals Good   Potential Considerations Ability to learn/carryover information;Severity of impairments     SPEECH THERAPY DISCHARGE SUMMARY  Visits from Start of Care: 11  Current functional level related to goals / functional outcomes: See goal summary above. Pt requests discharge today due to desire to have a clear schedule when her employer calls. Pt and SLP talked about compensations for reduced divided attention and her reduced attention to detail and how it may impact her performance at work. Pt was able to verbally provide adequate and feasible solutions for situations where she may falter due to her deficits.   Remaining deficits: Pt currently cont to demo difficulty with divided attention, attention to detail. SLP believes her verbal safety awareness is good but is unsure how pt would respond in the moment when faced with a decision to refrain from doing something she has said might be unsafe.   Education / Equipment: Compensations for attention and reduced attention to detail.  Plan: Patient agrees to discharge.  Patient goals were partially met. Patient is being discharged due to the patient's request.  ?????        Problem List Patient Active Problem List    Diagnosis Date Noted  . Cerebrovascular accident (CVA) due to thrombosis of left middle cerebral artery ([place]) 09/07/2015  . Tobacco use disorder 09/07/2015  . Cerebral infarction due to thrombosis of left middle cerebral artery ([place])   . Stroke ([place]) 06/19/2015  . Essential hypertension 06/19/2015  . Hypokalemia 06/19/2015  . Tobacco abuse 06/19/2015    [facility] , MS, [place]  10/04/2015, 1:50 PM  [facility] [address] [place], [place], [address] Phone: [phone]   Fax:  [phone]   Name: Catherine Floyd MRN: [id number] Date of Birth: [birth year]

## 2015-10-03 NOTE — Patient Instructions (Signed)
Try to make divided attention tasks "back and forth" attention tasks instead, and/or slow down your work pace to ensure accuracy Always double check detailed work. Follow the precautions Samara DeistKAthryn has talked about.

## 2015-10-04 NOTE — Therapy (Addendum)
[facility] [address] [place], [place], [address] Phone: [phone]   Fax:  [phone]  Occupational Therapy Treatment  Patient Details  Name: Catherine Floyd MRN: [id number] Date of Birth: 01/15/1951 No Data Recorded  Encounter Date: 10/03/2015      OT End of Session - 10/03/15 1122    Visit Number 19   Number of Visits 25   Authorization Type BCBS primary (20 visit limit),  Medicare secondary (g-code)   Authorization Time Period week 1/4      Past Medical History  Diagnosis Date  . Hypertension   . Cigarette nicotine dependence     Past Surgical History  Procedure Laterality Date  . Abdominal hysterectomy    . Appendectomy    . Tubal ligation      There were no vitals filed for this visit.  Visit Diagnosis:  Cognitive deficits following cerebral infarction  Visuospatial deficit  Weakness due to cerebrovascular accident  Unsteadiness      Subjective Assessment - 10/04/15 1423    Subjective  Pt reports she is going back to work next week she thinks.   Pertinent History see Epic   Patient Stated Goals to use RUE        Treatment: Therapist checked progress towards goals. Environmental scanning task: 11/13 items located on first pass. Pt located remaining items on second pass, no LOB or bumping into walls noted. Divided attention task for basic physical and cognitive task simultaneously, 90% accuracy. Reviewed green theraband HEP, 10-15 reps each exercise. Pt returned demonstration. Pt agrees with plan to d/c from therapy as she is returning to work soon.   Therapist  recommends that pt concentrates on 1 task at a time when she returns to work rather than trying to divide her attention between multiple activities.                        OT Short Term Goals - 08/18/15 0939    OT SHORT TERM GOAL #1   Title I with HEP.   Baseline Pt returns demonstration of red  theraband   Status Achieved   OT SHORT TERM GOAL #2   Title Pt will demonstrate ability to perform basic cooking task modified independently demonstrating good safety awareness.   Status Achieved   OT SHORT TERM GOAL #3   Title Pt will improve RUE fine motor coordiantion as evidenced by decreasing  9 hole peg test score to 30 secs without drops.   Baseline 29.31 secs   Status Achieved           OT Long Term Goals - 10/03/15 1108    OT LONG TERM GOAL #1   Title Pt will demonstrate ability to perform mod complex cooking( 2 items simultaneously) modified independently demonstrating good safety awareness.        Baseline Pt reports she cookied thanskgiving dinner and kept up with 2 or more items. And did not burn anything.   Status Achieved   OT LONG TERM GOAL #2   Title Pt will perform simulated work activities at a modified independent level including placing items that ar 5 lbs on over head shelf with RUE without drops.   Status Achieved   OT LONG TERM GOAL #3   Title Pt will demonstrate ability to carry 20 lbs, 25 ft x 3 without rest break.   Status Achieved   OT LONG TERM GOAL #4   Title Pt will demonstrate  improved visual perceptual skills as evidenced by copying a simple design with 95% or better accuracy.   Status Achieved   OT LONG TERM GOAL #5   Title Pt will use RUE as her dominant hand at least 90% of the time for ADLs/ Functional activities.   Status Achieved   OT LONG TERM GOAL #6   Title Pt will perform simulated work activities modified independently demonstrating good safety awareness.   Baseline Pt demonstrates ability to fold clothes and carry lightweight basket of clothes, pt will be starting new job that does not require her to use utility knife and does not require use of a ladder.   Status Achieved   OT LONG TERM GOAL #7   Title Pt will demonstrate ability to perform a physical and cognitive task simultaneously with 90% accuracy, without LOB.   Status Achieved    OT LONG TERM GOAL #8   Title Pt will perform mod complex environmental scanning with 90% or better accuracy without loss of balance/ unsteadiness or walking too close to the wall.   Baseline 85% improved,and does not walk too lose / bump into wall, 11/13 items located first pass.   Status Partially Met               Plan - 10/04/15 1430    Clinical Impression Statement Pt reports she is returing to work soon possibly next week. Pt reports her new job will not require her to use a box cutter or climb ladders. Therapist is in agreement that the modified job responsibilities will be better at this time.   Pt will benefit from skilled therapeutic intervention in order to improve on the following deficits (Retired) Decreased coordination;Decreased range of motion;Difficulty walking;Decreased endurance;Decreased safety awareness;Impaired sensation;Decreased knowledge of precautions;Decreased activity tolerance;Decreased balance;Decreased knowledge of use of DME;Impaired UE functional use;Impaired vision/preception;Impaired perceived functional ability;Decreased strength;Decreased mobility;Decreased cognition   Rehab Potential Good   OT Frequency 2x / week   OT Duration 4 weeks   OT Treatment/Interventions Self-care/ADL training;Therapeutic exercise;Patient/family education;Functional Mobility Training;Balance training;Manual Therapy;Neuromuscular education;Ultrasound;Energy conservation;Therapeutic exercises;Therapeutic activities;DME and/or AE instruction;Parrafin;Cryotherapy;Electrical Stimulation;Fluidtherapy;Cognitive remediation/compensation;Visual/perceptual remediation/compensation;Passive range of motion;Contrast Bath;Moist Heat   Plan discharge from occupational therapy   OT Home Exercise Plan issued : coordination 07/18/15, theraband 07/20/15 red, upgraded to green on 08/15/15   Consulted and Agree with Plan of Care Patient          G-Codes - 10-21-2015 1143    Functional Assessment  Tool Used based on clinical judgement Pt demonstrates improved functional use of RUE, she demonstrates ability to retrieve 5lbs object from overhead shelf with RUE x 5 without dropping.   Functional Limitation Carrying, moving and handling objects   Carrying, Moving and Handling Objects Goal Status [phone]) At least 1 percent but less than 20 percent impaired, limited or restricted   Carrying, Moving and Handling Objects Discharge Status [phone]) At least 1 percent but less than 20 percent impaired, limited or restricted      Problem List Patient Active Problem List   Diagnosis Date Noted  . Cerebrovascular accident (CVA) due to thrombosis of left middle cerebral artery ([place]) 09/07/2015  . Tobacco use disorder 09/07/2015  . Cerebral infarction due to thrombosis of left middle cerebral artery ([place])   . Stroke ([place]) 06/19/2015  . Essential hypertension 06/19/2015  . Hypokalemia 06/19/2015  . Tobacco abuse 06/19/2015   OCCUPATIONAL THERAPY DISCHARGE SUMMARY    Current functional level related to goals / functional outcomes: Pt met 7/8 long term goals. Despite  pt's report that she was able to cook multiple items without burning at home, pt demonstrates continued decreased divided attention particularly in unfamiliar environments, or with new task Remaining deficits: Pt demonstrates continued mild visual perceptual deficits ( as demonstrated by missing 2/13 items during environmental scanning in the clinic. Pt will need to proceed slowly  and use an organized scanning pattern when she returns to work. Pt verbalizes understanding. Pt continues to demonstrate decreased divided and alternating attention, as a result therapist does not recommend that pt performs tasks such as climbing a ladder or using a box cutter intially when returning to work as she may become distracted and be at risk for injury.   Education / Equipment: Pt was educated regarding visual perceptual deficits  And use of  organized scan pattern and ways to compensate for attentional deficits. Pt was also educated regarding HEP. Pt verbalized understanding of all education. Plan: Patient agrees to discharge.  Patient goals were not met. Patient is being discharged due to meeting the stated rehab goals.  ?????     , 10/04/2015, 2:40 PM Theone Murdoch, OTR/L [phone] Phone: [phone] 2:40 PM 10/04/2015 [facility] [address] [place], [place], [address] Phone: [phone]   Fax:  [phone]  Name: Catherine Floyd MRN: [id number] Date of Birth: 1951/02/24

## 2015-10-04 NOTE — Telephone Encounter (Signed)
Rn call Marchelle Folksmanda at [employer] about patients disability form and her restrictions for work. Rn stated a call was made to [facility] on 09-11-15 about the pts work restrictions. Per Dr. Roda ShuttersXu  Office note pt can return to work on light duty. Rn explain that she told the previous rept to call Neuro rehab to get their office notes and suggestion for PT and OT. Rn gave Marchelle Folksmanda from [employer] the contact number and fax number for pts therapist. Rn explain that we are not allowed to send other office notes,and they would have to request it from rehab.

## 2015-10-04 NOTE — Telephone Encounter (Signed)
Phone call from [employer] [phone] states only deficit, stumbled when she turned around, needs rationale for restrictions, findings on exam that prevent return to work full time? Restrictions for return to work full capacity? How often seen by physical therapy? And anticipated date to be done with physical therapy? Claim# [id number].

## 2015-10-05 ENCOUNTER — Telehealth: Payer: Self-pay | Admitting: Neurology

## 2015-10-05 ENCOUNTER — Ambulatory Visit: Payer: BLUE CROSS/BLUE SHIELD | Admitting: Rehabilitation

## 2015-10-05 ENCOUNTER — Encounter: Payer: BLUE CROSS/BLUE SHIELD | Admitting: Occupational Therapy

## 2015-10-05 ENCOUNTER — Encounter: Payer: BLUE CROSS/BLUE SHIELD | Admitting: Speech Pathology

## 2015-10-05 NOTE — Telephone Encounter (Signed)
Rn call patient back to state two phone calls have been made to [name] about her return to work and disability. Rn stated that [name] has the form per phone notes. Rn gave patient Catherine Folksmanda number claims.

## 2015-10-05 NOTE — Telephone Encounter (Signed)
Rn fax form on 09-11-15 and 09-26-15. Rn fax form again 10-05-15 to [employer].. Rn told patient the company has the form because they call back about referring to the doctor said she can return to light duty.

## 2015-10-05 NOTE — Telephone Encounter (Addendum)
Patient is calling. She needs to discuss a form that was sent from [employer] on 09-21-15 regarding the patient returning to work at [employer] and what the patient's limitations are. Please call and discuss. Thank you. See 09-21-15 notes.

## 2015-10-06 NOTE — Telephone Encounter (Signed)
Rn call patient back and she is back at work doing 4 hrs 5 days a week.Pt is on light duty with [employer]. Pt stated she did leave a message with [employer] about her check she did not receive. Pt is also going down next week to apply for social security because of her age, and medicare. Pt stated she thank us for helping her and will call for any issues.

## 2015-10-10 ENCOUNTER — Encounter: Payer: BLUE CROSS/BLUE SHIELD | Admitting: Occupational Therapy

## 2015-10-12 ENCOUNTER — Encounter: Payer: BLUE CROSS/BLUE SHIELD | Admitting: Occupational Therapy

## 2015-11-09 ENCOUNTER — Encounter: Payer: Self-pay | Admitting: *Deleted

## 2015-11-09 DIAGNOSIS — Z006 Encounter for examination for normal comparison and control in clinical research program: Secondary | ICD-10-CM

## 2015-11-09 NOTE — Progress Notes (Signed)
STROKE -AF Research study telephone call to request a signature for the [employer] HIPAA consent form. Patient very willing to sign consent that was inadvertly left off the initial Research form at the time of signing. ICF-HIPAA will be mailed to patient to sign and an envelope to return to research office will be supplied. Questions encouraged and answered.

## 2015-12-13 ENCOUNTER — Ambulatory Visit (INDEPENDENT_AMBULATORY_CARE_PROVIDER_SITE_OTHER): Payer: BLUE CROSS/BLUE SHIELD | Admitting: Neurology

## 2015-12-13 ENCOUNTER — Encounter: Payer: Self-pay | Admitting: Neurology

## 2015-12-13 VITALS — BP 139/76 | HR 66 | Ht 62.0 in | Wt 146.4 lb

## 2015-12-13 DIAGNOSIS — F17200 Nicotine dependence, unspecified, uncomplicated: Secondary | ICD-10-CM | POA: Diagnosis not present

## 2015-12-13 DIAGNOSIS — I10 Essential (primary) hypertension: Secondary | ICD-10-CM | POA: Diagnosis not present

## 2015-12-13 DIAGNOSIS — I63312 Cerebral infarction due to thrombosis of left middle cerebral artery: Secondary | ICD-10-CM

## 2015-12-13 NOTE — Patient Instructions (Signed)
-   Continue ASA and lipitor for stroke prevention - quit smoking - check BP at home and record - Follow up with your primary care physician for stroke risk factor modification. Recommend maintain blood pressure goal <130/80, diabetes with hemoglobin A1c goal below 6.5% and lipids with LDL cholesterol goal below 70 mg/dL.  - OK to go back to work full time. Will fill out the form. - follow up in 6 months.

## 2015-12-13 NOTE — Progress Notes (Signed)
STROKE NEUROLOGY FOLLOW UP NOTE  NAME: Catherine Floyd DOB: Jul 19, 1951  REASON FOR VISIT: stroke follow up HISTORY FROM: pt and chart  Today we had the pleasure of seeing Catherine Floyd in follow-up at our Neurology Clinic. Pt was accompanied by no one.   History Summary Ms. Catherine Floyd is a 65 y.o. female with history of HTN, and current smoker admitted on 06/19/15 for right side weakness. MRI showed left IC stroke. Symptoms gradually improving.MRA showed mild intracranial athero. Stroke work up negative including TTE, CUS, LDL and A1C. she was put on aspirin and Lipitor, and recommend to quit smoking. She was discharged in good condition  09/07/15 follow up - the patient has been doing well.  No recurrent strokelike symptoms. Still smoking, willing to quit. Blood pressure is still high at home 140-150, today in clinic in 166/85. She is on Norvasc 5, HCTZ 12.5 and doxazosin 4 mg. No significant neurological deficit. She is asking for going back to work.  Interval History During the interval time, pt has been doing well. Has been back to work part time 4 hours/day. Feels strong now and willing to go back to full time. Has not quit smoking yet. Finished PT/OT as outpt. BP better controlled now, today 139/76.  REVIEW OF SYSTEMS: Full 14 system review of systems performed and notable only for those listed below and in HPI above, all others are negative:  Constitutional:  Activity change, fatigue Cardiovascular:  Ear/Nose/Throat:   Skin:  Eyes:   Respiratory:   Gastroitestinal:   Genitourinary: painful urination, frequent urination Hematology/Lymphatic:   Endocrine:  Musculoskeletal:  Allergy/Immunology:   Neurological:  Memory loss Psychiatric:  Sleep:   The following represents the patient's updated allergies and side effects list: Allergies  Allergen Reactions  . Penicillins     Throat closes up   . Sulfa Antibiotics     Hives     The neurologically  relevant items on the patient's problem list were reviewed on today's visit.  Neurologic Examination  A problem focused neurological exam (12 or more points of the single system neurologic examination, vital signs counts as 1 point, cranial nerves count for 8 points) was performed.  Blood pressure 139/76, pulse 66, height  (1.575 m), weight 146 lb 6.4 oz (66.407 kg).  General - Well nourished, well developed, in no apparent distress.  Ophthalmologic - Sharp disc margins OU.   Cardiovascular - Regular rate and rhythm with no murmur.  Mental Status -  Level of arousal and orientation to time, place, and person were intact. Language including expression, naming, repetition, comprehension was assessed and found intact. Attention span and concentration were normal. Recent and remote memory were intact. Fund of Knowledge was assessed and was intact.  Cranial Nerves II - XII - II - Visual field intact OU. III, IV, VI - Extraocular movements intact. V - Facial sensation intact bilaterally. VII - Facial movement intact bilaterally. VIII - Hearing & vestibular intact bilaterally. X - Palate elevates symmetrically. XI - Chin turning & shoulder shrug intact bilaterally. XII - Tongue protrusion intact.  Motor Strength - The patient's strength was normal in all extremities and pronator drift was absent.  Bulk was normal and fasciculations were absent.   Motor Tone - Muscle tone was assessed at the neck and appendages and was normal.  Reflexes - The patient's reflexes were 1+ in all extremities and she had no pathological reflexes.  Sensory - Light touch, temperature/pinprick were assessed and were  normal.    Coordination - The patient had normal movements in the hands and feet with no ataxia or dysmetria.  Tremor was absent.  Gait and Station - normal gait  Assessment: mRS = 0  Data reviewed: I personally reviewed the images and agree with the radiology interpretations.  Dg  Chest 2 View 06/19/2015 IMPRESSION: No evidence of acute cardiopulmonary disease.   Ct Head Wo Contrast 06/19/2015 IMPRESSION: Mild ethmoid sinus disease. No intracranial mass, hemorrhage, or extra-axial fluid collection. No focal gray - white compartment lesions/acute appearing infarct.   Mri and Mra Brain Wo Contrast 06/19/2015 IMPRESSION: MRI HEAD: Acute subcentimeter infarct posterior limb of the LEFT internal capsule. Mild to moderate white matter changes compatible chronic small vessel ischemic disease. MRA HEAD: Accessory LEFT MCA (for purposes of this report described as proximal and distal), with focal high-grade stenosis of distal LEFT MCA M2 segment. Focal high-grade stenosis LEFT M3 segment. Multiple stenosis of the posterior circulation including focal high-grade stenosis LEFT P3 segment. No acute large vessel occlusion.  CUS - There is 1-39% bilateral ICA stenosis. Vertebral artery flow is antegrade.   2D Echocardiogram - Left ventricle: The cavity size was normal. Wall thickness wasnormal. Systolic function was normal. The estimated ejectionfraction was in the range of 55% to 60%. Wall motion was normal;there were no regional wall motion abnormalities. Dopplerparameters are consistent with abnormal left ventricularrelaxation (grade 1 diastolic dysfunction). - Aortic valve: There was no stenosis. - Mitral valve: There was trivial regurgitation. - Right ventricle: The cavity size was normal. Systolic functionwas normal. - Tricuspid valve: Peak RV-RA gradient (S): 19 mm Hg. - Pulmonary arteries: PA peak pressure: 22 mm Hg (S). - Inferior Catherine cava: The vessel was normal in size. Therespirophasic diameter changes were in the normal range (>= 50%),consistent with normal central venous pressure. Impressions: Normal LV size and systolic function, EF 55-60%. Normal RV size and systolic function. No significant valvular abnormalities.  Component     Latest Ref Rng  06/20/2015  Cholesterol     0 - 200 mg/dL 161  Triglycerides     <150 mg/dL 90  HDL Cholesterol     >40 mg/dL 31 (L)  Total CHOL/HDL Ratio      3.6  VLDL     0 - 40 mg/dL 18  LDL (calc)     0 - 99 mg/dL 63  Hemoglobin W9U     4.8 - 5.6 % 5.5  Mean Plasma Glucose      111    Assessment: As you may recall, she is a 65 y.o. African American female with PMH of HTN, and current smoker admitted on 06/19/15 for left IC stroke. Symptoms recovered well.MRA showed mild intracranial athero. Stroke work up negative including TTE, CUS, LDL and A1C. she was put on aspirin and Lipitor, and recommend to quit smoking. During the interval time, the patient has been doing well. Still smoking, have not quit yet. BP under control now.  Plan:  - Continue ASA and lipitor for stroke prevention - quit smoking - check BP at home and record - Follow up with your primary care physician for stroke risk factor modification. Recommend maintain blood pressure goal <130/80, diabetes with hemoglobin A1c goal below 6.5% and lipids with LDL cholesterol goal below 70 mg/dL.  - OK to go back to work full time. Will fill out the form. - follow up in 6 months.  I spent more than 25 minutes of face to face time with the patient. Greater  than 50% of time was spent in counseling and coordination of care. We discussed about quit smoking, BP management, and fill out the form for full time work.   No orders of the defined types were placed in this encounter.    Meds ordered this encounter  Medications  . escitalopram (LEXAPRO) 10 MG tablet    Sig:   . atorvastatin (LIPITOR) 80 MG tablet    Sig:     Patient Instructions  - Continue ASA and lipitor for stroke prevention - quit smoking - check BP at home and record - Follow up with your primary care physician for stroke risk factor modification. Recommend maintain blood pressure goal <130/80, diabetes with hemoglobin A1c goal below 6.5% and lipids with LDL cholesterol  goal below 70 mg/dL.  - OK to go back to work full time. Will fill out the form. - follow up in 6 months.    Marvel Plan, MD PhD [facility] [address], [address] [place], [place] [address] [phone]

## 2015-12-18 ENCOUNTER — Telehealth: Payer: Self-pay

## 2015-12-18 NOTE — Telephone Encounter (Signed)
LFt vm that return to work form completed by. Dr.Xu will be at the front desk.

## 2016-06-12 ENCOUNTER — Ambulatory Visit: Payer: BLUE CROSS/BLUE SHIELD | Admitting: Neurology

## 2016-08-14 ENCOUNTER — Encounter: Payer: Self-pay | Admitting: *Deleted

## 2016-08-14 DIAGNOSIS — Z006 Encounter for examination for normal comparison and control in clinical research program: Secondary | ICD-10-CM

## 2016-08-14 NOTE — Progress Notes (Signed)
STROKE-AF Research study Month 12 office visit completed. Updated Informed consent signed ( Version 3, Revised 08/11, IRB dated 17/FEB/2017). Copy of the Consent given to patient. No adverse events, No symptoms and No changes in medication noted. Next Research appointment due no later than 22/MAR/2018. Questions encouraged and answered.

## 2017-01-21 ENCOUNTER — Encounter: Payer: Self-pay | Admitting: *Deleted

## 2017-01-21 DIAGNOSIS — Z006 Encounter for examination for normal comparison and control in clinical research program: Secondary | ICD-10-CM

## 2017-01-21 NOTE — Progress Notes (Signed)
STROKE-AF Research study month 18 visit completed. Patient has not had any changes to medication of events. [facility] Research required visit is due no later than 19/SEP/2018.

## 2017-01-22 ENCOUNTER — Other Ambulatory Visit: Payer: Self-pay | Admitting: Physician Assistant

## 2017-01-22 DIAGNOSIS — Z1231 Encounter for screening mammogram for malignant neoplasm of breast: Secondary | ICD-10-CM

## 2017-01-28 ENCOUNTER — Other Ambulatory Visit: Payer: Self-pay | Admitting: Physician Assistant

## 2017-01-28 DIAGNOSIS — N644 Mastodynia: Secondary | ICD-10-CM

## 2017-07-03 ENCOUNTER — Encounter: Payer: Self-pay | Admitting: *Deleted

## 2017-07-03 DIAGNOSIS — Z006 Encounter for examination for normal comparison and control in clinical research program: Secondary | ICD-10-CM

## 2017-07-03 NOTE — Progress Notes (Signed)
STROKE-AF Research study month 24 follow up visit completed. Patient has not had any medication changes or hospital admissions since last research visit. Next research visit window is 26/JAN/2019----23/MAR/2019.

## 2017-09-09 ENCOUNTER — Telehealth: Payer: Self-pay | Admitting: *Deleted

## 2017-09-09 NOTE — Telephone Encounter (Addendum)
Spoke with patient about STROKE-AF research study updated ICF mailed to her. Informed her if she would like to continue please sign, date in marked areas and mail back to research office with provided envelope. All follow up visit will be completed via phone. Patient agrees and will sign and return.  Update: Received patient signed ICF (IRB dated 26/SEP/2018 Version 3) on 20/NOV/2018. Copy of ICF will be mailed to patient.

## 2017-09-10 ENCOUNTER — Encounter (HOSPITAL_COMMUNITY): Payer: Self-pay | Admitting: Emergency Medicine

## 2017-09-10 ENCOUNTER — Ambulatory Visit (HOSPITAL_COMMUNITY)
Admission: EM | Admit: 2017-09-10 | Discharge: 2017-09-10 | Disposition: A | Discharge status: Home / Self Care | Payer: BLUE CROSS/BLUE SHIELD | Attending: Family Medicine | Admitting: Family Medicine

## 2017-09-10 DIAGNOSIS — M25561 Pain in right knee: Secondary | ICD-10-CM

## 2017-09-10 DIAGNOSIS — M79672 Pain in left foot: Secondary | ICD-10-CM

## 2017-09-10 MED ORDER — NAPROXEN 500 MG PO TABS: 500.0000 mg | ORAL_TABLET | Freq: Two times a day (BID) | ORAL | 0 refills | Status: AC

## 2017-09-10 NOTE — Discharge Instructions (Signed)
Start naproxen as directed. Ice/heat compress with elevation. Follow instructions on attachment for heel pain. Knee sleeve during activity. This can take up to 3-4 weeks to completely resolve, but should be feeling better each week. If symptoms not improving follow up with PCP/orthopedics for further evaluation needed.

## 2017-09-10 NOTE — ED Provider Notes (Signed)
[facility]    CSN: [id number] Arrival date & time: 09/10/17  1006     History   Chief Complaint Chief Complaint  Patient presents with  . Foot Pain  . Knee Pain    HPI Catherine Floyd is a 66 y.o. female.   66 year old female with history of HTN, CVA comes in with multiple problems.  Right knee pain after falling last week. States right knee gave out, which is her weaker knee since CVA in 2016. Felt that she may have twisted knee as she fell. Was able to bear weight after falling, but has since been hurting. States usually aching pain, but worsens at night after work, which is walking/standing for many hours. No swelling noticed. Has used muscle rubs, heating pad and tylenol 650mg  x 2 with relief, but pain returns. Denies numbness/tingling.  Left heel pain for the past month. Pain is the worse in the morning/after rest, which then improves after some movement. After work, pain continues and causes throbbing. States has memory foam support in her shoes during work. Denies numbness/tingling.        Past Medical History:  Diagnosis Date  . Cigarette nicotine dependence   . Hypertension   . Stroke [facility](HCC)     Patient Active Problem List   Diagnosis Date Noted  . Cerebrovascular accident (CVA) due to thrombosis of left middle cerebral artery (HCC) 09/07/2015  . Tobacco use disorder 09/07/2015  . 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 Stroke (HCC) 06/19/2015  . Essential hypertension 06/19/2015  . Hypokalemia 06/19/2015  . Tobacco abuse 06/19/2015    Past Surgical History:  Procedure Laterality Date  . ABDOMINAL HYSTERECTOMY    . APPENDECTOMY    . TUBAL LIGATION      OB History    No data available       Home Medications    Prior to Admission medications   Medication Sig Start Date End Date Taking? Authorizing Provider  amLODipine (NORVASC) 5 MG tablet Take 2 tablets (10 mg total) by mouth daily. 09/07/15   Yes Marvel PlanXu, [name], MD  aspirin 325 MG tablet Take 1 tablet (325 mg total) by mouth daily. 06/22/15  Yes Joseph ArtVann, Jessica U, DO  atorvastatin (LIPITOR) 80 MG tablet  12/07/15  Yes [provider]  doxazosin (CARDURA) 4 MG tablet Take 8 mg by mouth daily.  05/18/15  Yes [provider]  DULoxetine (CYMBALTA) 30 MG capsule Take 30 mg daily by mouth.   Yes [provider]  hydrochlorothiazide (HYDRODIURIL) 12.5 MG tablet Take 12.5 mg by mouth daily. 05/18/15  Yes [provider]  Multiple Vitamin (MULTIVITAMIN) tablet Take 1 tablet by mouth daily.   Yes [provider]  omeprazole (PRILOSEC) 20 MG capsule Take 20 mg daily by mouth.   Yes [provider]  escitalopram (LEXAPRO) 10 MG tablet  12/07/15   [provider]  naproxen (NAPROSYN) 500 MG tablet Take 1 tablet (500 mg total) 2 (two) times daily for 10 days by mouth. 09/10/17 09/20/17  Belinda FisherYu, Angas Isabell V, PA-C    Family History Family History  Problem Relation Age of Onset  . Stroke Father   . High blood pressure Father   . Bladder Cancer Mother   . Diabetes Son     Social History Social History   Tobacco Use  . Smoking status: Current Some Day Smoker    Packs/day: 1.00    Types: Cigarettes  .  Smokeless tobacco: Current User  Substance Use Topics  . Alcohol use: No    Alcohol/week: 0.0 oz  . Drug use: No     Allergies   Penicillins and Sulfa antibiotics   Review of Systems Review of Systems  Reason unable to perform ROS: See HPI as above.     Physical Exam Triage Vital Signs ED Triage Vitals  Enc Vitals Group     BP 09/10/17 1028 (!) 144/71     Pulse Rate 09/10/17 1028 69     Resp 09/10/17 1028 16     Temp 09/10/17 1028 98.4 F (36.9 C)     Temp Source 09/10/17 1028 Oral     SpO2 09/10/17 1028 99 %     Weight 09/10/17 1025 145 lb (65.8 kg)     Height 09/10/17 1025 5\' 2"  (1.575 m)     Head Circumference --      Peak Flow --      Pain Score 09/10/17 1026 7     Pain  Loc --      Pain Edu? --      Excl. in GC? --    No data found.  Updated Vital Signs BP (!) 144/71   Pulse 69   Temp 98.4 F (36.9 C) (Oral)   Resp 16   Ht 5\' 2"  (1.575 m)   Wt 145 lb (65.8 kg)   SpO2 99%   BMI 26.52 kg/m   Physical Exam  Constitutional: She is oriented to person, place, and time. She appears well-developed and well-nourished. No distress.  HENT:  Head: Normocephalic and atraumatic.  Eyes: Conjunctivae are normal. Pupils are equal, round, and reactive to light.  Musculoskeletal:  No obvious swelling noted of the left knee. Tenderness on palpation of the left medial joint line. Full ROM. Strength normal and equal bilaterally. Sensation intact. No obvious laxity with anterior/posterior drawer, valgus/varus stress.   No obvious swelling noted of the left heel. No tenderness on palpation of the heel, ankle, foot. Full ROM. Strength normal and equal bilaterally. Sensation intact.  Pedal pulses 2+ and equal bilaterally.   Neurological: She is alert and oriented to person, place, and time.     UC Treatments / Results  Labs (all labs ordered are listed, but only abnormal results are displayed) Labs Reviewed - No data to display  EKG  EKG Interpretation None       Radiology No results found.  Procedures Procedures (including critical care time)  Medications Ordered in UC Medications - No data to display   Initial Impression / Assessment and Plan / UC Course  I have reviewed the triage vital signs and the nursing notes.  Pertinent labs & imaging results that were available during my care of the patient were reviewed by me and considered in my medical decision making (see chart for details).    Start naproxen as directed. Ice/heat compress with elevation. Knee sleeve during activity. Supportive shoes during work. Follow up with PCP/orthopedics if symptoms not improving/does not resolve.  Discussed case with Dr Tracie HarrierHagler, who agrees to plan.   Final  Clinical Impressions(s) / UC Diagnoses   Final diagnoses:  Acute pain of right knee  Pain of left heel    ED Discharge Orders        Ordered    naproxen (NAPROSYN) 500 MG tablet  2 times daily     09/10/17 1053       Belinda FisherYu, Paulanthony Gleaves V, [place]PA-C 09/10/17 1105

## 2017-09-10 NOTE — ED Triage Notes (Signed)
Pt reports worsening left heel pain for 1 month.  PT reports right knee pain after a fall last week.

## 2017-12-02 ENCOUNTER — Encounter: Payer: Self-pay | Admitting: *Deleted

## 2017-12-02 DIAGNOSIS — Z006 Encounter for examination for normal comparison and control in clinical research program: Secondary | ICD-10-CM

## 2017-12-02 NOTE — Progress Notes (Signed)
STROKE-AF Research study month 30 follow up visit completed. Patient has not had any adverse event. She states she has not had any changes in her medication. Next research required phone visit is due between 25/jul/19-19/sep/2019. I thanked her participating and would talk with her in the summer.

## 2017-12-22 ENCOUNTER — Other Ambulatory Visit: Payer: Self-pay | Admitting: Physician Assistant

## 2017-12-22 ENCOUNTER — Ambulatory Visit
Admission: RE | Admit: 2017-12-22 | Discharge: 2017-12-22 | Disposition: A | Discharge status: Home / Self Care | Payer: BLUE CROSS/BLUE SHIELD | Source: Ambulatory Visit | Attending: Physician Assistant | Admitting: Physician Assistant

## 2017-12-22 DIAGNOSIS — M25562 Pain in left knee: Principal | ICD-10-CM

## 2017-12-22 DIAGNOSIS — M25561 Pain in right knee: Secondary | ICD-10-CM

## 2018-05-29 ENCOUNTER — Telehealth: Payer: Self-pay | Admitting: *Deleted

## 2018-05-29 NOTE — Telephone Encounter (Signed)
Left message for patient to call research office for 36 month telephone follow up call [facility].

## 2018-05-29 NOTE — Telephone Encounter (Signed)
[facility]

## 2018-06-09 ENCOUNTER — Encounter: Payer: Self-pay | Admitting: *Deleted

## 2018-06-09 DIAGNOSIS — Z006 Encounter for examination for normal comparison and control in clinical research program: Secondary | ICD-10-CM

## 2018-06-09 NOTE — Progress Notes (Signed)
STROKE~AF research study final telephone follow up completed. Patient states she has been fine and had not had any medication changes. I thanked her for her participation in the study.

## 2018-10-17 ENCOUNTER — Emergency Department (HOSPITAL_COMMUNITY): Payer: BLUE CROSS/BLUE SHIELD

## 2018-10-17 ENCOUNTER — Emergency Department (HOSPITAL_COMMUNITY)
Admission: EM | Admit: 2018-10-17 | Discharge: 2018-10-17 | Disposition: A | Discharge status: Home / Self Care | Payer: BLUE CROSS/BLUE SHIELD | Attending: Emergency Medicine | Admitting: Emergency Medicine

## 2018-10-17 DIAGNOSIS — Z7982 Long term (current) use of aspirin: Secondary | ICD-10-CM | POA: Diagnosis not present

## 2018-10-17 DIAGNOSIS — Z8673 Personal history of transient ischemic attack (TIA), and cerebral infarction without residual deficits: Secondary | ICD-10-CM | POA: Diagnosis not present

## 2018-10-17 DIAGNOSIS — F17210 Nicotine dependence, cigarettes, uncomplicated: Secondary | ICD-10-CM | POA: Diagnosis not present

## 2018-10-17 DIAGNOSIS — M25462 Effusion, left knee: Secondary | ICD-10-CM | POA: Diagnosis not present

## 2018-10-17 DIAGNOSIS — M25562 Pain in left knee: Secondary | ICD-10-CM | POA: Insufficient documentation

## 2018-10-17 DIAGNOSIS — Z79899 Other long term (current) drug therapy: Secondary | ICD-10-CM | POA: Insufficient documentation

## 2018-10-17 DIAGNOSIS — I10 Essential (primary) hypertension: Secondary | ICD-10-CM | POA: Diagnosis not present

## 2018-10-17 MED ORDER — DICLOFENAC SODIUM 1 % TD GEL
2.0000 g | Freq: Once | TRANSDERMAL | Status: AC
  Administered 2018-10-17: 08:00:00 2 g via TOPICAL
  Filled 2018-10-17: qty 100

## 2018-10-17 NOTE — Discharge Instructions (Addendum)
Your x-ray shows a small amount of fluid on the knee which is likely related to inflammation from injury, no evidence of fracture.  Use Voltaren gel in addition to Tylenol and knee sleeve for support.  Follow-up with orthopedics if pain is not improving.  Return to the emergency department if the knee becomes hot, red swollen, you are unable to bend or straighten it, you develop fevers or any other new or concerning symptoms.

## 2018-10-17 NOTE — ED Triage Notes (Signed)
Pt reports L leg pain after having her dogs "run into it" 4 days ago. No deformity/brusing/swelling noted. Ambulatory.

## 2018-10-17 NOTE — ED Provider Notes (Signed)
[facility] EMERGENCY DEPARTMENT Provider Note   CSN: [id number] Arrival date & time: 10/17/18  4098     History   Chief Complaint Chief Complaint  Patient presents with  . Leg Pain    HPI TELESA Floyd is a 67 y.o. female.  Catherine Floyd is a 67 y.o. female with a history of hypertension, stroke and tobacco use, who presents to the emergency department for evaluation of left leg and knee pain.  Patient reports pain started 4 days ago after her dog was running outside playing and ran into the side of her left knee.  She has been able to walk but reports that it feels like her knee will give out and she has had continued pain with small amount of swelling.  She has not noted any bruising or obvious deformity and has been up walking around on the knee with no further injury and no falls.  Able to bend and extend the knee with some discomfort.  She denies any numbness or tingling in the leg, no pain in the calf or thigh.  No prior injury or surgery to the knee.  She reports she has been taking Tylenol and using ice but is continued to have pain.  No other aggravating or alleviating factors.     Past Medical History:  Diagnosis Date  . Cigarette nicotine dependence   . Hypertension   . Stroke [facility])     Patient Active Problem List   Diagnosis Date Noted  . Cerebrovascular accident (CVA) due to thrombosis of left middle cerebral artery (HCC) 09/07/2015  . Tobacco use disorder 09/07/2015  . 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 Stroke (HCC) 06/19/2015  . Essential hypertension 06/19/2015  . Hypokalemia 06/19/2015  . Tobacco abuse 06/19/2015    Past Surgical History:  Procedure Laterality Date  . ABDOMINAL HYSTERECTOMY    . APPENDECTOMY    . TUBAL LIGATION       OB History   No obstetric history on file.      Home Medications    Prior to Admission medications   Medication Sig Start Date End Date  Taking? Authorizing Provider  amLODipine (NORVASC) 5 MG tablet Take 2 tablets (10 mg total) by mouth daily. 09/07/15   Marvel Plan, MD  aspirin 325 MG tablet Take 1 tablet (325 mg total) by mouth daily. 06/22/15   Joseph Art, DO  atorvastatin (LIPITOR) 80 MG tablet  12/07/15   [provider]  doxazosin (CARDURA) 4 MG tablet Take 8 mg by mouth daily.  05/18/15   [provider]  DULoxetine (CYMBALTA) 30 MG capsule Take 30 mg daily by mouth.    [provider]  escitalopram (LEXAPRO) 10 MG tablet  12/07/15   [provider]  hydrochlorothiazide (HYDRODIURIL) 12.5 MG tablet Take 12.5 mg by mouth daily. 05/18/15   [provider]  Multiple Vitamin (MULTIVITAMIN) tablet Take 1 tablet by mouth daily.    [provider]  omeprazole (PRILOSEC) 20 MG capsule Take 20 mg daily by mouth.    [provider]    Family History Family History  Problem Relation Age of Onset  . Stroke Father   . High blood pressure Father   . Bladder Cancer Mother   . Diabetes Son     Social History Social History   Tobacco Use  . Smoking status: Current Some Day Smoker    Packs/day: 1.00  Types: Cigarettes  . Smokeless tobacco: Current User  Substance Use Topics  . Alcohol use: No    Alcohol/week: 0.0 standard drinks  . Drug use: No     Allergies   Penicillins and Sulfa antibiotics   Review of Systems Review of Systems  Constitutional: Negative for chills and fever.  Musculoskeletal: Positive for arthralgias. Negative for joint swelling.  Skin: Negative for color change and rash.  Neurological: Negative for weakness and numbness.     Physical Exam Updated Vital Signs BP 129/73 (BP Location: Left Arm)   Pulse 73   Temp 98.1 F (36.7 C) (Oral)   Resp 18   SpO2 99%   Physical Exam Vitals signs and nursing note reviewed.  Constitutional:      General: She is not in acute distress.    Appearance: Normal appearance. She is  well-developed and normal weight. She is not diaphoretic.  HENT:     Head: Normocephalic and atraumatic.  Eyes:     General:        Right eye: No discharge.        Left eye: No discharge.  Pulmonary:     Effort: Pulmonary effort is normal. No respiratory distress.  Musculoskeletal:     Comments: Tenderness to palpation over the lateral aspect of the left knee without palpable deformity, no obvious effusion or swelling, no overlying erythema or skin changes.  Patient able to range knee greater than 90 degrees with minimal discomfort.  No pain to palpation over the thigh or calf, 2+ DP and TP pulses, good capillary refill, normal sensation and 5/5 strength.  Skin:    General: Skin is warm and dry.     Capillary Refill: Capillary refill takes less than 2 seconds.  Neurological:     General: No focal deficit present.     Mental Status: She is alert and oriented to person, place, and time.     Coordination: Coordination normal.  Psychiatric:        Mood and Affect: Mood normal.        Behavior: Behavior normal.      ED Treatments / Results  Labs (all labs ordered are listed, but only abnormal results are displayed) Labs Reviewed - No data to display  EKG None  Radiology Dg Knee Complete 4 Views Left  Result Date: 10/17/2018 CLINICAL DATA:  LEFT leg pain after having a dog run into it 4 days ago. EXAM: LEFT KNEE - COMPLETE 4+ VIEW COMPARISON:  12/22/2017. FINDINGS: No evidence of fracture, or dislocation. Small suprapatellar joint effusion. Nonspecific sclerotic focus medial proximal LEFT tibia appears non worrisome and is stable from priors. Vascular calcification. IMPRESSION: Small joint effusion. No fracture or dislocation. Electronically Signed   By: Elsie StainJohn T Curnes M.D.   On: 10/17/2018 07:26    Procedures Procedures (including critical care time)  Medications Ordered in ED Medications  diclofenac sodium (VOLTAREN) 1 % transdermal gel 2 g (has no administration in time  range)     Initial Impression / Assessment and Plan / ED Course  I have reviewed the triage vital signs and the nursing notes.  Pertinent labs & imaging results that were available during my care of the patient were reviewed by me and considered in my medical decision making (see chart for details).  Patient presents for evaluation of left knee pain which started after her dog ran into her knee 4 days ago.  She has not had any obvious deformity or swelling to the knee but  has continued to have pain.  On exam there is tenderness over the lateral aspect without obvious deformity, the left extremity is neurovascularly intact.  X-ray shows a small effusion but no evidence of fracture dislocation.  Will treat with Voltaren gel in addition to patient's home Tylenol as well as a knee sleeve.  Encourage Ortho follow-up if pain is not improving over the next week.  Return precautions been discussed.  No concern for septic arthritis at this time.  Patient expresses understanding and is stable for discharge home in good condition.  Final Clinical Impressions(s) / ED Diagnoses   Final diagnoses:  Acute pain of left knee    ED Discharge Orders    None       Legrand Rams 10/17/18 1610    Wynetta Fines, MD 10/18/18 0700

## 2018-11-11 DIAGNOSIS — M25562 Pain in left knee: Secondary | ICD-10-CM | POA: Diagnosis not present

## 2018-11-11 DIAGNOSIS — M25462 Effusion, left knee: Secondary | ICD-10-CM | POA: Diagnosis not present

## 2019-01-07 DIAGNOSIS — E7800 Pure hypercholesterolemia, unspecified: Secondary | ICD-10-CM | POA: Diagnosis not present

## 2019-01-07 DIAGNOSIS — I10 Essential (primary) hypertension: Secondary | ICD-10-CM | POA: Diagnosis not present

## 2019-01-13 DIAGNOSIS — F3342 Major depressive disorder, recurrent, in full remission: Secondary | ICD-10-CM | POA: Diagnosis not present

## 2019-01-13 DIAGNOSIS — E7800 Pure hypercholesterolemia, unspecified: Secondary | ICD-10-CM | POA: Diagnosis not present

## 2019-01-13 DIAGNOSIS — E876 Hypokalemia: Secondary | ICD-10-CM | POA: Diagnosis not present

## 2019-01-13 DIAGNOSIS — I10 Essential (primary) hypertension: Secondary | ICD-10-CM | POA: Diagnosis not present

## 2019-02-22 DIAGNOSIS — M25552 Pain in left hip: Secondary | ICD-10-CM | POA: Diagnosis not present

## 2019-02-22 DIAGNOSIS — M25562 Pain in left knee: Secondary | ICD-10-CM | POA: Diagnosis not present

## 2019-04-13 DIAGNOSIS — E876 Hypokalemia: Secondary | ICD-10-CM | POA: Diagnosis not present

## 2019-06-13 IMAGING — CR DG KNEE AP/LAT W/ SUNRISE*L*
3 series · 3 of 3 positions shown · non-contrast
Comparison: None.

CLINICAL DATA: 66-year-old female with onset of bilateral knee pain
for the past 2 months greater on the right. No trauma. Initial
encounter.

EXAM:
LEFT KNEE 3 VIEWS

[w knee ap left]
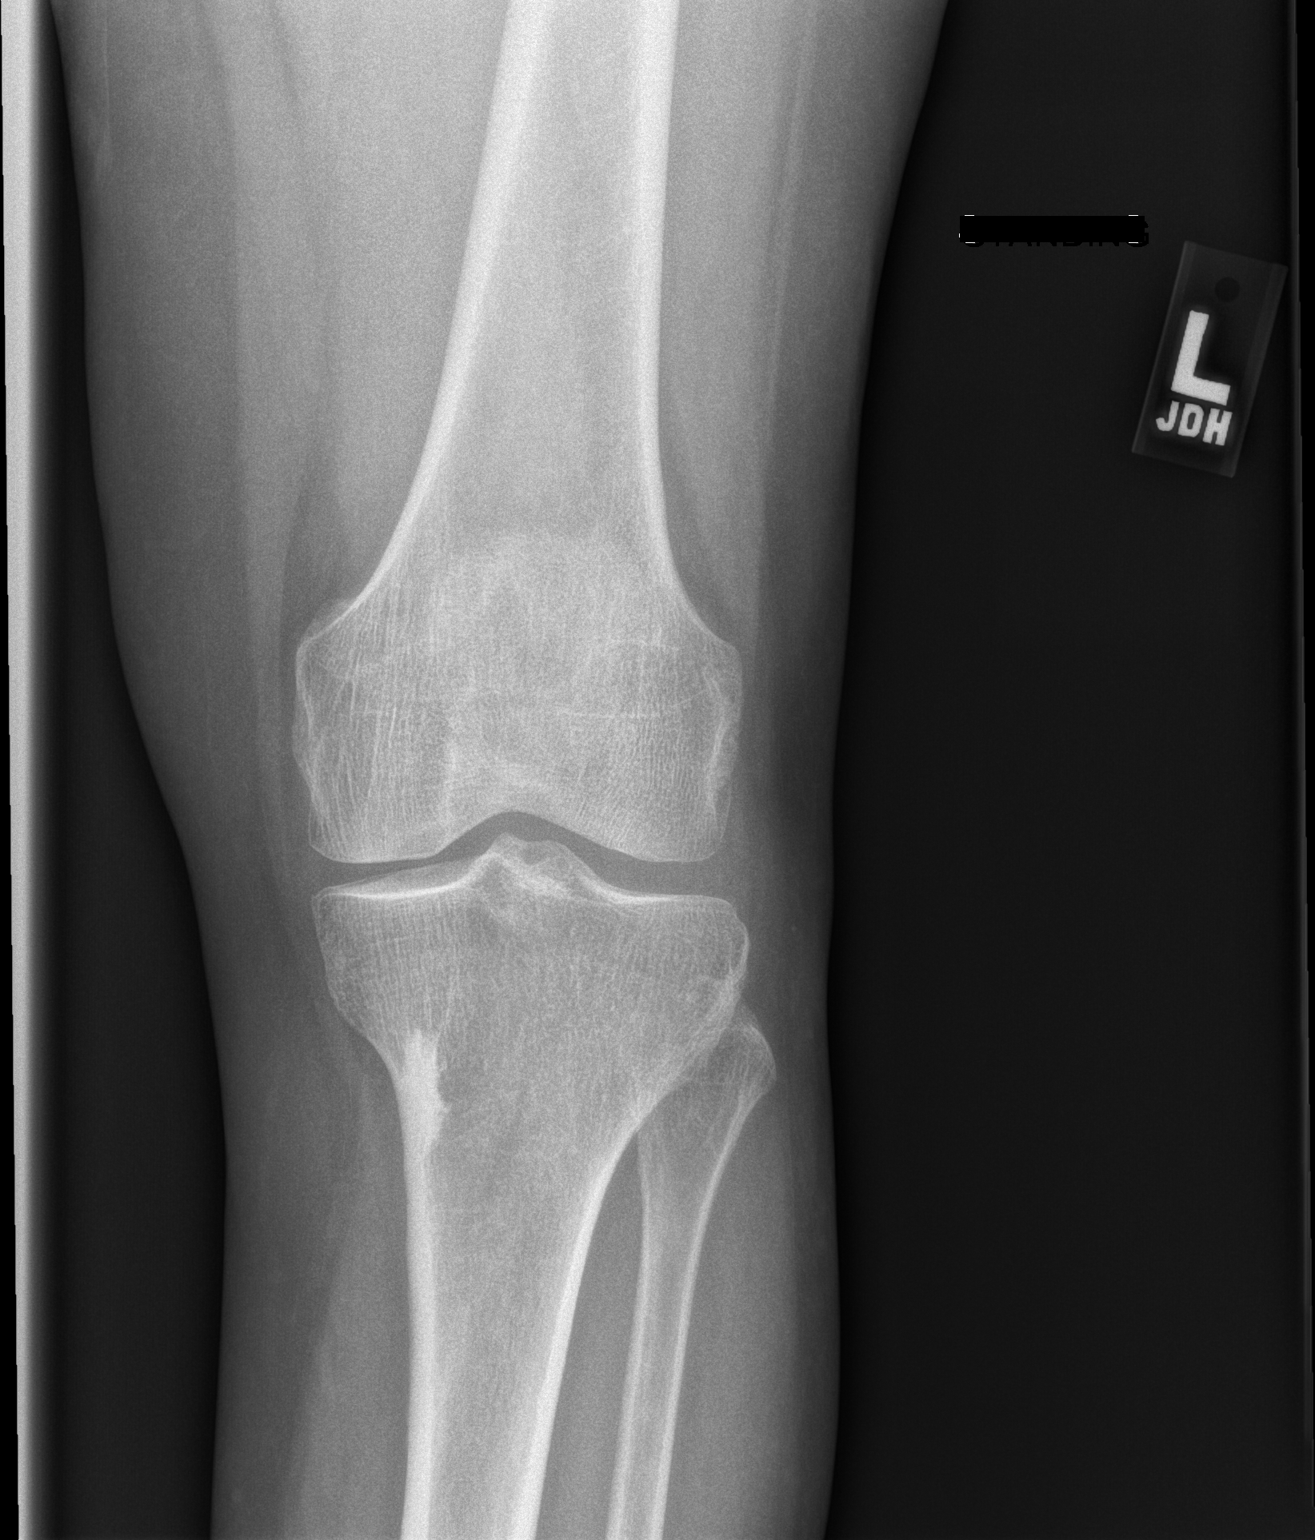

[w knee lat left]
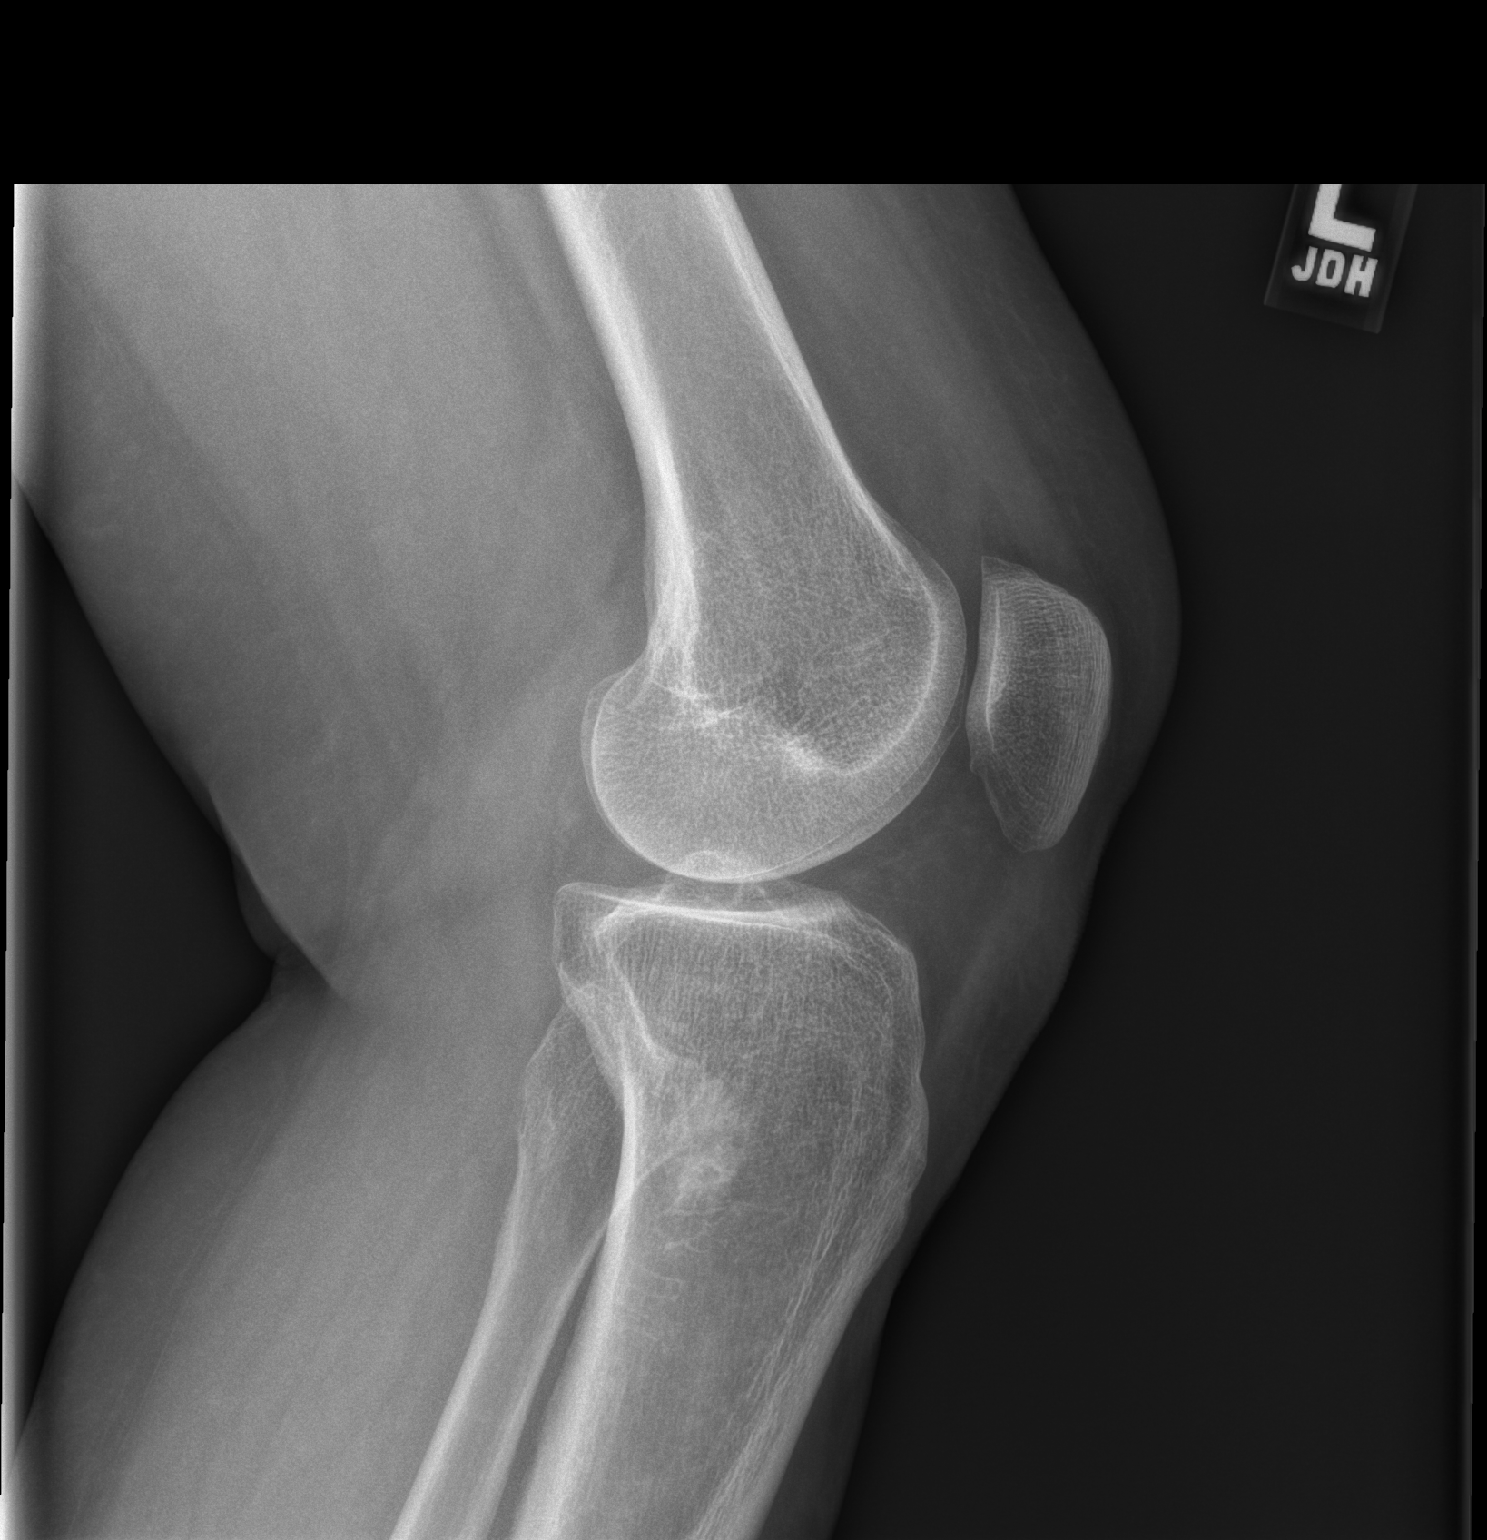

[x knee sunrise left]
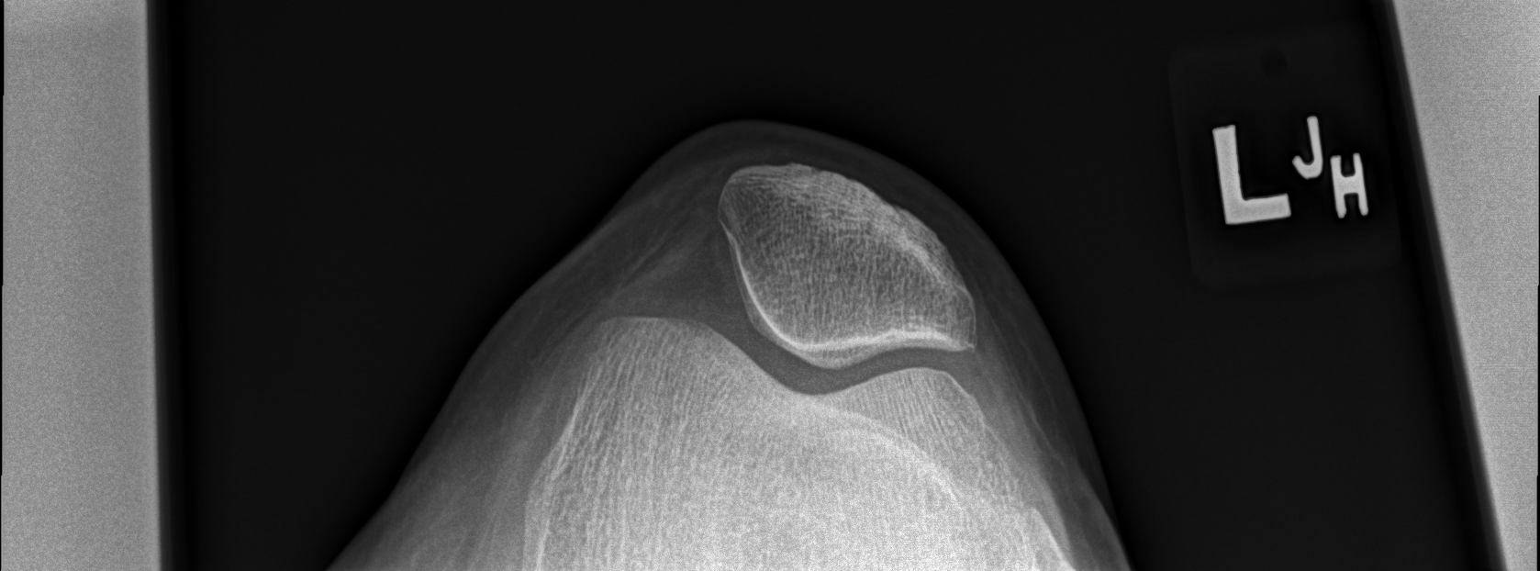

[3 of 3 positions shown; findings below may reference images not displayed]

FINDINGS: Nonspecific sclerotic focus spanning over 2.5 cm medial aspect of
the proximal tibia.

No significant joint space narrowing.

There may be a tiny amount of suprapatellar joint fluid.

No fracture or dislocation.
IMPRESSION: No significant joint space narrowing.

Nonspecific sclerotic focus medial aspect of the proximal left
tibia.

## 2019-06-23 DIAGNOSIS — M1711 Unilateral primary osteoarthritis, right knee: Secondary | ICD-10-CM | POA: Diagnosis not present

## 2019-06-23 DIAGNOSIS — M25561 Pain in right knee: Secondary | ICD-10-CM | POA: Diagnosis not present

## 2019-07-13 DIAGNOSIS — E7800 Pure hypercholesterolemia, unspecified: Secondary | ICD-10-CM | POA: Diagnosis not present

## 2019-07-13 DIAGNOSIS — E871 Hypo-osmolality and hyponatremia: Secondary | ICD-10-CM | POA: Diagnosis not present

## 2019-07-14 DIAGNOSIS — I10 Essential (primary) hypertension: Secondary | ICD-10-CM | POA: Diagnosis not present

## 2019-07-14 DIAGNOSIS — F3342 Major depressive disorder, recurrent, in full remission: Secondary | ICD-10-CM | POA: Diagnosis not present

## 2019-07-14 DIAGNOSIS — E7800 Pure hypercholesterolemia, unspecified: Secondary | ICD-10-CM | POA: Diagnosis not present

## 2019-07-14 DIAGNOSIS — Z0000 Encounter for general adult medical examination without abnormal findings: Secondary | ICD-10-CM | POA: Diagnosis not present

## 2019-07-14 DIAGNOSIS — F17200 Nicotine dependence, unspecified, uncomplicated: Secondary | ICD-10-CM | POA: Diagnosis not present

## 2019-07-14 DIAGNOSIS — E876 Hypokalemia: Secondary | ICD-10-CM | POA: Diagnosis not present

## 2019-08-18 DIAGNOSIS — M25561 Pain in right knee: Secondary | ICD-10-CM | POA: Diagnosis not present

## 2019-08-18 DIAGNOSIS — G8929 Other chronic pain: Secondary | ICD-10-CM | POA: Diagnosis not present

## 2019-08-18 DIAGNOSIS — F17210 Nicotine dependence, cigarettes, uncomplicated: Secondary | ICD-10-CM | POA: Diagnosis not present

## 2019-08-18 DIAGNOSIS — M5432 Sciatica, left side: Secondary | ICD-10-CM | POA: Diagnosis not present

## 2019-08-18 DIAGNOSIS — M25562 Pain in left knee: Secondary | ICD-10-CM | POA: Diagnosis not present

## 2019-12-01 DIAGNOSIS — Z113 Encounter for screening for infections with a predominantly sexual mode of transmission: Secondary | ICD-10-CM | POA: Diagnosis not present

## 2019-12-01 DIAGNOSIS — N760 Acute vaginitis: Secondary | ICD-10-CM | POA: Diagnosis not present

## 2019-12-01 DIAGNOSIS — R350 Frequency of micturition: Secondary | ICD-10-CM | POA: Diagnosis not present

## 2019-12-01 DIAGNOSIS — B9689 Other specified bacterial agents as the cause of diseases classified elsewhere: Secondary | ICD-10-CM | POA: Diagnosis not present

## 2019-12-01 DIAGNOSIS — N3001 Acute cystitis with hematuria: Secondary | ICD-10-CM | POA: Diagnosis not present

## 2020-01-10 DIAGNOSIS — E876 Hypokalemia: Secondary | ICD-10-CM | POA: Diagnosis not present

## 2020-01-10 DIAGNOSIS — F17210 Nicotine dependence, cigarettes, uncomplicated: Secondary | ICD-10-CM | POA: Diagnosis not present

## 2020-01-10 DIAGNOSIS — E7800 Pure hypercholesterolemia, unspecified: Secondary | ICD-10-CM | POA: Diagnosis not present

## 2020-01-10 DIAGNOSIS — R062 Wheezing: Secondary | ICD-10-CM | POA: Diagnosis not present

## 2020-01-10 DIAGNOSIS — I10 Essential (primary) hypertension: Secondary | ICD-10-CM | POA: Diagnosis not present

## 2020-01-10 DIAGNOSIS — F3342 Major depressive disorder, recurrent, in full remission: Secondary | ICD-10-CM | POA: Diagnosis not present

## 2020-01-12 DIAGNOSIS — E7800 Pure hypercholesterolemia, unspecified: Secondary | ICD-10-CM | POA: Diagnosis not present

## 2020-01-12 DIAGNOSIS — I10 Essential (primary) hypertension: Secondary | ICD-10-CM | POA: Diagnosis not present

## 2020-01-12 DIAGNOSIS — E876 Hypokalemia: Secondary | ICD-10-CM | POA: Diagnosis not present

## 2020-01-27 ENCOUNTER — Ambulatory Visit: Payer: Self-pay | Attending: Internal Medicine

## 2020-01-27 DIAGNOSIS — Z23 Encounter for immunization: Secondary | ICD-10-CM

## 2020-02-23 ENCOUNTER — Ambulatory Visit: Payer: Self-pay | Attending: Internal Medicine

## 2020-02-23 DIAGNOSIS — Z23 Encounter for immunization: Secondary | ICD-10-CM

## 2020-03-01 DIAGNOSIS — H5789 Other specified disorders of eye and adnexa: Secondary | ICD-10-CM | POA: Diagnosis not present

## 2020-07-12 DIAGNOSIS — I10 Essential (primary) hypertension: Secondary | ICD-10-CM | POA: Diagnosis not present

## 2020-07-12 DIAGNOSIS — Z23 Encounter for immunization: Secondary | ICD-10-CM | POA: Diagnosis not present

## 2020-07-12 DIAGNOSIS — N3001 Acute cystitis with hematuria: Secondary | ICD-10-CM | POA: Diagnosis not present

## 2020-07-12 DIAGNOSIS — R1032 Left lower quadrant pain: Secondary | ICD-10-CM | POA: Diagnosis not present

## 2020-07-12 DIAGNOSIS — Z0000 Encounter for general adult medical examination without abnormal findings: Secondary | ICD-10-CM | POA: Diagnosis not present

## 2020-07-12 DIAGNOSIS — R350 Frequency of micturition: Secondary | ICD-10-CM | POA: Diagnosis not present

## 2020-07-12 DIAGNOSIS — E7800 Pure hypercholesterolemia, unspecified: Secondary | ICD-10-CM | POA: Diagnosis not present

## 2020-07-12 DIAGNOSIS — B009 Herpesviral infection, unspecified: Secondary | ICD-10-CM | POA: Diagnosis not present

## 2020-07-12 DIAGNOSIS — F330 Major depressive disorder, recurrent, mild: Secondary | ICD-10-CM | POA: Diagnosis not present

## 2020-07-12 DIAGNOSIS — F17200 Nicotine dependence, unspecified, uncomplicated: Secondary | ICD-10-CM | POA: Diagnosis not present

## 2020-07-18 ENCOUNTER — Other Ambulatory Visit: Payer: Self-pay

## 2020-07-18 ENCOUNTER — Emergency Department (HOSPITAL_COMMUNITY): Payer: Medicare Other

## 2020-07-18 ENCOUNTER — Encounter (HOSPITAL_COMMUNITY): Payer: Self-pay | Admitting: *Deleted

## 2020-07-18 ENCOUNTER — Observation Stay (HOSPITAL_COMMUNITY)
Admission: EM | Admit: 2020-07-18 | Discharge: 2020-07-19 | Disposition: A | Discharge status: Home / Self Care | Payer: Medicare Other | Attending: Internal Medicine | Admitting: Internal Medicine

## 2020-07-18 DIAGNOSIS — R0602 Shortness of breath: Secondary | ICD-10-CM | POA: Diagnosis present

## 2020-07-18 DIAGNOSIS — J209 Acute bronchitis, unspecified: Principal | ICD-10-CM | POA: Insufficient documentation

## 2020-07-18 DIAGNOSIS — E876 Hypokalemia: Secondary | ICD-10-CM | POA: Diagnosis not present

## 2020-07-18 DIAGNOSIS — J9600 Acute respiratory failure, unspecified whether with hypoxia or hypercapnia: Secondary | ICD-10-CM | POA: Insufficient documentation

## 2020-07-18 DIAGNOSIS — R509 Fever, unspecified: Secondary | ICD-10-CM | POA: Diagnosis not present

## 2020-07-18 DIAGNOSIS — I10 Essential (primary) hypertension: Secondary | ICD-10-CM | POA: Diagnosis not present

## 2020-07-18 DIAGNOSIS — Z20822 Contact with and (suspected) exposure to covid-19: Secondary | ICD-10-CM | POA: Insufficient documentation

## 2020-07-18 DIAGNOSIS — J9601 Acute respiratory failure with hypoxia: Secondary | ICD-10-CM | POA: Diagnosis not present

## 2020-07-18 DIAGNOSIS — Z79899 Other long term (current) drug therapy: Secondary | ICD-10-CM | POA: Diagnosis not present

## 2020-07-18 DIAGNOSIS — Z720 Tobacco use: Secondary | ICD-10-CM | POA: Diagnosis present

## 2020-07-18 DIAGNOSIS — Z8673 Personal history of transient ischemic attack (TIA), and cerebral infarction without residual deficits: Secondary | ICD-10-CM

## 2020-07-18 DIAGNOSIS — F17210 Nicotine dependence, cigarettes, uncomplicated: Secondary | ICD-10-CM | POA: Diagnosis not present

## 2020-07-18 DIAGNOSIS — J40 Bronchitis, not specified as acute or chronic: Secondary | ICD-10-CM | POA: Diagnosis not present

## 2020-07-18 DIAGNOSIS — F17200 Nicotine dependence, unspecified, uncomplicated: Secondary | ICD-10-CM | POA: Diagnosis not present

## 2020-07-18 DIAGNOSIS — R0902 Hypoxemia: Secondary | ICD-10-CM

## 2020-07-18 LAB — CBC
HCT: 41.1 % (ref 36.0–46.0)
Hemoglobin: 13.3 g/dL (ref 12.0–15.0)
MCH: 31.2 pg (ref 26.0–34.0)
MCHC: 32.4 g/dL (ref 30.0–36.0)
MCV: 96.5 fL (ref 80.0–100.0)
Platelets: 289 10*3/uL (ref 150–400)
RBC: 4.26 MIL/uL (ref 3.87–5.11)
RDW: 13.3 % (ref 11.5–15.5)
WBC: 11.3 10*3/uL — ABNORMAL HIGH (ref 4.0–10.5)
nRBC: 0 % (ref 0.0–0.2)

## 2020-07-18 LAB — URINALYSIS, ROUTINE W REFLEX MICROSCOPIC
Bacteria, UA: NONE SEEN
Bilirubin Urine: NEGATIVE
Glucose, UA: NEGATIVE mg/dL
Ketones, ur: NEGATIVE mg/dL
Leukocytes,Ua: NEGATIVE
Nitrite: NEGATIVE
Protein, ur: NEGATIVE mg/dL
Specific Gravity, Urine: 1.045 — ABNORMAL HIGH (ref 1.005–1.030)
pH: 6 (ref 5.0–8.0)

## 2020-07-18 LAB — BRAIN NATRIURETIC PEPTIDE: B Natriuretic Peptide: 23.8 pg/mL (ref 0.0–100.0)

## 2020-07-18 LAB — RESPIRATORY PANEL BY PCR

## 2020-07-18 LAB — BASIC METABOLIC PANEL
Anion gap: 11 (ref 5–15)
BUN: 9 mg/dL (ref 8–23)
CO2: 28 mmol/L (ref 22–32)
Calcium: 9.5 mg/dL (ref 8.9–10.3)
Chloride: 98 mmol/L (ref 98–111)
Creatinine, Ser: 0.68 mg/dL (ref 0.44–1.00)
GFR calc Af Amer: >60 mL/min (ref >60)
GFR calc non Af Amer: >60 mL/min (ref >60)
Glucose, Bld: 112 mg/dL — ABNORMAL HIGH (ref 70–99)
Potassium: 3.4 mmol/L — ABNORMAL LOW (ref 3.5–5.1)
Sodium: 137 mmol/L (ref 135–145)

## 2020-07-18 LAB — SARS CORONAVIRUS 2 BY RT PCR (HOSPITAL ORDER, PERFORMED IN ~~LOC~~ HOSPITAL LAB): SARS Coronavirus 2: NEGATIVE

## 2020-07-18 LAB — TROPONIN I (HIGH SENSITIVITY)
Troponin I (High Sensitivity): 10 ng/L (ref <18)
Troponin I (High Sensitivity): 16 ng/L (ref <18)

## 2020-07-18 MED ORDER — DOXAZOSIN MESYLATE 4 MG PO TABS
4.0000 mg | ORAL_TABLET | Freq: Every day | ORAL | Status: DC
  Administered 2020-07-18 – 2020-07-19 (×2): 4 mg via ORAL
  Filled 2020-07-18 (×2): qty 1

## 2020-07-18 MED ORDER — POTASSIUM CHLORIDE CRYS ER 20 MEQ PO TBCR
20.0000 meq | EXTENDED_RELEASE_TABLET | Freq: Every day | ORAL | Status: DC
  Administered 2020-07-19: 20 meq via ORAL
  Filled 2020-07-18 (×2): qty 1

## 2020-07-18 MED ORDER — POTASSIUM CHLORIDE CRYS ER 20 MEQ PO TBCR
20.0000 meq | EXTENDED_RELEASE_TABLET | ORAL | Status: AC
  Administered 2020-07-18: 20 meq via ORAL
  Filled 2020-07-18: qty 1

## 2020-07-18 MED ORDER — PANTOPRAZOLE SODIUM 40 MG PO TBEC
40.0000 mg | DELAYED_RELEASE_TABLET | Freq: Every day | ORAL | Status: DC
  Administered 2020-07-18 – 2020-07-19 (×2): 40 mg via ORAL
  Filled 2020-07-18 (×2): qty 1

## 2020-07-18 MED ORDER — SODIUM CHLORIDE 0.9 % IV SOLN
1.0000 g | INTRAVENOUS | Status: DC
  Administered 2020-07-18 – 2020-07-19 (×2): 1 g via INTRAVENOUS
  Filled 2020-07-18 (×2): qty 10

## 2020-07-18 MED ORDER — ESCITALOPRAM OXALATE 10 MG PO TABS
10.0000 mg | ORAL_TABLET | Freq: Every day | ORAL | Status: DC
  Administered 2020-07-18 – 2020-07-19 (×2): 10 mg via ORAL
  Filled 2020-07-18 (×2): qty 1

## 2020-07-18 MED ORDER — IPRATROPIUM-ALBUTEROL 0.5-2.5 (3) MG/3ML IN SOLN
3.0000 mL | Freq: Four times a day (QID) | RESPIRATORY_TRACT | Status: DC
  Administered 2020-07-18 – 2020-07-19 (×6): 3 mL via RESPIRATORY_TRACT
  Filled 2020-07-18 (×6): qty 3

## 2020-07-18 MED ORDER — METHYLPREDNISOLONE SODIUM SUCC 125 MG IJ SOLR
125.0000 mg | Freq: Once | INTRAMUSCULAR | Status: AC
  Administered 2020-07-18: 125 mg via INTRAVENOUS
  Filled 2020-07-18: qty 2

## 2020-07-18 MED ORDER — VALACYCLOVIR HCL 500 MG PO TABS
1000.0000 mg | ORAL_TABLET | Freq: Every day | ORAL | Status: DC
  Administered 2020-07-18 – 2020-07-19 (×2): 1000 mg via ORAL
  Filled 2020-07-18 (×3): qty 2

## 2020-07-18 MED ORDER — IOHEXOL 350 MG/ML SOLN
75.0000 mL | Freq: Once | INTRAVENOUS | Status: AC | PRN
  Administered 2020-07-18: 66 mL via INTRAVENOUS

## 2020-07-18 MED ORDER — ATORVASTATIN CALCIUM 80 MG PO TABS
80.0000 mg | ORAL_TABLET | Freq: Every day | ORAL | Status: DC
  Administered 2020-07-18 – 2020-07-19 (×2): 80 mg via ORAL
  Filled 2020-07-18: qty 1
  Filled 2020-07-18: qty 2

## 2020-07-18 MED ORDER — AMLODIPINE BESYLATE 5 MG PO TABS
5.0000 mg | ORAL_TABLET | Freq: Every day | ORAL | Status: DC
  Administered 2020-07-18 – 2020-07-19 (×2): 5 mg via ORAL
  Filled 2020-07-18 (×2): qty 1

## 2020-07-18 MED ORDER — ASPIRIN 325 MG PO TABS
325.0000 mg | ORAL_TABLET | Freq: Every day | ORAL | Status: DC
  Administered 2020-07-18 – 2020-07-19 (×2): 325 mg via ORAL
  Filled 2020-07-18 (×2): qty 1

## 2020-07-18 MED ORDER — HYDROCHLOROTHIAZIDE 25 MG PO TABS
12.5000 mg | ORAL_TABLET | Freq: Every day | ORAL | Status: DC
  Administered 2020-07-18 – 2020-07-19 (×2): 12.5 mg via ORAL
  Filled 2020-07-18 (×2): qty 1

## 2020-07-18 MED ORDER — DULOXETINE HCL 30 MG PO CPEP
30.0000 mg | ORAL_CAPSULE | Freq: Every day | ORAL | Status: DC
  Administered 2020-07-18 – 2020-07-19 (×2): 30 mg via ORAL
  Filled 2020-07-18 (×3): qty 1

## 2020-07-18 MED ORDER — PREDNISONE 20 MG PO TABS
40.0000 mg | ORAL_TABLET | Freq: Every day | ORAL | Status: DC
  Administered 2020-07-19: 40 mg via ORAL
  Filled 2020-07-18: qty 2

## 2020-07-18 MED ORDER — NICOTINE 21 MG/24HR TD PT24
21.0000 mg | MEDICATED_PATCH | Freq: Every day | TRANSDERMAL | Status: DC
  Administered 2020-07-18 – 2020-07-19 (×2): 21 mg via TRANSDERMAL
  Filled 2020-07-18 (×2): qty 1

## 2020-07-18 NOTE — ED Provider Notes (Signed)
[facility] EMERGENCY DEPARTMENT Provider Note   CSN: [id number] Arrival date & time: 07/18/20  0801     History Chief Complaint  Patient presents with  . Shortness of Breath    Catherine Floyd is a 69 y.o. female.  69 yo F with a chief complaint of fatigue and cough.  This been going on for about a week now.  She was seen at her family doctor's office and was given the pneumonia and shingles vaccination.  At that time she was having some dysuria and was started on Macrobid.  She feels like her urinary symptoms of gotten a little bit better.  She denies flank pain or fever.  She does not feel that she is especially short of breath.  Denies any chest pain or pressure.  For like her cough got a lot worse last night and so came into the ED for evaluation.  Not on oxygen at home.-  The history is provided by the patient.  Shortness of Breath Severity:  Moderate Onset quality:  Gradual Duration:  1 week Timing:  Constant Progression:  Worsening Chronicity:  New Relieved by:  Nothing Worsened by:  Coughing Ineffective treatments:  None tried Associated symptoms: cough   Associated symptoms: no chest pain, no fever, no headaches, no vomiting and no wheezing        Past Medical History:  Diagnosis Date  . Cigarette nicotine dependence   . Hypertension   . Stroke [facility])     Patient Active Problem List   Diagnosis Date Noted  . Cerebrovascular accident (CVA) due to thrombosis of left middle cerebral artery (HCC) 09/07/2015  . Tobacco use disorder 09/07/2015  . 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 Stroke (HCC) 06/19/2015  . Essential hypertension 06/19/2015  . Hypokalemia 06/19/2015  . Tobacco abuse 06/19/2015    Past Surgical History:  Procedure Laterality Date  . ABDOMINAL HYSTERECTOMY    . APPENDECTOMY    . TUBAL LIGATION       OB History   No obstetric history on file.     Family History  Problem  Relation Age of Onset  . Stroke Father   . High blood pressure Father   . Bladder Cancer Mother   . Diabetes Son     Social History   Tobacco Use  . Smoking status: Current Some Day Smoker    Packs/day: 1.00    Types: Cigarettes  . Smokeless tobacco: Current User  Substance Use Topics  . Alcohol use: No    Alcohol/week: 0.0 standard drinks  . Drug use: No    Home Medications Prior to Admission medications   Medication Sig Start Date End Date Taking? Authorizing Provider  amLODipine (NORVASC) 5 MG tablet Take 2 tablets (10 mg total) by mouth daily. 09/07/15   Marvel Plan, MD  aspirin 325 MG tablet Take 1 tablet (325 mg total) by mouth daily. 06/22/15   Joseph Art, DO  atorvastatin (LIPITOR) 80 MG tablet  12/07/15   [provider]  doxazosin (CARDURA) 4 MG tablet Take 8 mg by mouth daily.  05/18/15   [provider]  DULoxetine (CYMBALTA) 30 MG capsule Take 30 mg daily by mouth.    [provider]  escitalopram (LEXAPRO) 10 MG tablet  12/07/15   [provider]  hydrochlorothiazide (HYDRODIURIL) 12.5 MG tablet Take 12.5 mg by mouth daily. 05/18/15   [provider]  Multiple Vitamin (MULTIVITAMIN) tablet Take  1 tablet by mouth daily.    [provider]  omeprazole (PRILOSEC) 20 MG capsule Take 20 mg daily by mouth.    [provider]    Allergies    Penicillins and Sulfa antibiotics  Review of Systems   Review of Systems  Constitutional: Negative for chills and fever.  HENT: Positive for congestion. Negative for rhinorrhea.   Eyes: Negative for redness and visual disturbance.  Respiratory: Positive for cough and shortness of breath (mild). Negative for wheezing.   Cardiovascular: Negative for chest pain and palpitations.  Gastrointestinal: Negative for nausea and vomiting.  Genitourinary: Negative for dysuria and urgency.  Musculoskeletal: Negative for arthralgias and myalgias.  Skin: Negative for pallor and  wound.  Neurological: Negative for dizziness and headaches.    Physical Exam Updated Vital Signs BP 127/83   Pulse (!) 109   Temp 99.3 F (37.4 C) (Oral)   Resp (!) 24   SpO2 96%   Physical Exam Vitals and nursing note reviewed.  Constitutional:      General: She is not in acute distress.    Appearance: She is well-developed. She is not diaphoretic.  HENT:     Head: Normocephalic and atraumatic.  Eyes:     Pupils: Pupils are equal, round, and reactive to light.  Neck:     Vascular: JVD (just above the cavicles) present.  Cardiovascular:     Rate and Rhythm: Normal rate and regular rhythm.     Heart sounds: No murmur heard.  No friction rub. No gallop.   Pulmonary:     Effort: Pulmonary effort is normal.     Breath sounds: Rales (faint at bases worse R than L) present. No wheezing.  Abdominal:     General: There is no distension.     Palpations: Abdomen is soft.     Tenderness: There is no abdominal tenderness.  Musculoskeletal:        General: No tenderness.     Cervical back: Normal range of motion and neck supple.  Skin:    General: Skin is warm and dry.  Neurological:     Mental Status: She is alert and oriented to person, place, and time.  Psychiatric:        Behavior: Behavior normal.     ED Results / Procedures / Treatments   Labs (all labs ordered are listed, but only abnormal results are displayed) Labs Reviewed  BASIC METABOLIC PANEL - Abnormal; Notable for the following components:      Result Value   Potassium 3.4 (*)    Glucose, Bld 112 (*)    All other components within normal limits  CBC - Abnormal; Notable for the following components:   WBC 11.3 (*)    All other components within normal limits  SARS CORONAVIRUS 2 BY RT PCR (HOSPITAL ORDER, PERFORMED IN [facility] [place] [facility])  BRAIN NATRIURETIC PEPTIDE  TROPONIN I (HIGH SENSITIVITY)  TROPONIN I (HIGH SENSITIVITY)    EKG EKG Interpretation  Date/Time:  Tuesday July 18 2020  08:04:31 EDT Ventricular Rate:  99 PR Interval:  188 QRS Duration: 86 QT Interval:  348 QTC Calculation: 446 R Axis:   61 Text Interpretation: Normal sinus rhythm Anterior infarct , age undetermined Abnormal ECG Baseline wander TECHNICALLY DIFFICULT Otherwise no significant change Confirmed by Melene Plan [phone]) on 07/18/2020 8:59:24 AM   Radiology CT Angio Chest PE W and/or Wo Contrast  Result Date: 07/18/2020 CLINICAL DATA:  Shortness of breath, body aches, fever EXAM: CT ANGIOGRAPHY  CHEST WITH CONTRAST TECHNIQUE: Multidetector CT imaging of the chest was performed using the standard protocol during bolus administration of intravenous contrast. Multiplanar CT image reconstructions and MIPs were obtained to evaluate the vascular anatomy. CONTRAST:  35mL OMNIPAQUE IOHEXOL 350 MG/ML SOLN COMPARISON:  07/18/2020 chest x-ray FINDINGS: Cardiovascular: No filling defects in the pulmonary arteries to suggest pulmonary emboli. Aortic atherosclerosis. No evidence of aortic aneurysm. Mediastinum/Nodes: Mildly prominent mediastinal lymph nodes. AP window lymph node has a short axis diameter of 17 mm. Subcarinal lymph node has a short axis diameter of 11 mm. No hilar or axillary adenopathy. Lungs/Pleura: Partially calcified nodule in the peripheral right upper lobe measuring 5 mm, likely granuloma. Dependent atelectasis in the lower lobes. No confluent opacities otherwise or effusions. 9 mm nodule in the right middle lobe on image 93. Upper Abdomen: Imaging into the upper abdomen demonstrates no acute findings. Musculoskeletal: Chest wall soft tissues are unremarkable. No acute bony abnormality. Review of the MIP images confirms the above findings. IMPRESSION: No evidence of pulmonary embolus. Cardiomegaly. Right lung nodules as above. The right upper lobe nodule may have early calcifications centrally. Non-contrast chest CT at 3-6 months is recommended. If the nodules are stable at time of repeat CT, then future  CT at 18-24 months (from today's scan) is considered optional for low-risk patients, but is recommended for high-risk patients. This recommendation follows the consensus statement: Guidelines for Management of Incidental Pulmonary Nodules Detected on CT Images: From the Fleischner Society 2017; Radiology 2017; 284:228-243. Mildly prominent mediastinal lymph nodes, likely reactive. Recommend attention on follow-up imaging. Aortic Atherosclerosis (ICD10-I70.0). Electronically Signed   By: Charlett Nose M.D.   On: 07/18/2020 10:30   DG Chest Portable 1 View  Result Date: 07/18/2020 CLINICAL DATA:  Shortness of breath EXAM: PORTABLE CHEST 1 VIEW COMPARISON:  07/17/2015 FINDINGS: The heart size and mediastinal contours are within normal limits. Both lungs are clear. The visualized skeletal structures are unremarkable. IMPRESSION: No active disease. Electronically Signed   By: Charlett Nose M.D.   On: 07/18/2020 08:43    Procedures Procedures (including critical care time)  Medications Ordered in ED Medications  iohexol (OMNIPAQUE) 350 MG/ML injection 75 mL (66 mLs Intravenous Contrast Given 07/18/20 1020)    ED Course  I have reviewed the triage vital signs and the nursing notes.  Pertinent labs & imaging results that were available during my care of the patient were reviewed by me and considered in my medical decision making (see chart for details).    MDM Rules/Calculators/A&P                          69 yo F with a cc of shortness of breath.  Going on over the course of the week.  Worsening last night with a significant cough.  Found to be newly hypoxic in the mid 80s on room air.  Improved with 2 L of oxygen.  Chest x-ray viewed by me with some mild interstitial prominence.  She has some signs of increased fluid on exam the not so much that I would expect her to be hypoxic.  Will obtain a laboratory evaluation.  Covid test.  Admit.  Covid -, CTA chest without PE or pna.  Discuss with medicine  for admission.  CRITICAL CARE Performed by: Rae Roam   Total critical care time: 35 minutes  Critical care time was exclusive of separately billable procedures and treating other patients.  Critical care was necessary  to treat or prevent imminent or life-threatening deterioration.  Critical care was time spent personally by me on the following activities: development of treatment plan with patient and/or surrogate as well as nursing, discussions with consultants, evaluation of patient's response to treatment, examination of patient, obtaining history from patient or surrogate, ordering and performing treatments and interventions, ordering and review of laboratory studies, ordering and review of radiographic studies, pulse oximetry and re-evaluation of patient's condition.  The patients results and plan were reviewed and discussed.   Any x-rays performed were independently reviewed by myself.   Differential diagnosis were considered with the presenting HPI.  Medications  iohexol (OMNIPAQUE) 350 MG/ML injection 75 mL (66 mLs Intravenous Contrast Given 07/18/20 1020)    Vitals:   07/18/20 0809 07/18/20 0810 07/18/20 0845 07/18/20 0846  BP: 127/83     Pulse: (!) 109     Resp: (!) 24     Temp: 99.3 F (37.4 C)     TempSrc: Oral     SpO2: (!) 86% 94% 96% 96%    Final diagnoses:  Acute respiratory failure with hypoxia (HCC)  Hypoxia    Admission/ observation were discussed with the admitting physician, patient and/or family and they are comfortable with the plan.    Final Clinical Impression(s) / ED Diagnoses Final diagnoses:  Acute respiratory failure with hypoxia [facility](HCC)  Hypoxia    Rx / DC Orders ED Discharge Orders    None       Melene PlanFloyd, [place], DO 07/18/20 1037

## 2020-07-18 NOTE — ED Triage Notes (Signed)
Pt reports having pneumonia and shingles vaccine last Wednesday. Pt initially thought she was having allergic reaction. Reports severe sob since yesterday evening, reports cough but denies swelling, bodyaches, fever. spo2 87% on arrival.

## 2020-07-18 NOTE — ED Notes (Signed)
Got patient into a gown on the monitor patient is resting with call bell in reach 

## 2020-07-18 NOTE — H&P (Signed)
History and Physical    Catherine Floyd UJW:[id number] DOB: September 04, 1951 DOA: 07/18/2020  Referring MD/NP/PA: Melene Plan, MD PCP: System, Pcp Not In  Patient coming from:   Chief Complaint: Couldn't breathe  I have personally briefly reviewed patient's old medical records in [facility]   HPI: Catherine Floyd is a 69 y.o. female with medical history significant of hypertension, stroke, and tobacco use presents with complaints of shortness of breath which started yesterday while she was at work.  Reported feeling like she was unable to take a deep breath and had a productive cough.  Noted associated symptoms of wheezing and itching.  Denied having any significant fever, chills, nausea, vomiting, myalgias, or diarrhea.  She tried utilizing her albuterol inhaler, but it did not give her any relief from her symptoms.   Denies having any chest pain, leg swelling, calf pain, rash, or recent travel.  Patient had just received her pneumonia and shingles vaccine 6 days ago in her PCP office and thought possibly symptoms were related to allergic reaction as she had itching of her legs.  During her visit at that time she had complained of some urinary frequency and burning with urination for which she was started on Macrobid, but reports those symptoms have now improved.  She does admit to smoking a pack cigarettes per day on average, but has never had any kind of shoes where she needed to be on oxygen.  She had already received her COVID-19 vaccine earlier this year.  ED Course: On arrival into the emergency department patient was seen to be afebrile, pulse 68-1 09, respiration 23-24, blood pressures maintained, and O2 saturations noted as low as 86% with improvement on 2 L nasal cannula oxygen.  COVID-19 screening was negative.  Labs significant for WBC 11.3, potassium 3.4, BNP 23.8, and high-sensitivity troponin negative x2.  Chest x-ray was negative for any acute abnormalities.  CTA of the chest  did not show any signs of a pulmonary embolus.  Patient was noted to have right lung nodules for which a repeat noncontrast CT was recommended in 3 to 6 months.  TRH was called to admit.  Review of Systems  Constitutional: Negative for fever.  HENT: Negative for congestion and nosebleeds.   Eyes: Negative for photophobia and pain.  Respiratory: Positive for cough, sputum production, shortness of breath and wheezing.   Cardiovascular: Negative for chest pain, palpitations and leg swelling.  Gastrointestinal: Negative for abdominal pain, diarrhea, nausea and vomiting.  Genitourinary: Negative for dysuria and frequency.  Musculoskeletal: Negative for joint pain and myalgias.  Skin: Positive for itching. Negative for rash.  Neurological: Negative for focal weakness and loss of consciousness.  Psychiatric/Behavioral: Positive for substance abuse.    Past Medical History:  Diagnosis Date  . Cigarette nicotine dependence   . Hypertension   . Stroke [facility])     Past Surgical History:  Procedure Laterality Date  . ABDOMINAL HYSTERECTOMY    . APPENDECTOMY    . TUBAL LIGATION       reports that she has been smoking cigarettes. She has been smoking about 1.00 pack per day. She uses smokeless tobacco. She reports that she does not drink alcohol and does not use drugs.  Allergies  Allergen Reactions  . Penicillins     Throat closes up   . Sulfa Antibiotics     Hives     Family History  Problem Relation Age of Onset  . Stroke Father   . High blood  pressure Father   . Bladder Cancer Mother   . Diabetes Son     Prior to Admission medications   Medication Sig Start Date End Date Taking? Authorizing Provider  albuterol (VENTOLIN HFA) 108 (90 Base) MCG/ACT inhaler Inhale 1-2 puffs into the lungs every 6 (six) hours as needed for wheezing or shortness of breath.  07/17/20  Yes [provider]  amLODipine (NORVASC) 5 MG tablet Take 2 tablets (10 mg total) by mouth daily. Patient  taking differently: Take 5 mg by mouth daily.  09/07/15  Yes Marvel PlanXu, [name], MD  aspirin 325 MG tablet Take 1 tablet (325 mg total) by mouth daily. 06/22/15  Yes Vann, Jessica U, DO  atorvastatin (LIPITOR) 80 MG tablet Take 80 mg by mouth daily.  12/07/15  Yes [provider]  doxazosin (CARDURA) 4 MG tablet Take 4 mg by mouth daily.  05/18/15  Yes [provider]  DULoxetine (CYMBALTA) 30 MG capsule Take 30 mg daily by mouth.   Yes [provider]  escitalopram (LEXAPRO) 10 MG tablet Take 10 mg by mouth daily.  12/07/15  Yes [provider]  hydrochlorothiazide (HYDRODIURIL) 12.5 MG tablet Take 12.5 mg by mouth daily. 05/18/15  Yes [provider]  Multiple Vitamin (MULTIVITAMIN) tablet Take 1 tablet by mouth daily.   Yes [provider]  nitrofurantoin, macrocrystal-monohydrate, (MACROBID) 100 MG capsule Take 100 mg by mouth 2 (two) times daily.  07/12/20 07/19/20 Yes [provider]  omeprazole (PRILOSEC) 20 MG capsule Take 20 mg daily by mouth.   Yes [provider]  potassium chloride SA (KLOR-CON) 20 MEQ tablet Take 20 mEq by mouth daily.  07/17/20  Yes [provider]  valACYclovir (VALTREX) 1000 MG tablet Take 1 tablet by mouth daily. 07/05/20  Yes [provider]    Physical Exam:  Constitutional: Older female who appears to be in no acute distress Vitals:   07/18/20 0845 07/18/20 0846 07/18/20 1000 07/18/20 1100  BP:   131/66 125/66  Pulse:   70 68  Resp:   (!) 24 (!) 23  Temp:      TempSrc:      SpO2: 96% 96% 100% 100%   Eyes: PERRL, lids and conjunctivae normal ENMT: Mucous membranes are moist. Posterior pharynx clear of any exudate or lesions.   Neck: normal, supple, no masses, no thyromegaly Respiratory: clear to auscultation bilaterally, no wheezing, no crackles. Normal respiratory effort. No accessory muscle use.  Cardiovascular: Regular rate and rhythm, no murmurs / rubs / gallops. No extremity  edema. 2+ pedal pulses. No carotid bruits.  Abdomen: no tenderness, no masses palpated. No hepatosplenomegaly. Bowel sounds positive.  Musculoskeletal: no clubbing / cyanosis. No joint deformity upper and lower extremities. Good ROM, no contractures. Normal muscle tone.  Skin: no rashes, lesions, ulcers. No induration Neurologic: CN 2-12 grossly intact. Sensation intact, DTR normal. Strength 5/5 in all 4.  Psychiatric: Normal judgment and insight. Alert and oriented x 3. Normal mood.     Labs on Admission: I have personally reviewed following labs and imaging studies  CBC: Recent Labs  Lab 07/18/20 0814  WBC 11.3*  HGB 13.3  HCT 41.1  MCV 96.5  PLT 289   Basic Metabolic Panel: Recent Labs  Lab 07/18/20 0814  NA 137  K 3.4*  CL 98  CO2 28  GLUCOSE 112*  BUN 9  CREATININE 0.68  CALCIUM 9.5   GFR: CrCl cannot be calculated (Unknown ideal weight.). Liver Function Tests: No results for  input(s): AST, ALT, ALKPHOS, BILITOT, PROT, ALBUMIN in the last 168 hours. No results for input(s): LIPASE, AMYLASE in the last 168 hours. No results for input(s): AMMONIA in the last 168 hours. Coagulation Profile: No results for input(s): INR, PROTIME in the last 168 hours. Cardiac Enzymes: No results for input(s): CKTOTAL, CKMB, CKMBINDEX, TROPONINI in the last 168 hours. BNP (last 3 results) No results for input(s): PROBNP in the last 8760 hours. HbA1C: No results for input(s): HGBA1C in the last 72 hours. CBG: No results for input(s): GLUCAP in the last 168 hours. Lipid Profile: No results for input(s): CHOL, HDL, LDLCALC, TRIG, CHOLHDL, LDLDIRECT in the last 72 hours. Thyroid Function Tests: No results for input(s): TSH, T4TOTAL, FREET4, T3FREE, THYROIDAB in the last 72 hours. Anemia Panel: No results for input(s): VITAMINB12, FOLATE, FERRITIN, TIBC, IRON, RETICCTPCT in the last 72 hours. Urine analysis:    Component Value Date/Time   COLORURINE YELLOW 06/19/2015 1615    APPEARANCEUR CLEAR 06/19/2015 1615   LABSPEC 1.011 06/19/2015 1615   PHURINE 6.5 06/19/2015 1615   GLUCOSEU NEGATIVE 06/19/2015 1615   HGBUR MODERATE (A) 06/19/2015 1615   BILIRUBINUR NEGATIVE 06/19/2015 1615   KETONESUR NEGATIVE 06/19/2015 1615   PROTEINUR NEGATIVE 06/19/2015 1615   UROBILINOGEN 1.0 06/19/2015 1615   NITRITE NEGATIVE 06/19/2015 1615   LEUKOCYTESUR NEGATIVE 06/19/2015 1615   Sepsis Labs: Recent Results (from the past 240 hour(s))  SARS Coronavirus 2 by RT PCR (hospital order, performed in [facility] hospital lab) Nasopharyngeal Nasopharyngeal Swab     Status: None   Collection Time: 07/18/20  8:14 AM   Specimen: Nasopharyngeal Swab  Result Value Ref Range Status   SARS Coronavirus 2 NEGATIVE NEGATIVE Final    Comment: (NOTE) SARS-CoV-2 target nucleic acids are NOT DETECTED.  The SARS-CoV-2 RNA is generally detectable in upper and lower respiratory specimens during the acute phase of infection. The lowest concentration of SARS-CoV-2 viral copies this assay can detect is 250 copies / mL. A negative result does not preclude SARS-CoV-2 infection and should not be used as the sole basis for treatment or other patient management decisions.  A negative result may occur with improper specimen collection / handling, submission of specimen other than nasopharyngeal swab, presence of viral mutation(s) within the areas targeted by this assay, and inadequate number of viral copies (<250 copies / mL). A negative result must be combined with clinical observations, patient history, and epidemiological information.  Fact Sheet for Patients:   BoilerBrush.com.cy  Fact Sheet for Healthcare Providers: https://pope.com/  This test is not yet approved or  cleared by the [place] FDA and has been authorized for detection and/or diagnosis of SARS-CoV-2 by FDA under an Emergency Use Authorization (EUA).  This EUA will remain in  effect (meaning this test can be used) for the duration of the COVID-19 declaration under Section 564(b)(1) of the Act, 21 U.S.C. section 360bbb-3(b)(1), unless the authorization is terminated or revoked sooner.  Performed at [facility], [address] [place], [place] [address]      Radiological Exams on Admission: CT Angio Chest PE W and/or Wo Contrast  Result Date: 07/18/2020 CLINICAL DATA:  Shortness of breath, body aches, fever EXAM: CT ANGIOGRAPHY CHEST WITH CONTRAST TECHNIQUE: Multidetector CT imaging of the chest was performed using the standard protocol during bolus administration of intravenous contrast. Multiplanar CT image reconstructions and MIPs were obtained to evaluate the vascular anatomy. CONTRAST:  61mL OMNIPAQUE IOHEXOL 350 MG/ML SOLN COMPARISON:  07/18/2020 chest x-ray FINDINGS:  Cardiovascular: No filling defects in the pulmonary arteries to suggest pulmonary emboli. Aortic atherosclerosis. No evidence of aortic aneurysm. Mediastinum/Nodes: Mildly prominent mediastinal lymph nodes. AP window lymph node has a short axis diameter of 17 mm. Subcarinal lymph node has a short axis diameter of 11 mm. No hilar or axillary adenopathy. Lungs/Pleura: Partially calcified nodule in the peripheral right upper lobe measuring 5 mm, likely granuloma. Dependent atelectasis in the lower lobes. No confluent opacities otherwise or effusions. 9 mm nodule in the right middle lobe on image 93. Upper Abdomen: Imaging into the upper abdomen demonstrates no acute findings. Musculoskeletal: Chest wall soft tissues are unremarkable. No acute bony abnormality. Review of the MIP images confirms the above findings. IMPRESSION: No evidence of pulmonary embolus. Cardiomegaly. Right lung nodules as above. The right upper lobe nodule may have early calcifications centrally. Non-contrast chest CT at 3-6 months is recommended. If the nodules are stable at time of repeat CT, then future CT at 18-24 months (from  today's scan) is considered optional for low-risk patients, but is recommended for high-risk patients. This recommendation follows the consensus statement: Guidelines for Management of Incidental Pulmonary Nodules Detected on CT Images: From the Fleischner Society 2017; Radiology 2017; 284:228-243. Mildly prominent mediastinal lymph nodes, likely reactive. Recommend attention on follow-up imaging. Aortic Atherosclerosis (ICD10-I70.0). Electronically Signed   By: Charlett Nose M.D.   On: 07/18/2020 10:30   DG Chest Portable 1 View  Result Date: 07/18/2020 CLINICAL DATA:  Shortness of breath EXAM: PORTABLE CHEST 1 VIEW COMPARISON:  07/17/2015 FINDINGS: The heart size and mediastinal contours are within normal limits. Both lungs are clear. The visualized skeletal structures are unremarkable. IMPRESSION: No active disease. Electronically Signed   By: Charlett Nose M.D.   On: 07/18/2020 08:43    EKG: Independently reviewed.  Normal sinus rhythm at 99 bpm  Assessment/Plan Acute respiratory failure with hypoxia secondary to acute bronchitis: Patient presents with complaints of shortness of breath a productive cough that started yesterday.  O2 saturations noted as low as 86% on room air with improvement on 2 L nasal cannula oxygen.  On physical exam patient with mild expiratory wheezes appreciated.  CTA of the chest negative for any signs of PE or acute infiltrate, but did note prominent mediastinal lymph nodes.  Given patient's history of smoking question possibility of acute bronchitis versus upper respiratory illness.  Patient may benefit from outpatient pulmonary function testing in the outpatient setting -Admit to a medical telemetry bed -Continuous pulse oximetry with nasal cannula oxygen to maintain O2 saturations above 90% -Wean oxygen as tolerated -Check respiratory virus panel -Solu-Medrol 125 mg IV x1 dose, and start prednisone 40 mg daily -DuoNebs 4 times daily  SIRS/leukocytosis: WBC elevated at  11.3.  Patient reports recently being treated for UTI. -Check blood cultures and urinalysis -Empiric antibiotics of Rocephin IV  Recent UTI: Patient was on a 7-day course of Macrobid. -Held Macrobid -Follow-up urinalysis  Hypokalemia: Acute.  Potassium is mildly low at 3.4.  Patient on diuretics and normally on potassium replacement of 20 mEq daily. -Continue home regimen -Adjust regimen if needed  Essential hypertension: Home blood pressure medications include amlodipine 5 mg daily, Cardura 4 mg daily, and hydrochlorothiazide 12.5 mg daily. -Continue current home regimen as tolerated  Right lung nodules: Incidental findings of right pulmonary nodules noted on CT scan. -Recommend repeat CT scan in 3 to 6 months to follow-up  Hyperlipidemia -Continue atorvastatin  History of CVA -Continue statin and statin  Tobacco abuse: Patient reports  smoking a pack cigarettes per day on average. -Nicotine patch offered    DVT prophylaxis: Lovenox Code Status: Full Family Communication: Patient stated that she would update her family Disposition Plan: Possible discharge home in 1 to 2 days Consults called: None Admission status: Inpatient status requiring more than 2 midnights due to new oxygen requirement  Clydie Braun MD [facility] Pager [phone]   If 7PM-7AM, please contact night-coverage [email or web address] Password [facility]  07/18/2020, 12:28 PM

## 2020-07-19 DIAGNOSIS — Z720 Tobacco use: Secondary | ICD-10-CM | POA: Diagnosis not present

## 2020-07-19 DIAGNOSIS — J40 Bronchitis, not specified as acute or chronic: Secondary | ICD-10-CM | POA: Diagnosis not present

## 2020-07-19 MED ORDER — PREDNISONE 10 MG PO TABS: ORAL_TABLET | ORAL | 0 refills | Status: AC

## 2020-07-19 MED ORDER — NICOTINE 21 MG/24HR TD PT24: 21.0000 mg | MEDICATED_PATCH | Freq: Every day | TRANSDERMAL | 0 refills | Status: AC

## 2020-07-19 MED ORDER — DOXYCYCLINE HYCLATE 100 MG PO TABS: 100.0000 mg | ORAL_TABLET | Freq: Two times a day (BID) | ORAL | 0 refills | Status: AC

## 2020-07-19 NOTE — ED Notes (Signed)
Patient verbalizes understanding of discharge instructions. Prescriptions discussed with pt. Opportunity for questioning and answers were provided. IV removed by staff, pt discharged from ED VIA wheelchair to lobby with family member on the way.

## 2020-07-19 NOTE — ED Notes (Signed)
RN Case Manager at bedside.

## 2020-07-19 NOTE — Care Management Obs Status (Signed)
MEDICARE OBSERVATION STATUS NOTIFICATION   Patient Details  Name: Catherine Floyd MRN: [id number] Date of Birth: 1951-01-26   Medicare Observation Status Notification Given:  Yes    Oletta Cohn, RN 07/19/2020, 3:53 PM

## 2020-07-19 NOTE — Progress Notes (Signed)
   07/19/20 1656  TOC ED Mini Assessment  TOC Time spent with patient (minutes): 15  PING Used in TOC Assessment No  Admission or Readmission Diverted No  What brought you to the Emergency Department?  Patient was observation status and discharged from the ED  Barriers to Discharge No Barriers Identified  Choice offered to / list presented to  Patient  ED CM met with patient at bedside to discuss the change of admission status from Inpt to Obs, patient is aware discussed with daytime ED CM. Also discussed [place] services and the benefits when transitioning from hospital setting.  Patient declined [place] services at this time, CM made her aware that if she should change her mind she can contact her PCP to have it arranged patient verbalized understanding teach back done. Patient is requesting a work note, CM informed ED RN.  No further ED CM needs identified

## 2020-07-19 NOTE — Care Management CC44 (Signed)
Condition Code 44 Documentation Completed  Patient Details  Name: Catherine Floyd MRN: [id number] Date of Birth: 1950/12/07   Condition Code 44 given:  Yes Patient signature on Condition Code 44 notice:  Yes Documentation of 2 MD's agreement:  Yes Code 44 added to claim:  Yes    Oletta Cohn, RN 07/19/2020, 3:53 PM

## 2020-07-19 NOTE — ED Notes (Signed)
Pt. SpO2 whilre resting 98% and maintained while ambulating. Pt denies SOB/CP

## 2020-07-19 NOTE — ED Notes (Signed)
Confirmed with Dr.Patel pt is ready for discharge has receive code 44 paperwork from case manager RN and was able to ambulate in room without oxygen. Dr. Allena Katz informed RN able to print AVS and discharge pt at this time

## 2020-07-19 NOTE — Discharge Summary (Signed)
[facility] Discharge Summary   Patient: Catherine Floyd OQH:[id number]  PCP: Pcp, No  Date of admission: 07/18/2020   Date of discharge: 07/19/2020      Discharge Diagnoses:  Principal Problem:   Bronchitis due to tobacco use Active Problems:   Essential hypertension   Hypokalemia   Tobacco use disorder   Acute respiratory failure with hypoxia (HCC)   History of CVA (cerebrovascular accident)  Admitted From: Home Disposition:  Home   Recommendations for Outpatient Follow-up:  1. PCP: Follow-up with PCP in 1 week 2. Follow up LABS/TEST: None   Follow-up Information    PCP. Schedule an appointment as soon as possible for a visit in 1 week(s).              Diet recommendation: Cardiac diet  Activity: The patient is advised to gradually reintroduce usual activities, as tolerated  Discharge Condition: stable  Code Status: Full code   History of present illness: As per the H and P dictated on admission, "Catherine Floyd is a 69 y.o. female with medical history significant of hypertension, stroke, and tobacco use presents with complaints of shortness of breath which started yesterday while she was at work.  Reported feeling like she was unable to take a deep breath and had a productive cough.  Noted associated symptoms of wheezing and itching.  Denied having any significant fever, chills, nausea, vomiting, myalgias, or diarrhea.  She tried utilizing her albuterol inhaler, but it did not give her any relief from her symptoms.   Denies having any chest pain, leg swelling, calf pain, rash, or recent travel.  Patient had just received her pneumonia and shingles vaccine 6 days ago in her PCP office and thought possibly symptoms were related to allergic reaction as she had itching of her legs.  During her visit at that time she had complained of some urinary frequency and burning with urination for which she was started on Macrobid, but reports those symptoms have now improved.   She does admit to smoking a pack cigarettes per day on average, but has never had any kind of shoes where she needed to be on oxygen.  She had already received her COVID-19 vaccine earlier this year.  ED Course: On arrival into the emergency department patient was seen to be afebrile, pulse 68-1 09, respiration 23-24, blood pressures maintained, and O2 saturations noted as low as 86% with improvement on 2 L nasal cannula oxygen.  COVID-19 screening was negative.  Labs significant for WBC 11.3, potassium 3.4, BNP 23.8, and high-sensitivity troponin negative x2.  Chest x-ray was negative for any acute abnormalities.  CTA of the chest did not show any signs of a pulmonary embolus.  Patient was noted to have right lung nodules for which a repeat noncontrast CT was recommended in 3 to 6 months.  [facility] was called to admit."  Hospital Course:  Summary of her active problems in the hospital is as following. Acute respiratory failure with hypoxia secondary to acute bronchitis: POA. Sepsis POA. With leukocytosis, hypoxia and tachypnea meeting sepsis criteria on admission. Patient presents with complaints of shortness of breath a productive cough that started yesterday. O2 saturations noted as low as 86% on room air with improvement on 2 L nasal cannula oxygen.   Onphysical exam patient with mild expiratory wheezes appreciated. CTA of the chest negative for any signs of PE or acute infiltrate, but did note prominent mediastinal lymph nodes.   Given patient's history of smoking question possibility  of acute bronchitis versus upper respiratory illness.  Patient may benefit from outpatient pulmonary function testing in the outpatient setting Continue prednisone. Continue antibiotics. Albuterol inhaler.  Recent UTI: Patient was on a 7-day course of Macrobid. -Held Macrobid -Follow-up urinalysis  Hypokalemia: Acute.   Corrected. Monitor.  Essential hypertension:  Blood pressure stable. Continue home  regimen.  Right lung nodules:  Incidental findings of right pulmonary nodules noted on CT scan. -Recommend repeat CT scan in 3 to 6 months to follow-up  Hyperlipidemia -Continue atorvastatin  History of CVA -Continue statin and statin  Tobacco abuse:  Patient reports smoking a pack cigarettes per day on average. -Nicotine patch offered  Patient was ambulatory without any assistance. On the day of the discharge the patient's vitals were stable, and no other acute medical condition were reported by patient. the patient was felt safe to be discharge at Home with no therapy needed on discharge.  Consultants: none Procedures: none  Discharge Exam: General: Appear in no distress, no Rash; Oral Mucosa Clear, moist. Cardiovascular: S1 and S2 Present, no Murmur, Respiratory: normal respiratory effort, Bilateral Air entry present and no Crackles, Occasional  wheezes Abdomen: Bowel Sound present, Soft and no tenderness, no hernia Extremities: no Pedal edema, no calf tenderness Neurology: alert and oriented to time, place, and person affect appropriate.  Filed Weights   07/19/20 0329  Weight: 65.8 kg   Vitals:   07/19/20 1800 07/19/20 1836  BP: (!) 116/59   Pulse: 82   Resp: 17   Temp:  98.6 F (37 C)  SpO2: 98%     DISCHARGE MEDICATION: Allergies as of 07/19/2020      Reactions   Penicillins    Throat closes up    Sulfa Antibiotics    Hives       Medication List    TAKE these medications   albuterol 108 (90 Base) MCG/ACT inhaler Commonly known as: VENTOLIN HFA Inhale 1-2 puffs into the lungs every 6 (six) hours as needed for wheezing or shortness of breath.   amLODipine 5 MG tablet Commonly known as: NORVASC Take 2 tablets (10 mg total) by mouth daily. What changed: how much to take   aspirin 325 MG tablet Take 1 tablet (325 mg total) by mouth daily.   atorvastatin 80 MG tablet Commonly known as: LIPITOR Take 80 mg by mouth daily.   doxazosin 4 MG  tablet Commonly known as: CARDURA Take 4 mg by mouth daily.   doxycycline 100 MG tablet Commonly known as: VIBRA-TABS Take 1 tablet (100 mg total) by mouth 2 (two) times daily for 4 days.   DULoxetine 30 MG capsule Commonly known as: CYMBALTA Take 30 mg daily by mouth.   escitalopram 10 MG tablet Commonly known as: LEXAPRO Take 10 mg by mouth daily.   hydrochlorothiazide 12.5 MG tablet Commonly known as: HYDRODIURIL Take 12.5 mg by mouth daily.   multivitamin tablet Take 1 tablet by mouth daily.   nicotine 21 mg/24hr patch Commonly known as: NICODERM CQ - dosed in mg/24 hours Place 1 patch (21 mg total) onto the skin daily. Start taking on: July 20, 2020   nitrofurantoin (macrocrystal-monohydrate) 100 MG capsule Commonly known as: MACROBID Take 100 mg by mouth 2 (two) times daily.   omeprazole 20 MG capsule Commonly known as: PRILOSEC Take 20 mg daily by mouth.   potassium chloride SA 20 MEQ tablet Commonly known as: KLOR-CON Take 20 mEq by mouth daily.   predniSONE 10 MG tablet Commonly known as: DELTASONE  Take 40mg  daily for 3days,Take 30mg  daily for 3days,Take 20mg  daily for 3days,Take 10mg  daily for 3days, then stop   valACYclovir 1000 MG tablet Commonly known as: VALTREX Take 1 tablet by mouth daily.      Allergies  Allergen Reactions  . Penicillins     Throat closes up   . Sulfa Antibiotics     Hives    Discharge Instructions    Diet - low sodium heart healthy   Complete by: As directed    Increase activity slowly   Complete by: As directed       The results of significant diagnostics from this hospitalization (including imaging, microbiology, ancillary and laboratory) are listed below for reference.    Significant Diagnostic Studies: CT Angio Chest PE W and/or Wo Contrast  Result Date: 07/18/2020 CLINICAL DATA:  Shortness of breath, body aches, fever EXAM: CT ANGIOGRAPHY CHEST WITH CONTRAST TECHNIQUE: Multidetector CT imaging of the  chest was performed using the standard protocol during bolus administration of intravenous contrast. Multiplanar CT image reconstructions and MIPs were obtained to evaluate the vascular anatomy. CONTRAST:  93mL OMNIPAQUE IOHEXOL 350 MG/ML SOLN COMPARISON:  07/18/2020 chest x-ray FINDINGS: Cardiovascular: No filling defects in the pulmonary arteries to suggest pulmonary emboli. Aortic atherosclerosis. No evidence of aortic aneurysm. Mediastinum/Nodes: Mildly prominent mediastinal lymph nodes. AP window lymph node has a short axis diameter of 17 mm. Subcarinal lymph node has a short axis diameter of 11 mm. No hilar or axillary adenopathy. Lungs/Pleura: Partially calcified nodule in the peripheral right upper lobe measuring 5 mm, likely granuloma. Dependent atelectasis in the lower lobes. No confluent opacities otherwise or effusions. 9 mm nodule in the right middle lobe on image 93. Upper Abdomen: Imaging into the upper abdomen demonstrates no acute findings. Musculoskeletal: Chest wall soft tissues are unremarkable. No acute bony abnormality. Review of the MIP images confirms the above findings. IMPRESSION: No evidence of pulmonary embolus. Cardiomegaly. Right lung nodules as above. The right upper lobe nodule may have early calcifications centrally. Non-contrast chest CT at 3-6 months is recommended. If the nodules are stable at time of repeat CT, then future CT at 18-24 months (from today's scan) is considered optional for low-risk patients, but is recommended for high-risk patients. This recommendation follows the consensus statement: Guidelines for Management of Incidental Pulmonary Nodules Detected on CT Images: From the Fleischner Society 2017; Radiology 2017; 284:228-243. Mildly prominent mediastinal lymph nodes, likely reactive. Recommend attention on follow-up imaging. Aortic Atherosclerosis (ICD10-I70.0). Electronically Signed   By: 07/20/2020 M.D.   On: 07/18/2020 10:30   DG Chest Portable 1  View  Result Date: 07/18/2020 CLINICAL DATA:  Shortness of breath EXAM: PORTABLE CHEST 1 VIEW COMPARISON:  07/17/2015 FINDINGS: The heart size and mediastinal contours are within normal limits. Both lungs are clear. The visualized skeletal structures are unremarkable. IMPRESSION: No active disease. Electronically Signed   By: Charlett Nose M.D.   On: 07/18/2020 08:43    Microbiology: Recent Results (from the past 240 hour(s))  SARS Coronavirus 2 by RT PCR (hospital order, performed in [facility] hospital lab) Nasopharyngeal Nasopharyngeal Swab     Status: None   Collection Time: 07/18/20  8:14 AM   Specimen: Nasopharyngeal Swab  Result Value Ref Range Status   SARS Coronavirus 2 NEGATIVE NEGATIVE Final    Comment: (NOTE) SARS-CoV-2 target nucleic acids are NOT DETECTED.  The SARS-CoV-2 RNA is generally detectable in upper and lower respiratory specimens during the acute phase of infection. The lowest concentration of  SARS-CoV-2 viral copies this assay can detect is 250 copies / mL. A negative result does not preclude SARS-CoV-2 infection and should not be used as the sole basis for treatment or other patient management decisions.  A negative result may occur with improper specimen collection / handling, submission of specimen other than nasopharyngeal swab, presence of viral mutation(s) within the areas targeted by this assay, and inadequate number of viral copies (<250 copies / mL). A negative result must be combined with clinical observations, patient history, and epidemiological information.  Fact Sheet for Patients:   BoilerBrush.com.cy  Fact Sheet for Healthcare Providers: https://pope.com/  This test is not yet approved or  cleared by the [place] FDA and has been authorized for detection and/or diagnosis of SARS-CoV-2 by FDA under an Emergency Use Authorization (EUA).  This EUA will remain in effect (meaning this test can  be used) for the duration of the COVID-19 declaration under Section 564(b)(1) of the Act, 21 U.S.C. section 360bbb-3(b)(1), unless the authorization is terminated or revoked sooner.  Performed at [facility], [address] [place], [place] [address]   Culture, blood (routine x 2)     Status: None (Preliminary result)   Collection Time: 07/18/20  1:00 PM   Specimen: BLOOD  Result Value Ref Range Status   Specimen Description BLOOD SITE NOT SPECIFIED  Final   Special Requests   Final    BOTTLES DRAWN AEROBIC AND ANAEROBIC Blood Culture adequate volume   Culture   Final    NO GROWTH < 24 HOURS Performed at [facility], [address] [place], [place] [address]    Report Status PENDING  Incomplete  Culture, blood (routine x 2)     Status: None (Preliminary result)   Collection Time: 07/18/20  1:05 PM   Specimen: BLOOD  Result Value Ref Range Status   Specimen Description BLOOD SITE NOT SPECIFIED  Final   Special Requests   Final    BOTTLES DRAWN AEROBIC AND ANAEROBIC Blood Culture adequate volume   Culture   Final    NO GROWTH < 24 HOURS Performed at [facility], [address], [place], [place] [address]    Report Status PENDING  Incomplete  Respiratory Panel by PCR     Status: None   Collection Time: 07/18/20  4:23 PM   Specimen: Nasopharyngeal Swab; Respiratory  Result Value Ref Range Status   Adenovirus NOT DETECTED NOT DETECTED Final   Coronavirus 229E NOT DETECTED NOT DETECTED Final    Comment: (NOTE) The Coronavirus on the Respiratory Panel, DOES NOT test for the novel  Coronavirus (2019 nCoV)    Coronavirus HKU1 NOT DETECTED NOT DETECTED Final   Coronavirus NL63 NOT DETECTED NOT DETECTED Final   Coronavirus OC43 NOT DETECTED NOT DETECTED Final   Metapneumovirus NOT DETECTED NOT DETECTED Final   Rhinovirus / Enterovirus NOT DETECTED NOT DETECTED Final   Influenza A NOT DETECTED NOT DETECTED Final   Influenza B NOT DETECTED NOT DETECTED Final    Parainfluenza Virus 1 NOT DETECTED NOT DETECTED Final   Parainfluenza Virus 2 NOT DETECTED NOT DETECTED Final   Parainfluenza Virus 3 NOT DETECTED NOT DETECTED Final   Parainfluenza Virus 4 NOT DETECTED NOT DETECTED Final   Respiratory Syncytial Virus NOT DETECTED NOT DETECTED Final   Bordetella pertussis NOT DETECTED NOT DETECTED Final   Chlamydophila pneumoniae NOT DETECTED NOT DETECTED Final   Mycoplasma pneumoniae NOT DETECTED NOT DETECTED Final    Comment: Performed  at [facility], [address] [place], [place] [id number]     Labs: CBC: Recent Labs  Lab 07/18/20 0814  WBC 11.3*  HGB 13.3  HCT 41.1  MCV 96.5  PLT 289   Basic Metabolic Panel: Recent Labs  Lab 07/18/20 0814  NA 137  K 3.4*  CL 98  CO2 28  GLUCOSE 112*  BUN 9  CREATININE 0.68  CALCIUM 9.5   Liver Function Tests: No results for input(s): AST, ALT, ALKPHOS, BILITOT, PROT, ALBUMIN in the last 168 hours. No results for input(s): LIPASE, AMYLASE in the last 168 hours. No results for input(s): AMMONIA in the last 168 hours. Cardiac Enzymes: No results for input(s): CKTOTAL, CKMB, CKMBINDEX, TROPONINI in the last 168 hours. BNP (last 3 results) Recent Labs    07/18/20 1000  BNP 23.8   CBG: No results for input(s): GLUCAP in the last 168 hours.  Time spent: 35 minutes  Signed:  Lynden Oxfordranav Zaccary Creech  [facility] 07/19/2020 7:08 PM

## 2020-07-19 NOTE — ED Notes (Signed)
SW/CM Burna Mortimer at bedside earlier. Per conversion pt had refused HH at this time

## 2020-07-23 LAB — CULTURE, BLOOD (ROUTINE X 2)
Culture: NO GROWTH
Culture: NO GROWTH
Special Requests: ADEQUATE
Special Requests: ADEQUATE

## 2023-05-17 ENCOUNTER — Emergency Department (HOSPITAL_COMMUNITY)
Admission: EM | Admit: 2023-05-17 | Discharge: 2023-05-17 | Disposition: A | Discharge status: Home / Self Care | Payer: Medicare Other | Attending: Emergency Medicine | Admitting: Emergency Medicine

## 2023-05-17 ENCOUNTER — Emergency Department (HOSPITAL_COMMUNITY): Payer: Medicare Other

## 2023-05-17 ENCOUNTER — Other Ambulatory Visit: Payer: Self-pay

## 2023-05-17 ENCOUNTER — Encounter (HOSPITAL_COMMUNITY): Payer: Self-pay

## 2023-05-17 DIAGNOSIS — R112 Nausea with vomiting, unspecified: Secondary | ICD-10-CM | POA: Diagnosis not present

## 2023-05-17 DIAGNOSIS — Z7982 Long term (current) use of aspirin: Secondary | ICD-10-CM | POA: Insufficient documentation

## 2023-05-17 DIAGNOSIS — E041 Nontoxic single thyroid nodule: Secondary | ICD-10-CM | POA: Diagnosis not present

## 2023-05-17 DIAGNOSIS — R7401 Elevation of levels of liver transaminase levels: Secondary | ICD-10-CM | POA: Insufficient documentation

## 2023-05-17 DIAGNOSIS — R079 Chest pain, unspecified: Secondary | ICD-10-CM | POA: Diagnosis present

## 2023-05-17 DIAGNOSIS — R911 Solitary pulmonary nodule: Secondary | ICD-10-CM | POA: Diagnosis not present

## 2023-05-17 DIAGNOSIS — D7389 Other diseases of spleen: Secondary | ICD-10-CM | POA: Diagnosis not present

## 2023-05-17 DIAGNOSIS — R1084 Generalized abdominal pain: Secondary | ICD-10-CM

## 2023-05-17 LAB — CBC WITH DIFFERENTIAL/PLATELET
Abs Immature Granulocytes: 0.03 10*3/uL (ref 0.00–0.07)
Basophils Absolute: 0 10*3/uL (ref 0.0–0.1)
Basophils Relative: 1 %
Eosinophils Absolute: 0.4 10*3/uL (ref 0.0–0.5)
Eosinophils Relative: 5 %
HCT: 37.1 % (ref 36.0–46.0)
Hemoglobin: 11.7 g/dL — ABNORMAL LOW (ref 12.0–15.0)
Immature Granulocytes: 0 %
Lymphocytes Relative: 16 %
Lymphs Abs: 1.2 10*3/uL (ref 0.7–4.0)
MCH: 31 pg (ref 26.0–34.0)
MCHC: 31.5 g/dL (ref 30.0–36.0)
MCV: 98.4 fL (ref 80.0–100.0)
Monocytes Absolute: 0.6 10*3/uL (ref 0.1–1.0)
Monocytes Relative: 8 %
Neutro Abs: 5.3 10*3/uL (ref 1.7–7.7)
Neutrophils Relative %: 70 %
Platelets: 206 10*3/uL (ref 150–400)
RBC: 3.77 MIL/uL — ABNORMAL LOW (ref 3.87–5.11)
RDW: 13.2 % (ref 11.5–15.5)
WBC: 7.4 10*3/uL (ref 4.0–10.5)
nRBC: 0 % (ref 0.0–0.2)

## 2023-05-17 LAB — URINALYSIS, W/ REFLEX TO CULTURE (INFECTION SUSPECTED)
Bacteria, UA: NONE SEEN
Bilirubin Urine: NEGATIVE
Glucose, UA: NEGATIVE mg/dL
Ketones, ur: NEGATIVE mg/dL
Leukocytes,Ua: NEGATIVE
Nitrite: NEGATIVE
Protein, ur: NEGATIVE mg/dL
Specific Gravity, Urine: 1.032 — ABNORMAL HIGH (ref 1.005–1.030)
pH: 5 (ref 5.0–8.0)

## 2023-05-17 LAB — COMPREHENSIVE METABOLIC PANEL
ALT: 52 U/L — ABNORMAL HIGH (ref 0–44)
AST: 136 U/L — ABNORMAL HIGH (ref 15–41)
Albumin: 3.3 g/dL — ABNORMAL LOW (ref 3.5–5.0)
Alkaline Phosphatase: 70 U/L (ref 38–126)
Anion gap: 13 (ref 5–15)
BUN: 15 mg/dL (ref 8–23)
CO2: 26 mmol/L (ref 22–32)
Calcium: 9 mg/dL (ref 8.9–10.3)
Chloride: 104 mmol/L (ref 98–111)
Creatinine, Ser: 0.91 mg/dL (ref 0.44–1.00)
GFR, Estimated: >60 mL/min (ref >60)
Glucose, Bld: 117 mg/dL — ABNORMAL HIGH (ref 70–99)
Potassium: 5.1 mmol/L (ref 3.5–5.1)
Sodium: 143 mmol/L (ref 135–145)
Total Bilirubin: 1.3 mg/dL — ABNORMAL HIGH (ref 0.3–1.2)
Total Protein: 5.7 g/dL — ABNORMAL LOW (ref 6.5–8.1)

## 2023-05-17 LAB — LIPASE, BLOOD: Lipase: 39 U/L (ref 11–51)

## 2023-05-17 LAB — TROPONIN I (HIGH SENSITIVITY)
Troponin I (High Sensitivity): 7 ng/L (ref <18)
Troponin I (High Sensitivity): 9 ng/L (ref <18)

## 2023-05-17 MED ORDER — ONDANSETRON 4 MG PO TBDP: 4.0000 mg | ORAL_TABLET | Freq: Three times a day (TID) | ORAL | 0 refills | Status: AC | PRN

## 2023-05-17 MED ORDER — IOHEXOL 350 MG/ML SOLN
75.0000 mL | Freq: Once | INTRAVENOUS | Status: AC | PRN
  Administered 2023-05-17: 75 mL via INTRAVENOUS

## 2023-05-17 NOTE — ED Triage Notes (Signed)
Pt BIBEMS w/ c/o chest pain, inter. N/V. As per pt sx started approx 22:30 when she got home from work

## 2023-05-17 NOTE — Discharge Instructions (Addendum)
Your CT scan showed many findings that we will need to be followed up with your family doctor for further evaluation.  These include a nodule on your lung, your thyroid gland as well as your spleen.    Get rechecked if you develop fevers, trouble breathing, cannot keep fluids down or you have new or concerning symptoms.

## 2023-05-17 NOTE — ED Notes (Signed)
PO challenge completed, no vomiting reported

## 2023-05-17 NOTE — ED Provider Notes (Signed)
Catherine Floyd   CSN: [id number] Arrival date & time: 05/17/23  0135     History  Chief Complaint  Patient presents with   Chest Pain    Catherine Floyd is a 72 y.o. female.  The history is provided by the patient and medical records.  Chest Pain Catherine Floyd is a 72 y.o. female who presents to the Emergency Department complaining of chest pain.  She presents to the emergency department by EMS for evaluation of chest pain that started around 1030 this evening.  She states that this morning she had diarrhea followed by vomiting and chest tightness this evening.  No hematochezia or hematemesis.  She points to her abdomen when she says chest tightness and states it is generalized and overall resolving.  No associated fevers, dysuria.  No known sick contacts.  Symptoms started an hour after returning home from work.  She does have a history of hypertension, no additional medical problems.  Denies any associated shortness of breath, cough.     Home Medications Prior to Admission medications   Medication Sig Start Date End Date Taking? Authorizing Provider  ondansetron (ZOFRAN-ODT) 4 MG disintegrating tablet Take 1 tablet (4 mg total) by mouth every 8 (eight) hours as needed for nausea or vomiting. 05/17/23  Yes Tilden Fossa, MD  albuterol (VENTOLIN HFA) 108 (90 Base) MCG/ACT inhaler Inhale 1-2 puffs into the lungs every 6 (six) hours as needed for wheezing or shortness of breath.  07/17/20   [provider]  amLODipine (NORVASC) 5 MG tablet Take 2 tablets (10 mg total) by mouth daily. Patient taking differently: Take 5 mg by mouth daily.  09/07/15   Marvel Plan, MD  aspirin 325 MG tablet Take 1 tablet (325 mg total) by mouth daily. 06/22/15   Joseph Art, DO  atorvastatin (LIPITOR) 80 MG tablet Take 80 mg by mouth daily.  12/07/15   [provider]  doxazosin (CARDURA) 4 MG tablet Take 4 mg by mouth  daily.  05/18/15   [provider]  DULoxetine (CYMBALTA) 30 MG capsule Take 30 mg daily by mouth.    [provider]  escitalopram (LEXAPRO) 10 MG tablet Take 10 mg by mouth daily.  12/07/15   [provider]  hydrochlorothiazide (HYDRODIURIL) 12.5 MG tablet Take 12.5 mg by mouth daily. 05/18/15   [provider]  Multiple Vitamin (MULTIVITAMIN) tablet Take 1 tablet by mouth daily.    [provider]  nicotine (NICODERM CQ - DOSED IN MG/24 HOURS) 21 mg/24hr patch Place 1 patch (21 mg total) onto the skin daily. 07/20/20   Rolly Salter, MD  omeprazole (PRILOSEC) 20 MG capsule Take 20 mg daily by mouth.    [provider]  potassium chloride SA (KLOR-CON) 20 MEQ tablet Take 20 mEq by mouth daily.  07/17/20   [provider]  predniSONE (DELTASONE) 10 MG tablet Take 40mg  daily for 3days,Take 30mg  daily for 3days,Take 20mg  daily for 3days,Take 10mg  daily for 3days, then stop 07/19/20   Rolly Salter, MD  valACYclovir (VALTREX) 1000 MG tablet Take 1 tablet by mouth daily. 07/05/20   [provider]      Allergies    Penicillins and Sulfa antibiotics    Review of Systems   Review of Systems  Cardiovascular:  Positive for chest pain.  All other systems reviewed and are negative.   Physical Exam Updated Vital Signs BP (!) 131/59 (BP Location: Right  Arm)   Pulse 76   Temp 98.1 F (36.7 C) (Oral)   Resp 19   Ht 5\' 2"  (1.575 m)   Wt 68 kg   SpO2 100%   BMI 27.44 kg/m  Physical Exam Vitals and nursing Floyd reviewed.  Constitutional:      Appearance: She is well-developed.  HENT:     Head: Normocephalic and atraumatic.  Cardiovascular:     Rate and Rhythm: Normal rate and regular rhythm.     Heart sounds: No murmur heard. Pulmonary:     Effort: Pulmonary effort is normal. No respiratory distress.     Breath sounds: Normal breath sounds.  Abdominal:     Palpations: Abdomen is soft.     Tenderness: There is no  abdominal tenderness. There is no guarding or rebound.  Musculoskeletal:        General: No swelling or tenderness.  Skin:    General: Skin is warm and dry.  Neurological:     Mental Status: She is alert and oriented to person, place, and time.  Psychiatric:        Behavior: Behavior normal.     ED Results / Procedures / Treatments   Labs (all labs ordered are listed, but only abnormal results are displayed) Labs Reviewed  COMPREHENSIVE METABOLIC PANEL - Abnormal; Notable for the following components:      Result Value   Glucose, Bld 117 (*)    Total Protein 5.7 (*)    Albumin 3.3 (*)    AST 136 (*)    ALT 52 (*)    Total Bilirubin 1.3 (*)    All other components within normal limits  CBC WITH DIFFERENTIAL/PLATELET - Abnormal; Notable for the following components:   RBC 3.77 (*)    Hemoglobin 11.7 (*)    All other components within normal limits  URINALYSIS, W/ REFLEX TO CULTURE (INFECTION SUSPECTED) - Abnormal; Notable for the following components:   Specific Gravity, Urine 1.032 (*)    Hgb urine dipstick SMALL (*)    All other components within normal limits  LIPASE, BLOOD  TROPONIN I (HIGH SENSITIVITY)  TROPONIN I (HIGH SENSITIVITY)    EKG EKG Interpretation Date/Time:  Saturday May 17 2023 01:42:16 EDT Ventricular Rate:  65 PR Interval:  239 QRS Duration:  106 QT Interval:  451 QTC Calculation: 469 R Axis:   -2  Text Interpretation: Sinus rhythm Atrial premature complex Prolonged PR interval Consider anterior infarct Confirmed by Tilden Fossa [phone]) on 05/17/2023 1:45:24 AM  Radiology CT CHEST ABDOMEN PELVIS W CONTRAST  Result Date: 05/17/2023 CLINICAL DATA:  Chest and abdomen pain with a negative ultrasound today . EXAM: CT CHEST, ABDOMEN, AND PELVIS WITH CONTRAST TECHNIQUE: Multidetector CT imaging of the chest, abdomen and pelvis was performed following the standard protocol during bolus administration of intravenous contrast. RADIATION DOSE REDUCTION:  This exam was performed according to the departmental dose-optimization program which includes automated exposure control, adjustment of the mA and/or kV according to patient size and/or use of iterative reconstruction technique. CONTRAST:  75mL OMNIPAQUE IOHEXOL 350 MG/ML SOLN COMPARISON:  Portable chest today, portable chest 07/18/2020, and CTA chest 07/18/2020. No prior abdomen pelvis CT. Negative ultrasound was performed today. FINDINGS: CT CHEST FINDINGS Cardiovascular: The cardiac size is normal. There is no pericardial effusion. The pulmonary trunk is chronically prominent measuring 3.3 cm but there is no right heart chamber enlargement. The pulmonary arteries are centrally clear. There are trace three-vessel calcifications in the coronary arteries. There is aortic atherosclerosis  without aneurysm, stenosis or dissection with mild tortuosity. There are calcifications in the great vessels, suspected a proximally 70% calcific origin stenosis of the left common carotid artery. No other great vessel stenosis is seen. Mediastinum/Nodes: There is a partially rim calcified 1.5 cm nodule in the left lobe of the thyroid gland. There is a 1.9 cm solid nodule in the lower medial left lobe at the junction with the isthmus. Nonemergent thyroid ultrasound is recommended as follow-up. Unsure if this was present on the prior study since there was no thyroid enhancement on the previous exam. Single mildly prominent left axillary lymph node noted measuring 1.1 cm in short axis. Axillary spaces are otherwise clear. Stable mildly prominent AP window lymph node of 1.3 cm short axis. Borderline prominent precarinal lymph node also unchanged at 0.8 cm in short axis. There are no new or further intrathoracic adenopathy. Thoracic esophagus, thoracic trachea and main bronchi are unremarkable. Lungs/Pleura: The lungs are mildly emphysematous with centrilobular changes predominating and there is diffuse bronchial thickening. There are  scattered tiny centrilobular micronodules in the periphery of the lungs with upper lobe predominance, most likely due to respiratory bronchiolitis in this finding was seen previously. There is a 3 mm calcified granuloma at the left upper lobe apex on 5:25, 5 mm calcified granuloma laterally in the right upper lobe on 5:54. There is a noncalcified nodule in the right middle lobe on 5:86, today measuring 11.1 by 9.4 mm, previously 9.6 x 7.0 mm. Given interval increase size although minimal, would recommend further assessment with PET-CT to evaluate for slow growing neoplasm. There is posterior atelectasis the lung bases and scattered linear scar-like opacities. Remaining lungs are clear. Musculoskeletal: Mild thoracic kypho dextroscoliosis and degenerative change. No acute or significant osseous findings. The visualized chest wall without mass. 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 CT ABDOMEN PELVIS FINDINGS Hepatobiliary: There is mild intrahepatic periportal edema which is probably due to fluid overload. The liver is mildly steatotic measuring 17 cm in length. There is a solitary 6 mm too small to characterize hypodensity posteriorly in hepatic segment 7 on 3:49. Gallbladder and bile ducts and the remaining liver are unremarkable. Pancreas: No abnormality. Spleen: Not seen on the prior study, there are several hypodensities in the substance of the spleen measuring 35-40 Hounsfield units. Largest of these measure 1.4 cm on 3:46, 1.3 cm on this same image, and 1.7 cm more inferiorly on 3:51. MRI without and with contrast recommended. Adrenals/Urinary Tract: There faint areas of decreased enhancement in the upper pole right kidney, unclear if this is related to bolus timing or pyelonephritis. Remainder of the kidneys enhance homogeneously. There is no adrenal mass. There is no urinary stone or obstruction. There is no bladder thickening. Stomach/Bowel: No dilatation  or wall thickening. An appendix is not seen. There is uncomplicated scattered sigmoid diverticulosis. Vascular/Lymphatic: Multiple pelvic phleboliths. Heavy aortoiliac atherosclerosis. No enlarged abdominal or pelvic lymph nodes. Reproductive: Status post hysterectomy. No adnexal masses. Other: No abdominal wall hernia or abnormality. No abdominopelvic ascites. Musculoskeletal: There is osteopenia. At L4-5 there is advanced facet hypertrophy, grade 1 degenerative anterolisthesis and acquired spinal stenosis. No acute or other significant osseous findings. IMPRESSION: 1. 11.1 x 9.4 mm noncalcified right middle lobe nodule, previously 9.6 x 7.0 mm. Recommend PET-CT. 2. COPD, bronchitis and findings compatible with respiratory bronchiolitis. 3. Aortic and coronary artery atherosclerosis. 4. Chronic prominence of the pulmonary trunk but no right heart chamber enlargement. 5. Stable mildly prominent  mediastinal lymph nodes. 6. 1.9 cm solid nodule in the left lobe of the thyroid gland. Nonemergent follow-up ultrasound recommended. 7. 6 mm too small to characterize hypodensity posteriorly in the right lobe of the liver. 8. Multiple splenic hypodensities, not seen on the prior study. MRI without and with contrast recommended. Differential diagnosis includes both benign and malignant etiologies. 9. Faint areas of decreased enhancement in the upper pole right kidney, unclear if this is related to bolus timing or pyelonephritis. 10. Osteopenia and degenerative change. 11. L4-5 degenerative anterolisthesis and acquired spinal stenosis. Aortic Atherosclerosis (ICD10-I70.0). Electronically Signed   By: Almira Bar M.D.   On: 05/17/2023 05:57   US Abdomen Limited RUQ (LIVER/GB)  Result Date: 05/17/2023 CLINICAL DATA:  Abdominal pain. EXAM: ULTRASOUND ABDOMEN LIMITED RIGHT UPPER QUADRANT COMPARISON:  None Available. FINDINGS: Gallbladder: No gallstones or wall thickening visualized (1.8 mm). No sonographic Murphy sign noted  by sonographer. Common bile duct: Diameter: 5.2 mm Liver: No focal lesion identified. Diffusely increased echogenicity of the liver parenchyma is noted. Portal vein is patent on color Doppler imaging with normal direction of blood flow towards the liver. Other: None. IMPRESSION: Hepatic steatosis without focal liver lesions. Electronically Signed   By: Aram Candela M.D.   On: 05/17/2023 03:59   DG Chest Port 1 View  Result Date: 05/17/2023 CLINICAL DATA:  Chest pain EXAM: PORTABLE CHEST 1 VIEW COMPARISON:  07/18/2020 FINDINGS: Cardiac and mediastinal contours are within normal limits given AP technique. 14 mm pulmonary opacity in the lateral left lung, which is new from the prior exam. No other focal pulmonary opacity. No pleural effusion or pneumothorax. No acute osseous abnormality. IMPRESSION: 14 mm pulmonary opacity in the lateral left lung, which is new from the prior exam. This could represent a pulmonary nodule. Recommend further evaluation with chest CT. Electronically Signed   By: Wiliam Ke M.D.   On: 05/17/2023 02:14    Procedures Procedures    Medications Ordered in ED Medications  iohexol (OMNIPAQUE) 350 MG/ML injection 75 mL (75 mLs Intravenous Contrast Given 05/17/23 0446)    ED Course/ Medical Decision Making/ A&P                             Medical Decision Making Amount and/or Complexity of Data Reviewed Labs: ordered. Radiology: ordered.  Risk Prescription drug management.   Patient here for evaluation of abdominal pain, nausea, vomiting, diarrhea today.  No significant tenderness on examination but did have initial tenderness just prior to assessment.  Labs with mild elevation in her transaminases-no alcohol use.  She takes at most 2 acetaminophen daily.  Right upper quadrant ultrasound is negative for acute cholecystitis or obstructing lesion.  Given her reported abdominal pain a CT abdomen pelvis was obtained.  Chest x-ray with lung lesion, CT chest obtained  to further evaluate and rule out pneumonia.  CT chest abdomen pelvis significant for multiple incidental findings but no acute process.  On repeat assessment patient is able to tolerate p.o. and feeling improved.  Discussed with patient and family member at bedside incidental findings of images with pulmonary nodule, thyroid nodule and spleen lesions that will require further evaluation in the outpatient setting.  Discussed home care as well as return precautions for nausea, vomiting, abdominal pain.  Will prescribe ondansetron as needed nausea.        Final Clinical Impression(s) / ED Diagnoses Final diagnoses:  Generalized abdominal pain  Nausea vomiting and diarrhea  Pulmonary nodule  Thyroid nodule  Lesion of spleen    Rx / DC Orders ED Discharge Orders          Ordered    ondansetron (ZOFRAN-ODT) 4 MG disintegrating tablet  Every 8 hours PRN        05/17/23 0720              Tilden Fossa, MD 05/17/23 [phone]

## 2023-10-15 ENCOUNTER — Encounter (HOSPITAL_COMMUNITY): Payer: Self-pay
